# Patient Record
Sex: Female | Born: 1970 | Race: White | Hispanic: Yes | Marital: Single | Smoking: Current some day smoker
Health system: Southern US, Community
[De-identification: ages and names within clinical notes are randomized; demographics above are authoritative.]

## PROBLEM LIST (undated history)

## (undated) DIAGNOSIS — F32A Depression, unspecified: Secondary | ICD-10-CM

## (undated) DIAGNOSIS — C50919 Malignant neoplasm of unspecified site of unspecified female breast: Secondary | ICD-10-CM

## (undated) DIAGNOSIS — A920 Chikungunya virus disease: Secondary | ICD-10-CM

## (undated) DIAGNOSIS — Z923 Personal history of irradiation: Secondary | ICD-10-CM

## (undated) DIAGNOSIS — J302 Other seasonal allergic rhinitis: Secondary | ICD-10-CM

## (undated) DIAGNOSIS — C50212 Malignant neoplasm of upper-inner quadrant of left female breast: Principal | ICD-10-CM

## (undated) DIAGNOSIS — M791 Myalgia, unspecified site: Secondary | ICD-10-CM

## (undated) DIAGNOSIS — F419 Anxiety disorder, unspecified: Secondary | ICD-10-CM

## (undated) DIAGNOSIS — F329 Major depressive disorder, single episode, unspecified: Secondary | ICD-10-CM

## (undated) HISTORY — DX: Depression, unspecified: F32.A

## (undated) HISTORY — PX: TUBAL LIGATION: SHX77

## (undated) HISTORY — DX: Malignant neoplasm of unspecified site of unspecified female breast: C50.919

## (undated) HISTORY — DX: Major depressive disorder, single episode, unspecified: F32.9

## (undated) HISTORY — PX: ABDOMINAL HYSTERECTOMY: SHX81

## (undated) HISTORY — DX: Anxiety disorder, unspecified: F41.9

## (undated) HISTORY — DX: Malignant neoplasm of upper-inner quadrant of left female breast: C50.212

---

## 2004-12-24 ENCOUNTER — Emergency Department (HOSPITAL_COMMUNITY): Admission: EM | Admit: 2004-12-24 | Discharge: 2004-12-24 | Payer: Self-pay | Admitting: Emergency Medicine

## 2007-03-06 ENCOUNTER — Emergency Department (HOSPITAL_COMMUNITY): Admission: EM | Admit: 2007-03-06 | Discharge: 2007-03-06 | Payer: Self-pay | Admitting: Emergency Medicine

## 2009-05-30 ENCOUNTER — Emergency Department (HOSPITAL_COMMUNITY): Admission: EM | Admit: 2009-05-30 | Discharge: 2009-05-30 | Payer: Self-pay | Admitting: Emergency Medicine

## 2009-06-15 ENCOUNTER — Ambulatory Visit (HOSPITAL_COMMUNITY): Admission: RE | Admit: 2009-06-15 | Discharge: 2009-06-15 | Payer: Self-pay | Admitting: Obstetrics

## 2009-07-23 ENCOUNTER — Emergency Department (HOSPITAL_COMMUNITY): Admission: EM | Admit: 2009-07-23 | Discharge: 2009-07-23 | Payer: Self-pay | Admitting: Emergency Medicine

## 2009-08-04 ENCOUNTER — Inpatient Hospital Stay (HOSPITAL_COMMUNITY): Admission: RE | Admit: 2009-08-04 | Discharge: 2009-08-07 | Payer: Self-pay | Admitting: Obstetrics

## 2009-08-04 ENCOUNTER — Encounter: Payer: Self-pay | Admitting: Obstetrics

## 2009-08-29 ENCOUNTER — Emergency Department (HOSPITAL_COMMUNITY): Admission: EM | Admit: 2009-08-29 | Discharge: 2009-08-29 | Payer: Self-pay | Admitting: Emergency Medicine

## 2010-09-02 ENCOUNTER — Emergency Department (HOSPITAL_COMMUNITY)
Admission: EM | Admit: 2010-09-02 | Discharge: 2010-09-02 | Payer: Self-pay | Source: Home / Self Care | Admitting: Emergency Medicine

## 2010-10-05 ENCOUNTER — Emergency Department (HOSPITAL_COMMUNITY)
Admission: EM | Admit: 2010-10-05 | Discharge: 2010-10-05 | Payer: Self-pay | Source: Home / Self Care | Admitting: Emergency Medicine

## 2010-10-22 ENCOUNTER — Emergency Department (HOSPITAL_COMMUNITY)
Admission: EM | Admit: 2010-10-22 | Discharge: 2010-10-22 | Payer: Self-pay | Source: Home / Self Care | Admitting: Emergency Medicine

## 2010-12-23 ENCOUNTER — Emergency Department (HOSPITAL_COMMUNITY): Payer: Medicaid Other

## 2010-12-23 ENCOUNTER — Emergency Department (HOSPITAL_COMMUNITY)
Admission: EM | Admit: 2010-12-23 | Discharge: 2010-12-23 | Disposition: A | Discharge status: Home / Self Care | Payer: Medicaid Other | Attending: Emergency Medicine | Admitting: Emergency Medicine

## 2010-12-23 DIAGNOSIS — J45909 Unspecified asthma, uncomplicated: Secondary | ICD-10-CM | POA: Insufficient documentation

## 2010-12-23 DIAGNOSIS — R300 Dysuria: Secondary | ICD-10-CM | POA: Insufficient documentation

## 2010-12-23 DIAGNOSIS — R059 Cough, unspecified: Secondary | ICD-10-CM | POA: Insufficient documentation

## 2010-12-23 DIAGNOSIS — N390 Urinary tract infection, site not specified: Secondary | ICD-10-CM | POA: Insufficient documentation

## 2010-12-23 DIAGNOSIS — J029 Acute pharyngitis, unspecified: Secondary | ICD-10-CM | POA: Insufficient documentation

## 2010-12-23 DIAGNOSIS — H9209 Otalgia, unspecified ear: Secondary | ICD-10-CM | POA: Insufficient documentation

## 2010-12-23 DIAGNOSIS — J069 Acute upper respiratory infection, unspecified: Secondary | ICD-10-CM | POA: Insufficient documentation

## 2010-12-23 DIAGNOSIS — M545 Low back pain, unspecified: Secondary | ICD-10-CM | POA: Insufficient documentation

## 2010-12-23 DIAGNOSIS — J40 Bronchitis, not specified as acute or chronic: Secondary | ICD-10-CM | POA: Insufficient documentation

## 2010-12-23 DIAGNOSIS — R05 Cough: Secondary | ICD-10-CM | POA: Insufficient documentation

## 2010-12-23 LAB — URINALYSIS, ROUTINE W REFLEX MICROSCOPIC
Ketones, ur: NEGATIVE mg/dL
Nitrite: POSITIVE — AB
Specific Gravity, Urine: 1.017 (ref 1.005–1.030)
Urine Glucose, Fasting: NEGATIVE mg/dL
Urobilinogen, UA: 1 mg/dL (ref 0.0–1.0)

## 2010-12-23 LAB — URINE MICROSCOPIC-ADD ON

## 2011-01-07 LAB — URINALYSIS, ROUTINE W REFLEX MICROSCOPIC
Nitrite: NEGATIVE
Protein, ur: NEGATIVE mg/dL
pH: 5.5 (ref 5.0–8.0)

## 2011-01-07 LAB — URINE CULTURE: Culture  Setup Time: 201112261700

## 2011-01-07 LAB — URINE MICROSCOPIC-ADD ON

## 2011-01-08 LAB — URINALYSIS, ROUTINE W REFLEX MICROSCOPIC
Bilirubin Urine: NEGATIVE
Glucose, UA: NEGATIVE mg/dL
Ketones, ur: NEGATIVE mg/dL
Leukocytes, UA: NEGATIVE
Nitrite: NEGATIVE
Protein, ur: NEGATIVE mg/dL
Specific Gravity, Urine: 1.018 (ref 1.005–1.030)
Urobilinogen, UA: 1 mg/dL (ref 0.0–1.0)
pH: 6 (ref 5.0–8.0)

## 2011-01-08 LAB — URINE MICROSCOPIC-ADD ON

## 2011-01-31 LAB — CBC
HCT: 34.9 % — ABNORMAL LOW (ref 36.0–46.0)
HCT: 39.6 % (ref 36.0–46.0)
Hemoglobin: 11.9 g/dL — ABNORMAL LOW (ref 12.0–15.0)
MCHC: 33.7 g/dL (ref 30.0–36.0)
MCHC: 34.2 g/dL (ref 30.0–36.0)
MCV: 86.1 fL (ref 78.0–100.0)
MCV: 86.2 fL (ref 78.0–100.0)
Platelets: 251 10*3/uL (ref 150–400)
RBC: 4.05 MIL/uL (ref 3.87–5.11)
RBC: 4.6 MIL/uL (ref 3.87–5.11)
RDW: 13.3 % (ref 11.5–15.5)

## 2011-02-01 LAB — DIFFERENTIAL
Basophils Absolute: 0.2 10*3/uL — ABNORMAL HIGH (ref 0.0–0.1)
Eosinophils Absolute: 0.7 10*3/uL (ref 0.0–0.7)
Lymphocytes Relative: 22 % (ref 12–46)
Lymphs Abs: 1.8 10*3/uL (ref 0.7–4.0)
Monocytes Relative: 7 % (ref 3–12)
Neutro Abs: 4.8 10*3/uL (ref 1.7–7.7)
Neutrophils Relative %: 60 % (ref 43–77)

## 2011-02-01 LAB — URINE CULTURE
Colony Count: NO GROWTH
Culture: NO GROWTH

## 2011-02-01 LAB — CBC
HCT: 37.9 % (ref 36.0–46.0)
Hemoglobin: 12.9 g/dL (ref 12.0–15.0)
MCHC: 34 g/dL (ref 30.0–36.0)
MCV: 85 fL (ref 78.0–100.0)
Platelets: 246 K/uL (ref 150–400)
RBC: 4.46 MIL/uL (ref 3.87–5.11)
RDW: 13.3 % (ref 11.5–15.5)
WBC: 8 K/uL (ref 4.0–10.5)

## 2011-02-01 LAB — URINALYSIS, ROUTINE W REFLEX MICROSCOPIC
Glucose, UA: NEGATIVE mg/dL
Nitrite: NEGATIVE
Protein, ur: 100 mg/dL — AB
Specific Gravity, Urine: 1.021 (ref 1.005–1.030)
Urobilinogen, UA: 0.2 mg/dL (ref 0.0–1.0)
pH: 5.5 (ref 5.0–8.0)

## 2011-02-01 LAB — POCT I-STAT, CHEM 8
BUN: 6 mg/dL (ref 6–23)
Calcium, Ion: 1.09 mmol/L — ABNORMAL LOW (ref 1.12–1.32)
Chloride: 108 meq/L (ref 96–112)
Creatinine, Ser: 1 mg/dL (ref 0.4–1.2)
Glucose, Bld: 81 mg/dL (ref 70–99)
HCT: 38 % (ref 36.0–46.0)
Hemoglobin: 12.9 g/dL (ref 12.0–15.0)
Potassium: 4.1 meq/L (ref 3.5–5.1)
Sodium: 137 meq/L (ref 135–145)
TCO2: 21 mmol/L (ref 0–100)

## 2011-02-01 LAB — URINE MICROSCOPIC-ADD ON

## 2011-02-02 LAB — DIFFERENTIAL
Basophils Relative: 1 % (ref 0–1)
Eosinophils Absolute: 0.8 10*3/uL — ABNORMAL HIGH (ref 0.0–0.7)
Lymphs Abs: 1.5 10*3/uL (ref 0.7–4.0)
Neutrophils Relative %: 72 % (ref 43–77)

## 2011-02-02 LAB — URINALYSIS, ROUTINE W REFLEX MICROSCOPIC
Bilirubin Urine: NEGATIVE
Ketones, ur: NEGATIVE mg/dL
Protein, ur: 30 mg/dL — AB
Urobilinogen, UA: 1 mg/dL (ref 0.0–1.0)

## 2011-02-02 LAB — WET PREP, GENITAL: Yeast Wet Prep HPF POC: NONE SEEN

## 2011-02-02 LAB — CBC
MCV: 85.3 fL (ref 78.0–100.0)
Platelets: 256 10*3/uL (ref 150–400)
WBC: 10.4 10*3/uL (ref 4.0–10.5)

## 2011-02-02 LAB — PROTIME-INR
INR: 1 (ref 0.00–1.49)
Prothrombin Time: 13.1 seconds (ref 11.6–15.2)

## 2011-02-02 LAB — ABO/RH: ABO/RH(D): A POS

## 2011-02-02 LAB — URINE MICROSCOPIC-ADD ON

## 2011-03-20 ENCOUNTER — Emergency Department (HOSPITAL_COMMUNITY)
Admission: EM | Admit: 2011-03-20 | Discharge: 2011-03-21 | Disposition: A | Discharge status: Home / Self Care | Payer: Medicaid Other | Attending: Emergency Medicine | Admitting: Emergency Medicine

## 2011-03-20 ENCOUNTER — Emergency Department (HOSPITAL_COMMUNITY): Payer: Medicaid Other

## 2011-03-20 DIAGNOSIS — S4980XA Other specified injuries of shoulder and upper arm, unspecified arm, initial encounter: Secondary | ICD-10-CM | POA: Insufficient documentation

## 2011-03-20 DIAGNOSIS — IMO0002 Reserved for concepts with insufficient information to code with codable children: Secondary | ICD-10-CM | POA: Insufficient documentation

## 2011-03-20 DIAGNOSIS — S46909A Unspecified injury of unspecified muscle, fascia and tendon at shoulder and upper arm level, unspecified arm, initial encounter: Secondary | ICD-10-CM | POA: Insufficient documentation

## 2011-03-20 DIAGNOSIS — M25519 Pain in unspecified shoulder: Secondary | ICD-10-CM | POA: Insufficient documentation

## 2011-03-20 DIAGNOSIS — J45909 Unspecified asthma, uncomplicated: Secondary | ICD-10-CM | POA: Insufficient documentation

## 2011-03-20 DIAGNOSIS — M546 Pain in thoracic spine: Secondary | ICD-10-CM | POA: Insufficient documentation

## 2011-03-20 DIAGNOSIS — M62838 Other muscle spasm: Secondary | ICD-10-CM | POA: Insufficient documentation

## 2011-03-20 DIAGNOSIS — E669 Obesity, unspecified: Secondary | ICD-10-CM | POA: Insufficient documentation

## 2011-03-20 DIAGNOSIS — M542 Cervicalgia: Secondary | ICD-10-CM | POA: Insufficient documentation

## 2011-04-13 ENCOUNTER — Emergency Department (HOSPITAL_COMMUNITY)
Admission: EM | Admit: 2011-04-13 | Discharge: 2011-04-13 | Disposition: A | Discharge status: Home / Self Care | Payer: Medicaid Other | Attending: Emergency Medicine | Admitting: Emergency Medicine

## 2011-04-13 DIAGNOSIS — M25539 Pain in unspecified wrist: Secondary | ICD-10-CM | POA: Insufficient documentation

## 2011-04-13 DIAGNOSIS — J45909 Unspecified asthma, uncomplicated: Secondary | ICD-10-CM | POA: Insufficient documentation

## 2011-04-13 DIAGNOSIS — G5600 Carpal tunnel syndrome, unspecified upper limb: Secondary | ICD-10-CM | POA: Insufficient documentation

## 2011-04-13 DIAGNOSIS — R209 Unspecified disturbances of skin sensation: Secondary | ICD-10-CM | POA: Insufficient documentation

## 2011-05-09 ENCOUNTER — Emergency Department (HOSPITAL_COMMUNITY): Payer: Medicaid Other

## 2011-05-09 ENCOUNTER — Emergency Department (HOSPITAL_COMMUNITY)
Admission: EM | Admit: 2011-05-09 | Discharge: 2011-05-09 | Disposition: A | Discharge status: Home / Self Care | Payer: Medicaid Other | Attending: Emergency Medicine | Admitting: Emergency Medicine

## 2011-05-09 DIAGNOSIS — E669 Obesity, unspecified: Secondary | ICD-10-CM | POA: Insufficient documentation

## 2011-05-09 DIAGNOSIS — J45909 Unspecified asthma, uncomplicated: Secondary | ICD-10-CM | POA: Insufficient documentation

## 2011-05-09 DIAGNOSIS — M25539 Pain in unspecified wrist: Secondary | ICD-10-CM | POA: Insufficient documentation

## 2011-05-09 DIAGNOSIS — G5600 Carpal tunnel syndrome, unspecified upper limb: Secondary | ICD-10-CM | POA: Insufficient documentation

## 2011-05-25 ENCOUNTER — Emergency Department (HOSPITAL_COMMUNITY)
Admission: EM | Admit: 2011-05-25 | Discharge: 2011-05-25 | Disposition: A | Discharge status: Home / Self Care | Payer: Medicaid Other | Attending: Emergency Medicine | Admitting: Emergency Medicine

## 2011-05-25 DIAGNOSIS — R21 Rash and other nonspecific skin eruption: Secondary | ICD-10-CM | POA: Insufficient documentation

## 2011-05-25 DIAGNOSIS — J45909 Unspecified asthma, uncomplicated: Secondary | ICD-10-CM | POA: Insufficient documentation

## 2011-05-25 DIAGNOSIS — L299 Pruritus, unspecified: Secondary | ICD-10-CM | POA: Insufficient documentation

## 2011-05-25 DIAGNOSIS — R220 Localized swelling, mass and lump, head: Secondary | ICD-10-CM | POA: Insufficient documentation

## 2011-05-25 DIAGNOSIS — R52 Pain, unspecified: Secondary | ICD-10-CM | POA: Insufficient documentation

## 2011-05-25 DIAGNOSIS — T781XXA Other adverse food reactions, not elsewhere classified, initial encounter: Secondary | ICD-10-CM | POA: Insufficient documentation

## 2011-05-25 DIAGNOSIS — G8929 Other chronic pain: Secondary | ICD-10-CM | POA: Insufficient documentation

## 2011-06-25 ENCOUNTER — Emergency Department (HOSPITAL_COMMUNITY)
Admission: EM | Admit: 2011-06-25 | Discharge: 2011-06-25 | Discharge status: Against Medical Advice | Payer: Medicaid Other | Attending: Emergency Medicine | Admitting: Emergency Medicine

## 2011-06-25 DIAGNOSIS — Z0389 Encounter for observation for other suspected diseases and conditions ruled out: Secondary | ICD-10-CM | POA: Insufficient documentation

## 2011-07-16 ENCOUNTER — Emergency Department (HOSPITAL_COMMUNITY)
Admission: EM | Admit: 2011-07-16 | Discharge: 2011-07-16 | Disposition: A | Discharge status: Home / Self Care | Payer: Medicaid Other | Attending: Emergency Medicine | Admitting: Emergency Medicine

## 2011-07-16 DIAGNOSIS — G5600 Carpal tunnel syndrome, unspecified upper limb: Secondary | ICD-10-CM | POA: Insufficient documentation

## 2011-07-16 DIAGNOSIS — M25539 Pain in unspecified wrist: Secondary | ICD-10-CM | POA: Insufficient documentation

## 2011-08-06 ENCOUNTER — Emergency Department (HOSPITAL_COMMUNITY)
Admission: EM | Admit: 2011-08-06 | Discharge: 2011-08-06 | Disposition: A | Discharge status: Home / Self Care | Payer: Medicaid Other | Attending: Emergency Medicine | Admitting: Emergency Medicine

## 2011-08-06 DIAGNOSIS — M65839 Other synovitis and tenosynovitis, unspecified forearm: Secondary | ICD-10-CM | POA: Insufficient documentation

## 2011-08-06 DIAGNOSIS — R209 Unspecified disturbances of skin sensation: Secondary | ICD-10-CM | POA: Insufficient documentation

## 2011-08-06 DIAGNOSIS — M25539 Pain in unspecified wrist: Secondary | ICD-10-CM | POA: Insufficient documentation

## 2011-08-06 DIAGNOSIS — J45909 Unspecified asthma, uncomplicated: Secondary | ICD-10-CM | POA: Insufficient documentation

## 2011-08-06 DIAGNOSIS — G5600 Carpal tunnel syndrome, unspecified upper limb: Secondary | ICD-10-CM | POA: Insufficient documentation

## 2011-10-10 ENCOUNTER — Encounter: Payer: Self-pay | Admitting: Nurse Practitioner

## 2011-10-10 ENCOUNTER — Emergency Department (HOSPITAL_COMMUNITY)
Admission: EM | Admit: 2011-10-10 | Discharge: 2011-10-10 | Disposition: A | Discharge status: Home / Self Care | Payer: Medicaid Other | Attending: Emergency Medicine | Admitting: Emergency Medicine

## 2011-10-10 DIAGNOSIS — K089 Disorder of teeth and supporting structures, unspecified: Secondary | ICD-10-CM | POA: Insufficient documentation

## 2011-10-10 DIAGNOSIS — J45909 Unspecified asthma, uncomplicated: Secondary | ICD-10-CM | POA: Insufficient documentation

## 2011-10-10 DIAGNOSIS — K029 Dental caries, unspecified: Secondary | ICD-10-CM | POA: Insufficient documentation

## 2011-10-10 DIAGNOSIS — K0889 Other specified disorders of teeth and supporting structures: Secondary | ICD-10-CM

## 2011-10-10 MED ORDER — ACETAMINOPHEN-CODEINE 120-12 MG/5ML PO SUSP: 5.0000 mL | Freq: Four times a day (QID) | ORAL | Status: AC | PRN

## 2011-10-10 MED ORDER — OXYCODONE-ACETAMINOPHEN 5-325 MG PO TABS
1.0000 | ORAL_TABLET | Freq: Once | ORAL | Status: AC
  Administered 2011-10-10: 1 via ORAL
  Filled 2011-10-10: qty 1

## 2011-10-10 MED ORDER — PENICILLIN V POTASSIUM 500 MG PO TABS: 500.0000 mg | ORAL_TABLET | Freq: Four times a day (QID) | ORAL | Status: AC

## 2011-10-10 NOTE — ED Notes (Signed)
States she was eating 2 days ago and part of upper back L tooth chipped off, increasing pain since

## 2011-10-10 NOTE — ED Provider Notes (Signed)
History    this is a 40 year old female presenting to the ED with chief complaints of dental pain. Patient states while eating yesterday she experiencing a sharp pain to the right upper canine tooth. Patient felt she may have broken a piece of the tooth off. Has noticed increasing pain with hot and cold water. She is taking over-the-counter ibuprofen and Tylenol without relief. She denies hearing changes, fever, sore throat, chest pain, shortness of breath, rash.  CSN: [id number] Arrival date & time: 10/10/2011  1:41 PM   None     Chief Complaint  Patient presents with  . Dental Pain    (Consider location/radiation/quality/duration/timing/severity/associated sxs/prior treatment) HPI  Past Medical History  Diagnosis Date  . Asthma     Past Surgical History  Procedure Date  . Abdominal hysterectomy     History reviewed. No pertinent family history.  History  Substance Use Topics  . Smoking status: Current Everyday Smoker -- 1.5 packs/day  . Smokeless tobacco: Not on file  . Alcohol Use: Yes     rare    OB History    Grav Para Term Preterm Abortions TAB SAB Ect Mult Living                  Review of Systems  All other systems reviewed and are negative.    Allergies  Review of patient's allergies indicates no known allergies.  Home Medications   Current Outpatient Rx  Name Route Sig Dispense Refill  . ACETAMINOPHEN 500 MG PO TABS Oral Take 500 mg by mouth every 6 (six) hours as needed. For pain     . ALBUTEROL SULFATE HFA 108 (90 BASE) MCG/ACT IN AERS Inhalation Inhale 2 puffs into the lungs every 6 (six) hours as needed. For wheezing     . IBUPROFEN 600 MG PO TABS Oral Take 1,200 mg by mouth every 6 (six) hours as needed. For pain     . RANITIDINE HCL 75 MG PO TABS Oral Take 75 mg by mouth 2 (two) times daily as needed. For acid reflux       BP 112/77  Pulse 86  Temp(Src) 98 F (36.7 C) (Oral)  Resp 19  Ht 5\' 2"  (1.575 m)  Wt 210 lb (95.255 kg)  BMI  38.41 kg/m2  SpO2 98%  Physical Exam  Constitutional: She appears well-developed and well-nourished.  HENT:  Right Ear: Hearing normal. No middle ear effusion.  Left Ear: Hearing normal.  No middle ear effusion.  Mouth/Throat:    Eyes: Conjunctivae are normal.  Neck: Normal range of motion. Neck supple.  Cardiovascular: Normal rate and regular rhythm.   Pulmonary/Chest: Effort normal and breath sounds normal.  Lymphadenopathy:    She has no cervical adenopathy.  Neurological: She is alert.  Skin: Skin is warm.  Psychiatric: She has a normal mood and affect.    ED Course  Procedures (including critical care time)  Labs Reviewed - No data to display No results found.   No diagnosis found.    MDM  Dental pain secondary to dental decay. No obvious dental fracture notes. No evidence of abscess noted. Patient will be receiving antibiotic and pain medication and a referral to dentist.        Fayrene Helper, PA 10/10/11 1537

## 2011-10-10 NOTE — ED Provider Notes (Signed)
Medical screening examination/treatment/procedure(s) were performed by non-physician practitioner and as supervising physician I was immediately available for consultation/collaboration.  Flint Melter, MD 10/10/11 [phone]

## 2011-10-10 NOTE — ED Notes (Signed)
Pt requests pain med at time of discharge. Pa made aware

## 2011-10-10 NOTE — Discharge Instructions (Signed)

## 2011-11-11 ENCOUNTER — Encounter (HOSPITAL_COMMUNITY): Payer: Self-pay | Admitting: *Deleted

## 2011-11-11 ENCOUNTER — Emergency Department (HOSPITAL_COMMUNITY): Payer: Medicaid Other

## 2011-11-11 ENCOUNTER — Other Ambulatory Visit: Payer: Self-pay

## 2011-11-11 ENCOUNTER — Emergency Department (HOSPITAL_COMMUNITY)
Admission: EM | Admit: 2011-11-11 | Discharge: 2011-11-11 | Disposition: A | Discharge status: Home / Self Care | Payer: Medicaid Other | Attending: Emergency Medicine | Admitting: Emergency Medicine

## 2011-11-11 DIAGNOSIS — R5381 Other malaise: Secondary | ICD-10-CM

## 2011-11-11 DIAGNOSIS — R5383 Other fatigue: Secondary | ICD-10-CM

## 2011-11-11 DIAGNOSIS — R079 Chest pain, unspecified: Secondary | ICD-10-CM | POA: Insufficient documentation

## 2011-11-11 DIAGNOSIS — R0602 Shortness of breath: Secondary | ICD-10-CM | POA: Insufficient documentation

## 2011-11-11 DIAGNOSIS — R112 Nausea with vomiting, unspecified: Secondary | ICD-10-CM | POA: Insufficient documentation

## 2011-11-11 DIAGNOSIS — N390 Urinary tract infection, site not specified: Secondary | ICD-10-CM

## 2011-11-11 DIAGNOSIS — F17200 Nicotine dependence, unspecified, uncomplicated: Secondary | ICD-10-CM | POA: Insufficient documentation

## 2011-11-11 LAB — URINALYSIS, ROUTINE W REFLEX MICROSCOPIC
Bilirubin Urine: NEGATIVE
Glucose, UA: NEGATIVE mg/dL
Hgb urine dipstick: NEGATIVE
Ketones, ur: NEGATIVE mg/dL
Nitrite: POSITIVE — AB
Protein, ur: NEGATIVE mg/dL
Specific Gravity, Urine: 1.014 (ref 1.005–1.030)
Urobilinogen, UA: 1 mg/dL (ref 0.0–1.0)
pH: 7 (ref 5.0–8.0)

## 2011-11-11 LAB — CBC
HCT: 36.8 % (ref 36.0–46.0)
Hemoglobin: 12.7 g/dL (ref 12.0–15.0)
MCH: 27.9 pg (ref 26.0–34.0)
MCHC: 34.5 g/dL (ref 30.0–36.0)
MCV: 80.7 fL (ref 78.0–100.0)
Platelets: 256 10*3/uL (ref 150–400)
RBC: 4.56 MIL/uL (ref 3.87–5.11)
RDW: 12.3 % (ref 11.5–15.5)
WBC: 7.4 10*3/uL (ref 4.0–10.5)

## 2011-11-11 LAB — TROPONIN I: Troponin I: <0.3 ng/mL (ref <0.30)

## 2011-11-11 LAB — URINE MICROSCOPIC-ADD ON

## 2011-11-11 MED ORDER — MORPHINE SULFATE 4 MG/ML IJ SOLN
4.0000 mg | Freq: Once | INTRAMUSCULAR | Status: AC
  Administered 2011-11-11: 4 mg via INTRAVENOUS
  Filled 2011-11-11: qty 1

## 2011-11-11 MED ORDER — ONDANSETRON HCL 4 MG/2ML IJ SOLN
4.0000 mg | Freq: Once | INTRAMUSCULAR | Status: AC
  Administered 2011-11-11: 4 mg via INTRAVENOUS
  Filled 2011-11-11: qty 2

## 2011-11-11 MED ORDER — SULFAMETHOXAZOLE-TRIMETHOPRIM 800-160 MG PO TABS: 1.0000 | ORAL_TABLET | Freq: Two times a day (BID) | ORAL | Status: AC

## 2011-11-11 MED ORDER — DEXTROSE 5 % IV SOLN
1.0000 g | Freq: Once | INTRAVENOUS | Status: AC
  Administered 2011-11-11: 1 g via INTRAVENOUS
  Filled 2011-11-11: qty 10

## 2011-11-11 MED ORDER — SODIUM CHLORIDE 0.9 % IV BOLUS (SEPSIS)
1000.0000 mL | Freq: Once | INTRAVENOUS | Status: AC
  Administered 2011-11-11: 1000 mL via INTRAVENOUS

## 2011-11-11 MED ORDER — IBUPROFEN 200 MG PO TABS
400.0000 mg | ORAL_TABLET | Freq: Once | ORAL | Status: AC
  Administered 2011-11-11: 400 mg via ORAL
  Filled 2011-11-11: qty 2

## 2011-11-11 NOTE — ED Provider Notes (Signed)
History    40yF with multiple complaints. General malaise. Gradual onset 2d ago. Nausea and vomiting. 2x yesterday and 2x today. NBNB. SOB. Brief intermittent CP. Substernal and lasts seconds. No radiation. Seems to be brought on when laying supine. No Hx of asthma but says feels different. Coughing. No sick contacts. Dysuria for past couple days as well. No other urinary complaints. Denies abdominal pain.  CSN: [id number]  Arrival date & time 11/11/11  1448   First MD Initiated Contact with Patient 11/11/11 1503      Chief Complaint  Patient presents with  . Chest Pain  . Nausea  . Emesis    (Consider location/radiation/quality/duration/timing/severity/associated sxs/prior treatment) HPI  Past Medical History  Diagnosis Date  . Asthma     Past Surgical History  Procedure Date  . Abdominal hysterectomy     History reviewed. No pertinent family history.  History  Substance Use Topics  . Smoking status: Current Everyday Smoker -- 1.5 packs/day  . Smokeless tobacco: Not on file  . Alcohol Use: Yes     rare    OB History    Grav Para Term Preterm Abortions TAB SAB Ect Mult Living                  Review of Systems   Review of symptoms negative unless otherwise noted in HPI.   Allergies  Review of patient's allergies indicates no known allergies.  Home Medications   Current Outpatient Rx  Name Route Sig Dispense Refill  . ACETAMINOPHEN 500 MG PO TABS Oral Take 500 mg by mouth every 6 (six) hours as needed. For pain     . ALBUTEROL SULFATE HFA 108 (90 BASE) MCG/ACT IN AERS Inhalation Inhale 2 puffs into the lungs every 6 (six) hours as needed. For wheezing     . IBUPROFEN 600 MG PO TABS Oral Take 1,200 mg by mouth every 6 (six) hours as needed. For pain     . RANITIDINE HCL 75 MG PO TABS Oral Take 75 mg by mouth 2 (two) times daily as needed. For acid reflux       BP 93/73  Pulse 74  Temp(Src) 98.1 F (36.7 C) (Oral)  Resp 24  SpO2 100%  Physical Exam    Nursing note and vitals reviewed. Constitutional: She appears well-developed and well-nourished. No distress.  HENT:  Head: Normocephalic and atraumatic.  Eyes: Conjunctivae are normal. Right eye exhibits no discharge. Left eye exhibits no discharge.  Neck: Neck supple.  Cardiovascular: Normal rate, regular rhythm and normal heart sounds.  Exam reveals no gallop and no friction rub.   No murmur heard. Pulmonary/Chest: Effort normal and breath sounds normal. No respiratory distress.  Abdominal: Soft. She exhibits no distension. There is no tenderness.  Musculoskeletal: She exhibits no edema and no tenderness.  Neurological: She is alert.  Skin: Skin is warm and dry.  Psychiatric: She has a normal mood and affect. Her behavior is normal. Thought content normal.    ED Course  Procedures (including critical care time)  Labs Reviewed  URINALYSIS, ROUTINE W REFLEX MICROSCOPIC - Abnormal; Notable for the following:    APPearance CLOUDY (*)    Nitrite POSITIVE (*)    Leukocytes, UA TRACE (*)    All other components within normal limits  URINE MICROSCOPIC-ADD ON - Abnormal; Notable for the following:    Squamous Epithelial / LPF FEW (*)    Bacteria, UA MANY (*)    All other components within normal limits  TROPONIN I  CBC   Dg Chest 2 View  11/11/2011  *RADIOLOGY REPORT*  Clinical Data: Cough, chest pain, shortness of breath, history smoking, asthma  CHEST - 2 VIEW  Comparison: 12/23/2010  Findings: Normal heart size, mediastinal contours, and pulmonary vascularity. Decreased peribronchial thickening versus previous study. Lungs clear. No pleural effusion or pneumothorax. No acute osseous abnormalities.  IMPRESSION: No acute abnormalities.  Original Report Authenticated By: Lollie Marrow, M.D.   EKG:  Rhythm: normal sinus Rate: 68 Axis: normal Intervals/Conduction: low voltage, suspect likely 2/2 body habitus ST segments: normal   1. UTI (urinary tract infection)   2. Other  malaise and fatigue       MDM  40yF with multiple complaints. General malaise, CP, dysuria. Dysuria most likely from UTI. Dose IV abx in ED and Dc with continued. Clinically doubt pylonephritis. No abdominal/flank/back pain. Afebrile. Some vomiting, but none during ED stay. Non toxic. Possible also viral illness as etiology of additional symptoms. Also consider ACS, PE, pneumonia, asthma exacerbation. Atypical pain for ACS. EKG with no concerning changes and trop wnl. Feel this is very low risk CP. Clinically doubt PE and low probability based on Well's criteria. CXR with no focal infiltrate. Plan symptomatic care. Return precautions discussed. outpt fu.        Raeford Razor, MD 11/19/11 1249

## 2011-11-11 NOTE — ED Notes (Signed)
Pt reports sick with N/V for last two days and shortness of breath. Today shortness of breath and chest tightness. Lungs CTAB.  Pt reports productive cough with yellow sputum. Pt has history of asthma.

## 2011-11-11 NOTE — ED Notes (Signed)
[id number]<AV> Expected date:<BR> Expected time:<BR> Means of arrival:Ambulance<BR> Comments:<BR> 41 yo F Nausea, chest tightness, sob

## 2011-11-11 NOTE — ED Notes (Signed)
Patient aware of need for urine testing. Patient unable to void at this time. Patient encouraged to call for toileting assistance. Patient is drinking water given from RN and is getting fluids.

## 2011-12-04 ENCOUNTER — Emergency Department (HOSPITAL_COMMUNITY)
Admission: EM | Admit: 2011-12-04 | Discharge: 2011-12-04 | Disposition: A | Discharge status: Home / Self Care | Payer: Medicaid Other | Attending: Emergency Medicine | Admitting: Emergency Medicine

## 2011-12-04 ENCOUNTER — Emergency Department (HOSPITAL_COMMUNITY): Payer: Medicaid Other

## 2011-12-04 ENCOUNTER — Other Ambulatory Visit: Payer: Self-pay

## 2011-12-04 ENCOUNTER — Encounter (HOSPITAL_COMMUNITY): Payer: Self-pay | Admitting: *Deleted

## 2011-12-04 DIAGNOSIS — R0602 Shortness of breath: Secondary | ICD-10-CM | POA: Insufficient documentation

## 2011-12-04 DIAGNOSIS — J45901 Unspecified asthma with (acute) exacerbation: Secondary | ICD-10-CM | POA: Insufficient documentation

## 2011-12-04 MED ORDER — ALBUTEROL SULFATE (5 MG/ML) 0.5% IN NEBU
10.0000 mg | INHALATION_SOLUTION | Freq: Once | RESPIRATORY_TRACT | Status: AC
  Administered 2011-12-04: 10 mg via RESPIRATORY_TRACT
  Filled 2011-12-04: qty 0.5

## 2011-12-04 MED ORDER — METHYLPREDNISOLONE SODIUM SUCC 125 MG IJ SOLR
125.0000 mg | Freq: Once | INTRAMUSCULAR | Status: AC
  Administered 2011-12-04: 125 mg via INTRAVENOUS
  Filled 2011-12-04: qty 2

## 2011-12-04 MED ORDER — PREDNISONE 20 MG PO TABS: 40.0000 mg | ORAL_TABLET | Freq: Every day | ORAL | Status: AC

## 2011-12-04 MED ORDER — GUAIFENESIN-CODEINE 100-10 MG/5ML PO SYRP: 5.0000 mL | ORAL_SOLUTION | Freq: Three times a day (TID) | ORAL | Status: AC | PRN

## 2011-12-04 NOTE — ED Notes (Signed)
Lung sounds with exp wheezes.  Pt states sternal "burning" chest pain that started after she began coughing.

## 2011-12-04 NOTE — ED Notes (Signed)
Pt with improved lung sounds bil - decreased wheezes and decreased cough noted.  Pt states she feels breathing is easier.

## 2011-12-04 NOTE — ED Notes (Signed)
Patient transported to X-ray 

## 2011-12-04 NOTE — ED Notes (Signed)
RT notified of pt.

## 2011-12-04 NOTE — ED Notes (Signed)
Pt with hx of asthma (and is a smoker) to ED via EMS c/o cough since Friday.  Cough worse today to where she can't eat w/out throwing up b/c she is coughing so much.  Denies fevers.  She states "I feel like I have concrete in my lungs".  VS stable.  Given 5 albuterol enroute.  20 G SL to wrist.

## 2011-12-04 NOTE — ED Notes (Signed)
RT at bedside.

## 2011-12-11 NOTE — ED Provider Notes (Signed)
History    41 year old female with dyspnea. Gradual onset about 2 days ago. Persistent since. Denies chest pain. No fevers or chills. Nonproductive cough. Patient states she feels like she has "concrete in my lungs." Denies history of blood clot. No unusual leg pain or swelling. Patient does have history of asthma and is also smoker. Has had a couple episodes of posttussive emesis.  CSN: [id number]  Arrival date & time 12/04/11  0272   First MD Initiated Contact with Patient 12/04/11 [phone]      Chief Complaint  Patient presents with  . Shortness of Breath    (Consider location/radiation/quality/duration/timing/severity/associated sxs/prior treatment) HPI  Past Medical History  Diagnosis Date  . Asthma     Past Surgical History  Procedure Date  . Abdominal hysterectomy     No family history on file.  History  Substance Use Topics  . Smoking status: Current Everyday Smoker -- 1.5 packs/day  . Smokeless tobacco: Not on file  . Alcohol Use: Not on file     rare    OB History    Grav Para Term Preterm Abortions TAB SAB Ect Mult Living                  Review of Systems   Review of symptoms negative unless otherwise noted in HPI.   Allergies  Review of patient's allergies indicates no known allergies.  Home Medications   Current Outpatient Rx  Name Route Sig Dispense Refill  . ACETAMINOPHEN 500 MG PO TABS Oral Take 500 mg by mouth every 6 (six) hours as needed. For pain     . ALBUTEROL SULFATE HFA 108 (90 BASE) MCG/ACT IN AERS Inhalation Inhale 2 puffs into the lungs every 6 (six) hours as needed. For wheezing     . IBUPROFEN 600 MG PO TABS Oral Take 1,200 mg by mouth every 6 (six) hours as needed. For pain     . RANITIDINE HCL 75 MG PO TABS Oral Take 75 mg by mouth 2 (two) times daily as needed. For acid reflux     . GUAIFENESIN-CODEINE 100-10 MG/5ML PO SYRP Oral Take 5 mLs by mouth 3 (three) times daily as needed for cough. 120 mL 0  . PREDNISONE 20 MG PO TABS  Oral Take 2 tablets (40 mg total) by mouth daily. 10 tablet 0    BP 121/63  Pulse 102  Temp(Src) 98.5 F (36.9 C) (Oral)  Resp 13  SpO2 100%  Physical Exam  Nursing note and vitals reviewed. Constitutional: She appears well-developed and well-nourished. No distress.  HENT:  Head: Normocephalic and atraumatic.  Eyes: Conjunctivae are normal. Right eye exhibits no discharge. Left eye exhibits no discharge.  Neck: Normal range of motion. Neck supple.  Cardiovascular: Normal rate, regular rhythm and normal heart sounds.  Exam reveals no gallop and no friction rub.   No murmur heard. Pulmonary/Chest: Effort normal. No respiratory distress.       Mild end expiratory wheezing bilaterally.  Abdominal: Soft. She exhibits no distension. There is no tenderness.  Musculoskeletal: She exhibits no edema and no tenderness.  Neurological: She is alert.  Skin: Skin is warm and dry.  Psychiatric: She has a normal mood and affect. Her behavior is normal. Thought content normal.    ED Course  Procedures (including critical care time)  Labs Reviewed - No data to display No results found.  Dg Chest 2 View  12/04/2011  *RADIOLOGY REPORT*  Clinical Data: Cough, shortness of breath, chest pain.  CHEST - 2 VIEW  Comparison: 11/11/2011  Findings: 6 mm nodular opacity projecting over the left lower lobe is only seen on the PA view and not appreciated on the recent comparison.  Otherwise, the lungs are clear. No pleural effusion or pneumothorax. The cardiomediastinal contours are within normal limits. The visualized bones and soft tissues are without significant appreciable abnormality.  IMPRESSION: No acute cardiopulmonary process.  6 mm nodular opacity projecting over the left lower lobe is only seen on the PA view and not appreciated on the recent comparison. Therefore, may be secondary to superimposed shadows. Attention on 6 month follow-up.  Original Report Authenticated By: Waneta Martins, M.D.   1.  Asthma exacerbation       MDM  41 year old female with dyspnea. Clinically mild asthma exacerbation. Patient with no significant respiratory distress on exam. Patient with no hypoxia on room air. Patient reports subjective improvement of symptoms objectively sounds better after neb treatment. Chest x-ray does not show any focal consolidation. outpt fu as needed.       Raeford Razor, MD 12/11/11 [phone]

## 2011-12-21 ENCOUNTER — Emergency Department (HOSPITAL_COMMUNITY)
Admission: EM | Admit: 2011-12-21 | Discharge: 2011-12-21 | Disposition: A | Discharge status: Home / Self Care | Payer: Medicaid Other | Attending: Emergency Medicine | Admitting: Emergency Medicine

## 2011-12-21 ENCOUNTER — Emergency Department (HOSPITAL_COMMUNITY): Payer: Medicaid Other

## 2011-12-21 ENCOUNTER — Encounter (HOSPITAL_COMMUNITY): Payer: Self-pay

## 2011-12-21 DIAGNOSIS — R059 Cough, unspecified: Secondary | ICD-10-CM | POA: Insufficient documentation

## 2011-12-21 DIAGNOSIS — J45909 Unspecified asthma, uncomplicated: Secondary | ICD-10-CM | POA: Insufficient documentation

## 2011-12-21 DIAGNOSIS — R079 Chest pain, unspecified: Secondary | ICD-10-CM | POA: Insufficient documentation

## 2011-12-21 DIAGNOSIS — R0602 Shortness of breath: Secondary | ICD-10-CM | POA: Insufficient documentation

## 2011-12-21 DIAGNOSIS — R05 Cough: Secondary | ICD-10-CM | POA: Insufficient documentation

## 2011-12-21 MED ORDER — GUAIFENESIN-CODEINE 100-10 MG/5ML PO SYRP: 5.0000 mL | ORAL_SOLUTION | Freq: Three times a day (TID) | ORAL | Status: AC | PRN

## 2011-12-21 MED ORDER — PREDNISONE 20 MG PO TABS
60.0000 mg | ORAL_TABLET | Freq: Once | ORAL | Status: AC
  Administered 2011-12-21: 60 mg via ORAL
  Filled 2011-12-21: qty 3

## 2011-12-21 MED ORDER — ALBUTEROL SULFATE HFA 108 (90 BASE) MCG/ACT IN AERS
1.0000 | INHALATION_SPRAY | Freq: Once | RESPIRATORY_TRACT | Status: AC
  Administered 2011-12-21: 1 via RESPIRATORY_TRACT
  Filled 2011-12-21: qty 6.7

## 2011-12-21 MED ORDER — PREDNISONE 10 MG PO TABS: 20.0000 mg | ORAL_TABLET | Freq: Every day | ORAL | Status: DC

## 2011-12-21 MED ORDER — FEXOFENADINE-PSEUDOEPHED ER 60-120 MG PO TB12: 1.0000 | ORAL_TABLET | Freq: Two times a day (BID) | ORAL | Status: DC

## 2011-12-21 MED ORDER — IPRATROPIUM BROMIDE 0.02 % IN SOLN
0.5000 mg | Freq: Once | RESPIRATORY_TRACT | Status: AC
Start: 2011-12-21 — End: 2011-12-21
  Administered 2011-12-21: 0.5 mg via RESPIRATORY_TRACT
  Filled 2011-12-21: qty 2.5

## 2011-12-21 MED ORDER — ALBUTEROL SULFATE (5 MG/ML) 0.5% IN NEBU
5.0000 mg | INHALATION_SOLUTION | Freq: Once | RESPIRATORY_TRACT | Status: AC
  Administered 2011-12-21: 5 mg via RESPIRATORY_TRACT
  Filled 2011-12-21: qty 1

## 2011-12-21 NOTE — Discharge Instructions (Signed)
Use albuterol inhaler, 2 puffs every 4 hours, as needed for cough and shortness of breath.  Take prednisone and allegra-D as prescribed.  The allegra has sudafed in it to help with your nasal congestion.  Follow up with your new family doctor as soon as possible.  You will likely need to be started on an inhaled steroid to better control your asthma symptoms.  Cut back on smoking as much as possible.   You may return to the ER if symptoms worsen or you have any other concerns.   Asthma Attack Prevention HOW CAN ASTHMA BE PREVENTED? Currently, there is no way to prevent asthma from starting. However, you can take steps to control the disease and prevent its symptoms after you have been diagnosed. Learn about your asthma and how to control it. Take an active role to control your asthma by working with your caregiver to create and follow an asthma action plan. An asthma action plan guides you in taking your medicines properly, avoiding factors that make your asthma worse, tracking your level of asthma control, responding to worsening asthma, and seeking emergency care when needed. To track your asthma, keep records of your symptoms, check your peak flow number using a peak flow meter (handheld device that shows how well air moves out of your lungs), and get regular asthma checkups.  Other ways to prevent asthma attacks include:  Use medicines as your caregiver directs.   Identify and avoid things that make your asthma worse (as much as you can).   Keep track of your asthma symptoms and level of control.   Get regular checkups for your asthma.   With your caregiver, write a detailed plan for taking medicines and managing an asthma attack. Then be sure to follow your action plan. Asthma is an ongoing condition that needs regular monitoring and treatment.   Identify and avoid asthma triggers. A number of outdoor allergens and irritants (pollen, mold, cold air, air pollution) can trigger asthma attacks.  Find out what causes or makes your asthma worse, and take steps to avoid those triggers (see below).   Monitor your breathing. Learn to recognize warning signs of an attack, such as slight coughing, wheezing or shortness of breath. However, your lung function may already decrease before you notice any signs or symptoms, so regularly measure and record your peak airflow with a home peak flow meter.   Identify and treat attacks early. If you act quickly, you're less likely to have a severe attack. You will also need less medicine to control your symptoms. When your peak flow measurements decrease and alert you to an upcoming attack, take your medicine as instructed, and immediately stop any activity that may have triggered the attack. If your symptoms do not improve, get medical help.   Pay attention to increasing quick-relief inhaler use. If you find yourself relying on your quick-relief inhaler (such as albuterol), your asthma is not under control. See your caregiver about adjusting your treatment.  IDENTIFY AND CONTROL FACTORS THAT MAKE YOUR ASTHMA WORSE A number of common things can set off or make your asthma symptoms worse (asthma triggers). Keep track of your asthma symptoms for several weeks, detailing all the environmental and emotional factors that are linked with your asthma. When you have an asthma attack, go back to your asthma diary to see which factor, or combination of factors, might have contributed to it. Once you know what these factors are, you can take steps to control many of them.  Allergies: If you have allergies and asthma, it is important to take asthma prevention steps at home. Asthma attacks (worsening of asthma symptoms) can be triggered by allergies, which can cause temporary increased inflammation of your airways. Minimizing contact with the substance to which you are allergic will help prevent an asthma attack. Animal Dander:   Some people are allergic to the flakes of skin  or dried saliva from animals with fur or feathers. Keep these pets out of your home.   If you can't keep a pet outdoors, keep the pet out of your bedroom and other sleeping areas at all times, and keep the door closed.   Remove carpets and furniture covered with cloth from your home. If that is not possible, keep the pet away from fabric-covered furniture and carpets.  Dust Mites:  Many people with asthma are allergic to dust mites. Dust mites are tiny bugs that are found in every home, in mattresses, pillows, carpets, fabric-covered furniture, bedcovers, clothes, stuffed toys, fabric, and other fabric-covered items.   Cover your mattress in a special dust-proof cover.   Cover your pillow in a special dust-proof cover, or wash the pillow each week in hot water. Water must be hotter than 130 F to kill dust mites. Cold or warm water used with detergent and bleach can also be effective.   Wash the sheets and blankets on your bed each week in hot water.   Try not to sleep or lie on cloth-covered cushions.   Call ahead when traveling and ask for a smoke-free hotel room. Bring your own bedding and pillows, in case the hotel only supplies feather pillows and down comforters, which may contain dust mites and cause asthma symptoms.   Remove carpets from your bedroom and those laid on concrete, if you can.   Keep stuffed toys out of the bed, or wash the toys weekly in hot water or cooler water with detergent and bleach.  Cockroaches:  Many people with asthma are allergic to the droppings and remains of cockroaches.   Keep food and garbage in closed containers. Never leave food out.   Use poison baits, traps, powders, gels, or paste (for example, boric acid).   If a spray is used to kill cockroaches, stay out of the room until the odor goes away.  Indoor Mold:  Fix leaky faucets, pipes, or other sources of water that have mold around them.   Clean moldy surfaces with a cleaner that has  bleach in it.  Pollen and Outdoor Mold:  When pollen or mold spore counts are high, try to keep your windows closed.   Stay indoors with windows closed from late morning to afternoon, if you can. Pollen and some mold spore counts are highest at that time.   Ask your caregiver whether you need to take or increase anti-inflammatory medicine before your allergy season starts.  Irritants:   Tobacco smoke is an irritant. If you smoke, ask your caregiver how you can quit. Ask family members to quit smoking, too. Do not allow smoking in your home or car.   If possible, do not use a wood-burning stove, kerosene heater, or fireplace. Minimize exposure to all sources of smoke, including incense, candles, fires, and fireworks.   Try to stay away from strong odors and sprays, such as perfume, talcum powder, hair spray, and paints.   Decrease humidity in your home and use an indoor air cleaning device. Reduce indoor humidity to below 60 percent. Dehumidifiers or central air  conditioners can do this.   Try to have someone else vacuum for you once or twice a week, if you can. Stay out of rooms while they are being vacuumed and for a short while afterward.   If you vacuum, use a dust mask from a hardware store, a double-layered or microfilter vacuum cleaner bag, or a vacuum cleaner with a HEPA filter.   Sulfites in foods and beverages can be irritants. Do not drink beer or wine, or eat dried fruit, processed potatoes, or shrimp if they cause asthma symptoms.   Cold air can trigger an asthma attack. Cover your nose and mouth with a scarf on cold or windy days.   Several health conditions can make asthma more difficult to manage, including runny nose, sinus infections, reflux disease, psychological stress, and sleep apnea. Your caregiver will treat these conditions, as well.   Avoid close contact with people who have a cold or the flu, since your asthma symptoms may get worse if you catch the infection  from them. Wash your hands thoroughly after touching items that may have been handled by people with a respiratory infection.   Get a flu shot every year to protect against the flu virus, which often makes asthma worse for days or weeks. Also get a pneumonia shot once every five to 10 years.  Drugs:  Aspirin and other painkillers can cause asthma attacks. 10% to 20% of people with asthma have sensitivity to aspirin or a group of painkillers called non-steroidal anti-inflammatory drugs (NSAIDS), such as ibuprofen and naproxen. These drugs are used to treat pain and reduce fevers. Asthma attacks caused by any of these medicines can be severe and even fatal. These drugs must be avoided in people who have known aspirin sensitive asthma. Products with acetaminophen are considered safe for people who have asthma. It is important that people with aspirin sensitivity read labels of all over-the-counter drugs used to treat pain, colds, coughs, and fever.   Beta blockers and ACE inhibitors are other drugs which you should discuss with your caregiver, in relation to your asthma.  ALLERGY SKIN TESTING  Ask your asthma caregiver about allergy skin testing or blood testing (RAST test) to identify the allergens to which you are sensitive. If you are found to have allergies, allergy shots (immunotherapy) for asthma may help prevent future allergies and asthma. With allergy shots, small doses of allergens (substances to which you are allergic) are injected under your skin on a regular schedule. Over a period of time, your body may become used to the allergen and less responsive with asthma symptoms. You can also take measures to minimize your exposure to those allergens. EXERCISE  If you have exercise-induced asthma, or are planning vigorous exercise, or exercise in cold, humid, or dry environments, prevent exercise-induced asthma by following your caregiver's advice regarding asthma treatment before exercising. Document  Released: 10/02/2009 Document Revised: 06/26/2011 Document Reviewed: 10/02/2009 [facility] Patient Information 2012 [place], [place].

## 2011-12-21 NOTE — ED Provider Notes (Signed)
History     CSN: [id number]  Arrival date & time 12/21/11  1028   First MD Initiated Contact with Patient 12/21/11 1041      No chief complaint on file.   (Consider location/radiation/quality/duration/timing/severity/associated sxs/prior treatment) HPI History provided by pt.   Pt has had a gradually worsening cough x 1 month.  Cough has recently become productive of yellow sputum.  Aggravated by minimal exertion and associated w/ wheezing, dyspnea and pain across anterior and bilateral, lateral rib cage.  Dyspnea occurs only when she is coughing.  Has also had nasal congestion, rhinorrhea and sinus pressure.  Denies fever, abd pain, vomiting, diarrhea and LE pain/edema.  Minimal relief w/ her albuterol inhaler but felt better after receiving a nebulizer treatment this morning.  Per prior chart, pt seen for same sx on 12/04/11, had a neg CXR and was discharged home w/ prednisone and Robitussin AC which improved sx for a brief time.  Pt has h/o asthma as well as seasonal allergies.  No RF for PE.  Pt quit smoking 2 days ago.     Past Medical History  Diagnosis Date  . Asthma     Past Surgical History  Procedure Date  . Abdominal hysterectomy     No family history on file.  History  Substance Use Topics  . Smoking status: Former Smoker -- 1.5 packs/day  . Smokeless tobacco: Not on file  . Alcohol Use: Not on file     rare    OB History    Grav Para Term Preterm Abortions TAB SAB Ect Mult Living                  Review of Systems  All other systems reviewed and are negative.    Allergies  Review of patient's allergies indicates no known allergies.  Home Medications   Current Outpatient Rx  Name Route Sig Dispense Refill  . ACETAMINOPHEN 500 MG PO TABS Oral Take 500 mg by mouth every 6 (six) hours as needed. For pain     . ALBUTEROL SULFATE HFA 108 (90 BASE) MCG/ACT IN AERS Inhalation Inhale 2 puffs into the lungs every 6 (six) hours as needed. For wheezing     .  IBUPROFEN 600 MG PO TABS Oral Take 1,200 mg by mouth every 6 (six) hours as needed. For pain     . RANITIDINE HCL 75 MG PO TABS Oral Take 75 mg by mouth 2 (two) times daily as needed. For acid reflux       BP 117/74  Pulse 77  Temp 98.2 F (36.8 C)  SpO2 97%  Physical Exam  Nursing note and vitals reviewed. Constitutional: She is oriented to person, place, and time. She appears well-developed and well-nourished. No distress.  HENT:  Head: Normocephalic and atraumatic.  Eyes:       Normal appearance  Neck: Normal range of motion.  Cardiovascular: Normal rate, regular rhythm and intact distal pulses.   Pulmonary/Chest: Effort normal. No respiratory distress.       O2 sat 96%.  Coughing.  Expiratory wheezing; more pronounced on right.  Diffuse chest tenderness.   Abdominal: Soft. Bowel sounds are normal. She exhibits no distension. There is no tenderness. There is no guarding.  Musculoskeletal: Normal range of motion.       No peripheral edema or calf tenderness  Neurological: She is alert and oriented to person, place, and time.  Skin: Skin is warm and dry. No rash noted.  Psychiatric: She has  a normal mood and affect. Her behavior is normal.    ED Course  Procedures (including critical care time)  Labs Reviewed - No data to display Dg Chest 2 View  12/21/2011  *RADIOLOGY REPORT*  Clinical Data: Chest pain.  Shortness of breath.  Long-time smoker.  CHEST - 2 VIEW 12/21/2011:  Comparison: Two-view chest x-ray 12/04/2011, 12/23/2010 [facility] and 11/10/2028 [facility]  Findings: Cardiomediastinal silhouette unremarkable and unchanged. Mildly prominent bronchovascular markings diffusely and mild central peribronchial thickening, unchanged.  Lungs otherwise clear.  No pleural effusions.  Visualized bony thorax intact.  No significant interval change.  IMPRESSION: Stable mild changes of chronic bronchitis and/or asthma.  No acute cardiopulmonary disease.  Original  Report Authenticated By: Arnell Sieving, M.D.     1. Asthma       MDM  Current smoker w/ h/o asthma presents for second time w/ exertional, productive cough and dyspnea x 1 month.  Neg CXR on 12/04/11.  On exam, afebrile, VS w/in nml range, no respiratory distress, coughing, exp wheezing, diffuse chest tenderness, no LE tenderness/edema.  No RF for exam findings consistent w/ PE.   Pt receiving a breathing treatment + prednisone.  Repeat CXR pending.    CXR neg for pneumonia; shows stable asthma vs chronic bronchitis.  Results discussed w/ pt.  She reports feeling better after nebulizer treatment.  On re-examination, O2 sat stable, no coughing, no wheezing, pt is able to take deep inspirations.  Provided her with a replacement albuterol inhaler and d/c'd home w/ prednisone as well as allegra-D (she used to take this medication with relief of allergy sx).  Advised her to f/u with her new PCP at [facility] asap because she will likely need to be started on an inhaled steroid.  Return precautions discussed.     Medical screening examination/treatment/procedure(s) were performed by non-physician practitioner and as supervising physician I was immediately available for consultation/collaboration. Osvaldo Human, M.D.     Arie Sabina [place], PA 12/21/11 1214  Carleene Cooper III, MD 12/21/11 [phone]

## 2011-12-21 NOTE — ED Notes (Signed)
PA in room

## 2011-12-21 NOTE — ED Notes (Signed)
Resp. Therapist called for albuterol HFA

## 2011-12-21 NOTE — ED Notes (Signed)
RT called for Resp. Treatment

## 2011-12-21 NOTE — ED Notes (Signed)
Per ems, pt c/o coughing, sob x 1 month, used an albuterol treatment prior to arrival. Denied any fever, sat 96%

## 2012-04-02 ENCOUNTER — Encounter (HOSPITAL_COMMUNITY): Payer: Self-pay | Admitting: Emergency Medicine

## 2012-04-02 ENCOUNTER — Emergency Department (HOSPITAL_COMMUNITY)
Admission: EM | Admit: 2012-04-02 | Discharge: 2012-04-02 | Disposition: A | Discharge status: Home / Self Care | Payer: Medicaid Other | Attending: Emergency Medicine | Admitting: Emergency Medicine

## 2012-04-02 DIAGNOSIS — M546 Pain in thoracic spine: Secondary | ICD-10-CM | POA: Insufficient documentation

## 2012-04-02 DIAGNOSIS — T148XXA Other injury of unspecified body region, initial encounter: Secondary | ICD-10-CM

## 2012-04-02 DIAGNOSIS — J45909 Unspecified asthma, uncomplicated: Secondary | ICD-10-CM | POA: Insufficient documentation

## 2012-04-02 DIAGNOSIS — F17200 Nicotine dependence, unspecified, uncomplicated: Secondary | ICD-10-CM | POA: Insufficient documentation

## 2012-04-02 MED ORDER — CYCLOBENZAPRINE HCL 10 MG PO TABS
10.0000 mg | ORAL_TABLET | Freq: Once | ORAL | Status: AC
  Administered 2012-04-02: 10 mg via ORAL
  Filled 2012-04-02: qty 1

## 2012-04-02 MED ORDER — CYCLOBENZAPRINE HCL 10 MG PO TABS: 10.0000 mg | ORAL_TABLET | Freq: Two times a day (BID) | ORAL | Status: AC | PRN

## 2012-04-02 NOTE — ED Notes (Signed)
Pt c/o lower back pain that radiates to left shoulder. States that she thinks she may have pulled something while at work. Pain is 10 and it a "pulling sensation"

## 2012-04-02 NOTE — ED Provider Notes (Signed)
History     CSN: [id number]  Arrival date & time 04/02/12  1533   First MD Initiated Contact with Patient 04/02/12 1545      Chief Complaint  Patient presents with  . Back Pain  . Shoulder Pain    (Consider location/radiation/quality/duration/timing/severity/associated sxs/prior treatment) HPI  41 year old female presents complaining of back pain.  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 Yesterday while she was cleaning a bed she felt a pulled sensation to her L upper back.  Pain is acute on onset, throbbing sensation, and worsening with change in position.  Also expressed a "pulling sensation" with pain radiates to L upper shoulder.  Denies fever, rash, cp, sob or recent trauma.  Has tried tylenol/ibuprofen and soak in epson salt with minimal relief. Has tried working today but pain increases with movement.    Past Medical History  Diagnosis Date  . Asthma     Past Surgical History  Procedure Date  . Abdominal hysterectomy     No family history on file.  History  Substance Use Topics  . Smoking status: Current Everyday Smoker -- 1.5 packs/day  . Smokeless tobacco: Not on file  . Alcohol Use: Yes     rare    OB History    Grav Para Term Preterm Abortions TAB SAB Ect Mult Living                  Review of Systems  Constitutional: Negative for fever.  Cardiovascular: Negative for chest pain.  Musculoskeletal: Positive for back pain. Negative for arthralgias.  Skin: Negative for rash.  Neurological: Negative for headaches.    Allergies  Review of patient's allergies indicates no known allergies.  Home Medications   Current Outpatient Rx  Name Route Sig Dispense Refill  . ALBUTEROL SULFATE HFA 108 (90 BASE) MCG/ACT IN AERS Inhalation Inhale 2 puffs into the lungs every 6 (six) hours as needed. For wheezing     . NYQUIL COLD & FLU PO Oral Take 2 capsules by mouth every 6 (six) hours as needed. For cough.    Marland Kitchen FEXOFENADINE-PSEUDOEPHED ER 60-120 MG PO TB12  Oral Take 1 tablet by mouth every 12 (twelve) hours. 30 tablet 0  . IBUPROFEN 600 MG PO TABS Oral Take 1,800 mg by mouth every 6 (six) hours as needed. For pain    . PREDNISONE 10 MG PO TABS Oral Take 2 tablets (20 mg total) by mouth daily. 10 tablet 0  . RANITIDINE HCL 75 MG PO TABS Oral Take 75 mg by mouth 2 (two) times daily as needed. For acid reflux       BP 126/79  Pulse 80  Temp(Src) 98.2 F (36.8 C) (Oral)  Resp 15  SpO2 98%  Physical Exam  Nursing note and vitals reviewed. Constitutional: She appears well-developed and well-nourished.       Morbidly obese  HENT:  Head: Normocephalic and atraumatic.  Eyes: Conjunctivae are normal.  Neck: Normal range of motion. Neck supple.  Cardiovascular: Normal rate and regular rhythm.   Pulmonary/Chest: Effort normal. No respiratory distress. She has no wheezes. She exhibits no tenderness.  Abdominal: There is no CVA tenderness.  Musculoskeletal: She exhibits no edema.  Neurological: She is alert.  Skin: Skin is warm. No rash noted.       ED Course  Procedures (including critical care time)  Labs Reviewed - No data to display No results found.   No diagnosis found.    MDM  Pain to L upper  back.  TTP without overlying skin changes.  INcreasing pain with back flexion or extension.  No midline spine tenderness.    Will treat for muscle strain.  Doubt cardio/pulmonary etiology.  PERC negative.          Fayrene Helper, PA-C 04/02/12 1555

## 2012-04-02 NOTE — Discharge Instructions (Signed)
Muscle Strain A muscle strain (pulled muscle) happens when a muscle is over-stretched. Recovery usually takes 5 to 6 weeks.  HOME CARE   Put ice on the injured area.   Put ice in a plastic bag.   Place a towel between your skin and the bag.   Leave the ice on for 15 to 20 minutes at a time, every hour for the first 2 days.   Do not use the muscle for several days or until your doctor says you can. Do not use the muscle if you have pain.   Wrap the injured area with an elastic bandage for comfort. Do not put it on too tightly.   Only take medicine as told by your doctor.   Warm up before exercise. This helps prevent muscle strains.  GET HELP RIGHT AWAY IF:  There is increased pain or puffiness (swelling) in the affected area. MAKE SURE YOU:   Understand these instructions.   Will watch your condition.   Will get help right away if you are not doing well or get worse.  Document Released: 07/23/2008 Document Revised: 10/03/2011 Document Reviewed: 07/23/2008 Exit[employer] Patient Information 2012 [employer]. 

## 2012-04-03 NOTE — ED Provider Notes (Signed)
Medical screening examination/treatment/procedure(s) were performed by non-physician practitioner and as supervising physician I was immediately available for consultation/collaboration.  Guerry Covington, MD 04/03/12 1519 

## 2012-09-17 ENCOUNTER — Emergency Department (HOSPITAL_COMMUNITY)
Admission: EM | Admit: 2012-09-17 | Discharge: 2012-09-17 | Disposition: A | Discharge status: Home / Self Care | Payer: Self-pay | Attending: Emergency Medicine | Admitting: Emergency Medicine

## 2012-09-17 ENCOUNTER — Encounter (HOSPITAL_COMMUNITY): Payer: Self-pay | Admitting: Emergency Medicine

## 2012-09-17 DIAGNOSIS — F17200 Nicotine dependence, unspecified, uncomplicated: Secondary | ICD-10-CM | POA: Insufficient documentation

## 2012-09-17 DIAGNOSIS — L03319 Cellulitis of trunk, unspecified: Secondary | ICD-10-CM | POA: Insufficient documentation

## 2012-09-17 DIAGNOSIS — L0390 Cellulitis, unspecified: Secondary | ICD-10-CM

## 2012-09-17 DIAGNOSIS — J45909 Unspecified asthma, uncomplicated: Secondary | ICD-10-CM | POA: Insufficient documentation

## 2012-09-17 DIAGNOSIS — L02219 Cutaneous abscess of trunk, unspecified: Secondary | ICD-10-CM | POA: Insufficient documentation

## 2012-09-17 DIAGNOSIS — Z79899 Other long term (current) drug therapy: Secondary | ICD-10-CM | POA: Insufficient documentation

## 2012-09-17 HISTORY — DX: Other seasonal allergic rhinitis: J30.2

## 2012-09-17 MED ORDER — HYDROCODONE-ACETAMINOPHEN 5-325 MG PO TABS: 1.0000 | ORAL_TABLET | ORAL | Status: DC | PRN

## 2012-09-17 MED ORDER — CEPHALEXIN 250 MG PO CAPS
500.0000 mg | ORAL_CAPSULE | Freq: Once | ORAL | Status: AC
  Administered 2012-09-17: 500 mg via ORAL
  Filled 2012-09-17: qty 2

## 2012-09-17 MED ORDER — CEPHALEXIN 500 MG PO CAPS: 500.0000 mg | ORAL_CAPSULE | Freq: Four times a day (QID) | ORAL | Status: DC

## 2012-09-17 NOTE — ED Provider Notes (Signed)
History     CSN: [id number]  Arrival date & time 09/17/12  2051   First MD Initiated Contact with Patient 09/17/12 2133      Chief Complaint  Patient presents with  . Abscess    (Consider location/radiation/quality/duration/timing/severity/associated sxs/prior treatment) HPI History provided by pt.   Pt presents w/ severely painful abscess to left lower abdominal wall x 7 days.  No drainage.  No associated fever.   Past Medical History  Diagnosis Date  . Asthma   . Seasonal allergies     Past Surgical History  Procedure Date  . Abdominal hysterectomy     History reviewed. No pertinent family history.  History  Substance Use Topics  . Smoking status: Current Every Day Smoker -- 0.3 packs/day    Types: Cigarettes  . Smokeless tobacco: Not on file  . Alcohol Use: Yes     Comment: occasion    OB History    Grav Para Term Preterm Abortions TAB SAB Ect Mult Living                  Review of Systems  All other systems reviewed and are negative.    Allergies  Review of patient's allergies indicates no known allergies.  Home Medications   Current Outpatient Rx  Name  Route  Sig  Dispense  Refill  . ALBUTEROL SULFATE HFA 108 (90 BASE) MCG/ACT IN AERS   Inhalation   Inhale 2 puffs into the lungs every 6 (six) hours as needed. For wheezing          . BUDESONIDE-FORMOTEROL FUMARATE 160-4.5 MCG/ACT IN AERO   Inhalation   Inhale 2 puffs into the lungs 2 (two) times daily.           BP 101/76  Pulse 96  Temp 98.3 F (36.8 C) (Oral)  Resp 18  SpO2 98%  Physical Exam  Nursing note and vitals reviewed. Constitutional: She is oriented to person, place, and time. She appears well-developed and well-nourished. No distress.  HENT:  Head: Normocephalic and atraumatic.  Eyes:       Normal appearance  Neck: Normal range of motion.  Pulmonary/Chest: Effort normal.  Abdominal: Soft.       Obese.  2.5cm fluctuant abscess w/ ~10cm surrounding erythema at  LLQ.  No induration.  No drainage.  Localized ttp.    Musculoskeletal: Normal range of motion.  Neurological: She is alert and oriented to person, place, and time.  Psychiatric: She has a normal mood and affect. Her behavior is normal.    ED Course  Procedures (including critical care time)  INCISION AND DRAINAGE Performed by: Otilio Miu Consent: Verbal consent obtained. Risks and benefits: risks, benefits and alternatives were discussed Type: abscess  Body area: LLQ  Anesthesia: local infiltration  Incision was made with a scalpel.  Local anesthetic: lidocaine 2% w/ epinephrine  Anesthetic total: 5 ml  Complexity: complex Blunt dissection to break up loculations  Drainage: blood   Packing material:  none  Patient tolerance: Patient tolerated the procedure well with no immediate complications.    Labs Reviewed - No data to display No results found.   1. Cellulitis       MDM  41yo F presents w/ what appeared to be an abscess of abdominal wall w/ surrounding cellulitis.  I&D'd w/out drainage of pus.  Margins of cellulitis drawn.  Pt d/c'd home w/ vicodin and keflex.  Return precautions discussed.         Arie Sabina  [place], [place] 09/18/12 [phone]

## 2012-09-17 NOTE — ED Notes (Signed)
Pt reports having sharp pain on abd for a week, reports noticed lump to abd where pain is; pt has red swollen abscess-tender to touch

## 2012-09-18 NOTE — ED Provider Notes (Signed)
Medical screening examination/treatment/procedure(s) were performed by non-physician practitioner and as supervising physician I was immediately available for consultation/collaboration.   Lyanne Co, MD 09/18/12 [phone]

## 2012-09-22 ENCOUNTER — Emergency Department (HOSPITAL_COMMUNITY)
Admission: EM | Admit: 2012-09-22 | Discharge: 2012-09-22 | Disposition: A | Discharge status: Home / Self Care | Payer: Self-pay | Attending: Emergency Medicine | Admitting: Emergency Medicine

## 2012-09-22 ENCOUNTER — Encounter (HOSPITAL_COMMUNITY): Payer: Self-pay | Admitting: Emergency Medicine

## 2012-09-22 DIAGNOSIS — J45909 Unspecified asthma, uncomplicated: Secondary | ICD-10-CM | POA: Insufficient documentation

## 2012-09-22 DIAGNOSIS — F17200 Nicotine dependence, unspecified, uncomplicated: Secondary | ICD-10-CM | POA: Insufficient documentation

## 2012-09-22 DIAGNOSIS — Z79899 Other long term (current) drug therapy: Secondary | ICD-10-CM | POA: Insufficient documentation

## 2012-09-22 DIAGNOSIS — L089 Local infection of the skin and subcutaneous tissue, unspecified: Secondary | ICD-10-CM | POA: Insufficient documentation

## 2012-09-22 NOTE — ED Notes (Signed)
Pt here for increased pain and redness from cellulitis on left lower abd that is not improving; pt returned for recheck

## 2012-09-22 NOTE — ED Provider Notes (Signed)
Medical screening examination/treatment/procedure(s) were performed by non-physician practitioner and as supervising physician I was immediately available for consultation/collaboration.   Richardean Canal, MD 09/22/12 [phone]

## 2012-09-22 NOTE — ED Provider Notes (Signed)
History     CSN: [id number]  Arrival date & time 09/22/12  1514   First MD Initiated Contact with Patient 09/22/12 1619      Chief Complaint  Patient presents with  . Wound Check    (Consider location/radiation/quality/duration/timing/severity/associated sxs/prior treatment) Patient is a 41 y.o. female presenting with wound check. The history is provided by the patient. No language interpreter was used.  Wound Check  Treated in ED: 4 days ago. Previous treatment in the ED includes I&D of abscess. Treatments since wound repair include oral antibiotics. Her temperature was unmeasured prior to arrival. There has been bloody discharge from the wound. The redness has improved. The swelling has improved. The pain has not changed.    Past Medical History  Diagnosis Date  . Asthma   . Seasonal allergies     Past Surgical History  Procedure Date  . Abdominal hysterectomy     History reviewed. No pertinent family history.  History  Substance Use Topics  . Smoking status: Current Every Day Smoker -- 0.3 packs/day    Types: Cigarettes  . Smokeless tobacco: Not on file  . Alcohol Use: Yes     Comment: occasion    OB History    Grav Para Term Preterm Abortions TAB SAB Ect Mult Living                  Review of Systems  Skin: Positive for wound.  All other systems reviewed and are negative.    Allergies  Review of patient's allergies indicates no known allergies.  Home Medications   Current Outpatient Rx  Name  Route  Sig  Dispense  Refill  . ALBUTEROL SULFATE HFA 108 (90 BASE) MCG/ACT IN AERS   Inhalation   Inhale 2 puffs into the lungs every 6 (six) hours as needed. For wheezing          . BUDESONIDE-FORMOTEROL FUMARATE 160-4.5 MCG/ACT IN AERO   Inhalation   Inhale 2 puffs into the lungs 2 (two) times daily.         . CEPHALEXIN 500 MG PO CAPS   Oral   Take 1 capsule (500 mg total) by mouth 4 (four) times daily.   20 capsule   0   .  HYDROCODONE-ACETAMINOPHEN 5-325 MG PO TABS   Oral   Take 1 tablet by mouth every 4 (four) hours as needed for pain.   20 tablet   0     BP 125/68  Pulse 79  Temp 97.9 F (36.6 C) (Oral)  Resp 18  SpO2 100%  Physical Exam  Nursing note and vitals reviewed. Constitutional: She is oriented to person, place, and time. She appears well-developed and well-nourished.  HENT:  Head: Normocephalic and atraumatic.  Musculoskeletal: Normal range of motion.  Neurological: She is alert and oriented to person, place, and time. She has normal reflexes.  Skin: There is erythema.       Incision lower abdomen. No fluctuance, no drainage,  Wound looks good,  No cellulitis  Psychiatric: She has a normal mood and affect.    ED Course  Procedures (including critical care time)  Labs Reviewed - No data to display No results found.   No diagnosis found.    MDM  Continue antibiotics,   Continue wound care       Lonia Skinner [place], [place] 09/22/12 1643

## 2012-10-31 ENCOUNTER — Emergency Department (HOSPITAL_COMMUNITY)
Admission: EM | Admit: 2012-10-31 | Discharge: 2012-10-31 | Disposition: A | Discharge status: Home / Self Care | Payer: Self-pay | Attending: Emergency Medicine | Admitting: Emergency Medicine

## 2012-10-31 ENCOUNTER — Emergency Department (HOSPITAL_COMMUNITY): Payer: Self-pay

## 2012-10-31 ENCOUNTER — Encounter (HOSPITAL_COMMUNITY): Payer: Self-pay | Admitting: *Deleted

## 2012-10-31 DIAGNOSIS — Z79899 Other long term (current) drug therapy: Secondary | ICD-10-CM | POA: Insufficient documentation

## 2012-10-31 DIAGNOSIS — IMO0002 Reserved for concepts with insufficient information to code with codable children: Secondary | ICD-10-CM | POA: Insufficient documentation

## 2012-10-31 DIAGNOSIS — J45909 Unspecified asthma, uncomplicated: Secondary | ICD-10-CM | POA: Insufficient documentation

## 2012-10-31 DIAGNOSIS — Y939 Activity, unspecified: Secondary | ICD-10-CM | POA: Insufficient documentation

## 2012-10-31 DIAGNOSIS — Y929 Unspecified place or not applicable: Secondary | ICD-10-CM | POA: Insufficient documentation

## 2012-10-31 DIAGNOSIS — S63509A Unspecified sprain of unspecified wrist, initial encounter: Secondary | ICD-10-CM | POA: Insufficient documentation

## 2012-10-31 DIAGNOSIS — F17200 Nicotine dependence, unspecified, uncomplicated: Secondary | ICD-10-CM | POA: Insufficient documentation

## 2012-10-31 MED ORDER — OXYCODONE-ACETAMINOPHEN 5-325 MG PO TABS: 1.0000 | ORAL_TABLET | ORAL | Status: DC | PRN

## 2012-10-31 MED ORDER — NAPROXEN 500 MG PO TABS: 500.0000 mg | ORAL_TABLET | Freq: Two times a day (BID) | ORAL | Status: DC | PRN

## 2012-10-31 MED ORDER — HYDROMORPHONE HCL PF 1 MG/ML IJ SOLN
1.0000 mg | Freq: Once | INTRAMUSCULAR | Status: AC
  Administered 2012-10-31: 1 mg via INTRAMUSCULAR
  Filled 2012-10-31: qty 1

## 2012-10-31 MED ORDER — IBUPROFEN 400 MG PO TABS
600.0000 mg | ORAL_TABLET | Freq: Once | ORAL | Status: AC
  Administered 2012-10-31: 600 mg via ORAL
  Filled 2012-10-31: qty 1
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
## 2012-10-31 NOTE — ED Notes (Signed)
Patient transported to X-ray 

## 2012-10-31 NOTE — Progress Notes (Signed)
Orthopedic Tech Progress Note Patient Details:  Yolanda White February 22, 1971 [id number] Velcro wrist splint applied to Right wrist with care instructions. Patient tolerated application well.  Ortho Devices Type of Ortho Device: Velcro wrist splint Ortho Device/Splint Location: Right wrist Ortho Device/Splint Interventions: Application   Asia R Thompson 10/31/2012, 3:36 PM

## 2012-10-31 NOTE — ED Notes (Signed)
Ortho at bed side to place RT wrist splint
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
## 2013-01-05 ENCOUNTER — Encounter (HOSPITAL_COMMUNITY): Payer: Self-pay | Admitting: *Deleted

## 2013-01-05 ENCOUNTER — Emergency Department (HOSPITAL_COMMUNITY): Payer: Self-pay

## 2013-01-05 ENCOUNTER — Emergency Department (HOSPITAL_COMMUNITY)
Admission: EM | Admit: 2013-01-05 | Discharge: 2013-01-06 | Disposition: A | Discharge status: Home / Self Care | Payer: Self-pay | Attending: Emergency Medicine | Admitting: Emergency Medicine

## 2013-01-05 DIAGNOSIS — F17200 Nicotine dependence, unspecified, uncomplicated: Secondary | ICD-10-CM | POA: Insufficient documentation

## 2013-01-05 DIAGNOSIS — Z79899 Other long term (current) drug therapy: Secondary | ICD-10-CM | POA: Insufficient documentation

## 2013-01-05 DIAGNOSIS — M25539 Pain in unspecified wrist: Secondary | ICD-10-CM | POA: Insufficient documentation

## 2013-01-05 DIAGNOSIS — J45909 Unspecified asthma, uncomplicated: Secondary | ICD-10-CM | POA: Insufficient documentation
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
## 2013-01-06 MED ORDER — HYDROCODONE-ACETAMINOPHEN 5-325 MG PO TABS: 1.0000 | ORAL_TABLET | ORAL | Status: DC | PRN

## 2013-01-06 MED ORDER — OXYCODONE-ACETAMINOPHEN 5-325 MG PO TABS
2.0000 | ORAL_TABLET | Freq: Once | ORAL | Status: AC
  Administered 2013-01-06: 2 via ORAL
  Filled 2013-01-06: qty 2
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
## 2013-01-13 NOTE — ED Provider Notes (Signed)
Medical screening examination/treatment/procedure(s) were performed by non-physician practitioner and as supervising physician I was immediately available for consultation/collaboration.  Sunnie Nielsen, MD 01/13/13 [phone]

## 2014-09-05 ENCOUNTER — Encounter (HOSPITAL_COMMUNITY): Payer: Self-pay | Admitting: *Deleted

## 2014-09-05 ENCOUNTER — Emergency Department (HOSPITAL_COMMUNITY)
Admission: EM | Admit: 2014-09-05 | Discharge: 2014-09-05 | Disposition: A | Discharge status: Home / Self Care | Payer: Medicaid Other | Attending: Emergency Medicine | Admitting: Emergency Medicine

## 2014-09-05 DIAGNOSIS — J45909 Unspecified asthma, uncomplicated: Secondary | ICD-10-CM | POA: Insufficient documentation

## 2014-09-05 DIAGNOSIS — Z90710 Acquired absence of both cervix and uterus: Secondary | ICD-10-CM | POA: Insufficient documentation

## 2014-09-05 DIAGNOSIS — R109 Unspecified abdominal pain: Secondary | ICD-10-CM | POA: Insufficient documentation

## 2014-09-05 DIAGNOSIS — M545 Low back pain, unspecified: Secondary | ICD-10-CM

## 2014-09-05 DIAGNOSIS — Z720 Tobacco use: Secondary | ICD-10-CM | POA: Insufficient documentation

## 2014-09-05 DIAGNOSIS — M62830 Muscle spasm of back: Secondary | ICD-10-CM | POA: Insufficient documentation

## 2014-09-05 DIAGNOSIS — Z79899 Other long term (current) drug therapy: Secondary | ICD-10-CM | POA: Insufficient documentation

## 2014-09-05 DIAGNOSIS — R112 Nausea with vomiting, unspecified: Secondary | ICD-10-CM | POA: Insufficient documentation

## 2014-09-05 LAB — CBC
HCT: 37.5 % (ref 36.0–46.0)
HEMOGLOBIN: 12.8 g/dL (ref 12.0–15.0)
MCH: 28.3 pg (ref 26.0–34.0)
MCHC: 34.1 g/dL (ref 30.0–36.0)
MCV: 82.8 fL (ref 78.0–100.0)
Platelets: 211 10*3/uL (ref 150–400)
RBC: 4.53 MIL/uL (ref 3.87–5.11)
RDW: 12.7 % (ref 11.5–15.5)
WBC: 7.2 10*3/uL (ref 4.0–10.5)

## 2014-09-05 LAB — COMPREHENSIVE METABOLIC PANEL
ALT: 11 U/L (ref 0–35)
AST: 13 U/L (ref 0–37)
Albumin: 3.4 g/dL — ABNORMAL LOW (ref 3.5–5.2)
Alkaline Phosphatase: 88 U/L (ref 39–117)
Anion gap: 13 (ref 5–15)
BILIRUBIN TOTAL: 0.5 mg/dL (ref 0.3–1.2)
BUN: 12 mg/dL (ref 6–23)
CALCIUM: 8.7 mg/dL (ref 8.4–10.5)
CHLORIDE: 105 meq/L (ref 96–112)
CO2: 21 mEq/L (ref 19–32)
Creatinine, Ser: 0.99 mg/dL (ref 0.50–1.10)
GFR calc non Af Amer: 69 mL/min — ABNORMAL LOW (ref >90)
GFR, EST AFRICAN AMERICAN: 80 mL/min — AB (ref >90)
GLUCOSE: 105 mg/dL — AB (ref 70–99)
Potassium: 4.2 mEq/L (ref 3.7–5.3)
Sodium: 139 mEq/L (ref 137–147)
Total Protein: 6.9 g/dL (ref 6.0–8.3)

## 2014-09-05 LAB — URINALYSIS, ROUTINE W REFLEX MICROSCOPIC
BILIRUBIN URINE: NEGATIVE
Glucose, UA: NEGATIVE mg/dL
Ketones, ur: NEGATIVE mg/dL
Leukocytes, UA: NEGATIVE
Nitrite: NEGATIVE
PH: 6 (ref 5.0–8.0)
Protein, ur: NEGATIVE mg/dL
SPECIFIC GRAVITY, URINE: 1.012 (ref 1.005–1.030)
Urobilinogen, UA: 1 mg/dL (ref 0.0–1.0)

## 2014-09-05 LAB — URINE MICROSCOPIC-ADD ON

## 2014-09-05 LAB — I-STAT TROPONIN, ED: Troponin i, poc: 0.01 ng/mL (ref 0.00–0.08)

## 2014-09-05 MED ORDER — CYCLOBENZAPRINE HCL 10 MG PO TABS: 10.0000 mg | ORAL_TABLET | Freq: Two times a day (BID) | ORAL | Status: DC | PRN

## 2014-09-05 MED ORDER — MORPHINE SULFATE 4 MG/ML IJ SOLN
4.0000 mg | Freq: Once | INTRAMUSCULAR | Status: AC
  Administered 2014-09-05: 4 mg via INTRAVENOUS
  Filled 2014-09-05: qty 1

## 2014-09-05 MED ORDER — IBUPROFEN 800 MG PO TABS: 800.0000 mg | ORAL_TABLET | Freq: Three times a day (TID) | ORAL | Status: DC

## 2014-09-05 MED ORDER — ONDANSETRON HCL 4 MG/2ML IJ SOLN
4.0000 mg | Freq: Once | INTRAMUSCULAR | Status: AC
  Administered 2014-09-05: 4 mg via INTRAVENOUS
  Filled 2014-09-05: qty 2

## 2014-09-05 MED ORDER — HYDROCODONE-ACETAMINOPHEN 5-325 MG PO TABS: 1.0000 | ORAL_TABLET | ORAL | Status: DC | PRN

## 2014-09-05 NOTE — Discharge Instructions (Signed)
Take Vicodin for severe pain only. No driving or operating heavy machinery while taking vicodin. This medication may cause drowsiness. No driving or operating heavy machinery while taking flexeril. This medication may make you drowsy. Take ibuprofen as directed. Apply heat to your back.  Lumbosacral Strain Lumbosacral strain is a strain of any of the parts that make up your lumbosacral vertebrae. Your lumbosacral vertebrae are the bones that make up the lower third of your backbone. Your lumbosacral vertebrae are held together by muscles and tough, fibrous tissue (ligaments).  CAUSES  A sudden blow to your back can cause lumbosacral strain. Also, anything that causes an excessive stretch of the muscles in the low back can cause this strain. This is typically seen when people exert themselves strenuously, fall, lift heavy objects, bend, or crouch repeatedly. RISK FACTORS  Physically demanding work.  Participation in pushing or pulling sports or sports that require a sudden twist of the back (tennis, golf, baseball).  Weight lifting.  Excessive lower back curvature.  Forward-tilted pelvis.  Weak back or abdominal muscles or both.  Tight hamstrings. SIGNS AND SYMPTOMS  Lumbosacral strain may cause pain in the area of your injury or pain that moves (radiates) down your leg.  DIAGNOSIS Your health care provider can often diagnose lumbosacral strain through a physical exam. In some cases, you may need tests such as X-ray exams.  TREATMENT  Treatment for your lower back injury depends on many factors that your clinician will have to evaluate. However, most treatment will include the use of anti-inflammatory medicines. HOME CARE INSTRUCTIONS   Avoid hard physical activities (tennis, racquetball, waterskiing) if you are not in proper physical condition for it. This may aggravate or create problems.  If you have a back problem, avoid sports requiring sudden body movements. Swimming and walking  are generally safer activities.  Maintain good posture.  Maintain a healthy weight.  For acute conditions, you may put ice on the injured area.  Put ice in a plastic bag.  Place a towel between your skin and the bag.  Leave the ice on for 20 minutes, 2-3 times a day.  When the low back starts healing, stretching and strengthening exercises may be recommended. SEEK MEDICAL CARE IF:  Your back pain is getting worse.  You experience severe back pain not relieved with medicines. SEEK IMMEDIATE MEDICAL CARE IF:   You have numbness, tingling, weakness, or problems with the use of your arms or legs.  There is a change in bowel or bladder control.  You have increasing pain in any area of the body, including your belly (abdomen).  You notice shortness of breath, dizziness, or feel faint.  You feel sick to your stomach (nauseous), are throwing up (vomiting), or become sweaty.  You notice discoloration of your toes or legs, or your feet get very cold. MAKE SURE YOU:   Understand these instructions.  Will watch your condition.  Will get help right away if you are not doing well or get worse. Document Released: 07/24/2005 Document Revised: 10/19/2013 Document Reviewed: 06/02/2013 [facility] Patient Information 2015 [place], [place]. This information is not intended to replace advice given to you by your health care provider. Make sure you discuss any questions you have with your health care provider.  Muscle Cramps and Spasms Muscle cramps and spasms occur when a muscle or muscles tighten and you have no control over this tightening (involuntary muscle contraction). They are a common problem and can develop in any muscle. The most common  place is in the calf muscles of the leg. Both muscle cramps and muscle spasms are involuntary muscle contractions, but they also have differences:   Muscle cramps are sporadic and painful. They may last a few seconds to a quarter of an hour. Muscle  cramps are often more forceful and last longer than muscle spasms.  Muscle spasms may or may not be painful. They may also last just a few seconds or much longer. CAUSES  It is uncommon for cramps or spasms to be due to a serious underlying problem. In many cases, the cause of cramps or spasms is unknown. Some common causes are:   Overexertion.   Overuse from repetitive motions (doing the same thing over and over).   Remaining in a certain position for a long period of time.   Improper preparation, form, or technique while performing a sport or activity.   Dehydration.   Injury.   Side effects of some medicines.   Abnormally low levels of the salts and ions in your blood (electrolytes), especially potassium and calcium. This could happen if you are taking water pills (diuretics) or you are pregnant.  Some underlying medical problems can make it more likely to develop cramps or spasms. These include, but are not limited to:   Diabetes.   Parkinson disease.   Hormone disorders, such as thyroid problems.   Alcohol abuse.   Diseases specific to muscles, joints, and bones.   Blood vessel disease where not enough blood is getting to the muscles.  HOME CARE INSTRUCTIONS   Stay well hydrated. Drink enough water and fluids to keep your urine clear or pale yellow.  It may be helpful to massage, stretch, and relax the affected muscle.  For tight or tense muscles, use a warm towel, heating pad, or hot shower water directed to the affected area.  If you are sore or have pain after a cramp or spasm, applying ice to the affected area may relieve discomfort.  Put ice in a plastic bag.  Place a towel between your skin and the bag.  Leave the ice on for 15-20 minutes, 03-04 times a day.  Medicines used to treat a known cause of cramps or spasms may help reduce their frequency or severity. Only take over-the-counter or prescription medicines as directed by your  caregiver. SEEK MEDICAL CARE IF:  Your cramps or spasms get more severe, more frequent, or do not improve over time.  MAKE SURE YOU:   Understand these instructions.  Will watch your condition.  Will get help right away if you are not doing well or get worse. Document Released: 04/05/2002 Document Revised: 02/08/2013 Document Reviewed: 09/30/2012 [facility] Patient Information 2015 [place], [place]. This information is not intended to replace advice given to you by your health care provider. Make sure you discuss any questions you have with your health care provider.  Spasticity Spasticity is a condition in which certain muscles contract continuously. This causes stiffness or tightness of the muscles. It may interfere with movement, speech, and manner of walking. CAUSES  This condition is usually caused by damage to the portion of the brain or spinal cord that controls voluntary movement. It may occur in association with:  Spinal cord injury.  Multiple sclerosis.  Cerebral palsy.  Brain damage due to lack of oxygen.  Brain trauma.  Severe head injury.  Metabolic diseases such as:  Adrenoleukodystrophy.  ALS [place] Cerise Gehrig's disease).  Phenylketonuria. SYMPTOMS   Increased muscle tone (hypertonicity).  A series of rapid  muscle contractions (clonus).  Exaggerated deep tendon reflexes.  Muscle spasms.  Involuntary crossing of the legs (scissoring).  Fixed joints. The degree of spasticity varies. It ranges from mild muscle stiffness to severe, painful, and uncontrollable muscle spasms. It can interfere with rehabilitation in patients with certain disorders. It often interferes with daily activities. TREATMENT  Treatment may include:  Medications.  Physical therapy regimens. They may include muscle stretching and range of motion exercises. These help prevent shrinkage or shortening of muscles. They also help reduce the severity of symptoms.  Surgery. This may be  recommended for tendon release or to sever the nerve-muscle pathway. PROGNOSIS  The outcome for those with spasticity depends on:  Severity of the spasticity.  Associated disorder(s). Document Released: 10/04/2002 Document Revised: 01/06/2012 Document Reviewed: 12/28/2013 [facility] Patient Information 2015 [place], [place]. This information is not intended to replace advice given to you by your health care provider. Make sure you discuss any questions you have with your health care provider.

## 2014-09-05 NOTE — ED Notes (Addendum)
Pt states right flank pain and pain with urination.  Pt reports nausea and vomiting.  LMP--hysterectomy, no vaginal discharge.  Pt states that she also had some left sided neck pain and left upper chest.

## 2014-09-05 NOTE — ED Provider Notes (Signed)
CSN: [id number]     Arrival date & time 09/05/14  1443 History   First MD Initiated Contact with Patient 09/05/14 [phone]     Chief Complaint  Patient presents with  . Flank Pain  . Dysuria  . Neck Pain     (Consider location/radiation/quality/duration/timing/severity/associated sxs/prior Treatment) HPI Comments: This is a 43 year old female with a past medical history of asthma who presents to the emergency department complaining of sudden onset nausea and vomiting beginning at 3:00 AM today. Patient reports she woke up from sleep feeling nauseated and had a few episodes of nonbloody, nonbilious emesis followed by right-sided flank pain, right lower back pain and discomfort in the left side of her neck. She states she felt a lump on the left side of her neck, however shortly after vomiting it subsided. Denies chest pain or shortness of breath. Right-sided flank pain is nonradiating, constant, severe sharp pains at random. Admits to associated dysuria with the urge to urinate. Yesterday she was feeling fine. States she does a lot of heavy lifting at her job and is not sure if she strained a back muscle. Denies pain, numbness or tingling radiating down her extremities. No loss of control of bowels or bladder or saddle anesthesia. Denies fevers or abdominal pain. History of partial hysterectomy.  Patient is a 43 y.o. female presenting with flank pain, dysuria, and neck pain. The history is provided by the patient.  Flank Pain Associated symptoms include neck pain.  Dysuria Associated symptoms: flank pain   Neck Pain   Past Medical History  Diagnosis Date  . Asthma   . Seasonal allergies    Past Surgical History  Procedure Laterality Date  . Abdominal hysterectomy     No family history on file. History  Substance Use Topics  . Smoking status: Current Every Day Smoker -- 0.30 packs/day    Types: Cigarettes  . Smokeless tobacco: Not on file  . Alcohol Use: Yes     Comment: occasion    OB History    No data available     Review of Systems  10 Systems reviewed and are negative for acute change except as noted in the HPI.  Allergies  Review of patient's allergies indicates no known allergies.  Home Medications   Prior to Admission medications   Medication Sig Start Date End Date Taking? Authorizing Provider  acetaminophen (TYLENOL) 500 MG tablet Take 1,000 mg by mouth 2 (two) times daily as needed for moderate pain.   Yes Historical Provider, MD  cyclobenzaprine (FLEXERIL) 10 MG tablet Take 1 tablet (10 mg total) by mouth 2 (two) times daily as needed for muscle spasms. 09/05/14   Carman Ching, PA-C  HYDROcodone-acetaminophen (NORCO/VICODIN) 5-325 MG per tablet Take 1-2 tablets by mouth every 4 (four) hours as needed. 09/05/14   Herberto Ledwell M Arica Bevilacqua, PA-C  ibuprofen (ADVIL,MOTRIN) 800 MG tablet Take 1 tablet (800 mg total) by mouth 3 (three) times daily. 09/05/14   Charie Pinkus M Aashish Hamm, PA-C   BP 100/65 mmHg  Pulse 56  Temp(Src) 98.8 F (37.1 C) (Oral)  Resp 16  Ht 5\' 2"  (1.575 m)  SpO2 99% Physical Exam  Constitutional: She is oriented to person, place, and time. She appears well-developed and well-nourished. No distress.  Uncomfortable but in NAD.  HENT:  Head: Normocephalic and atraumatic.  Mouth/Throat: Oropharynx is clear and moist.  Mild tenderness over left SCM with spasm.  Eyes: Conjunctivae and EOM are normal. Pupils are equal, round, and reactive to light.  Neck: Normal range of motion. Neck supple. No JVD present.  Cardiovascular: Normal rate, regular rhythm, normal heart sounds and intact distal pulses.   No extremity edema.  Pulmonary/Chest: Effort normal and breath sounds normal. No respiratory distress.  Abdominal: Soft. Bowel sounds are normal.  Suprapubic tenderness. No rigidity, guarding or rebound. No peritoneal signs. Right sided CVA tenderness.  Musculoskeletal: Normal range of motion. She exhibits no edema.       Back:  R sided paraspinal muscle  tenderness.  Neurological: She is alert and oriented to person, place, and time. She has normal strength. No sensory deficit.  Speech fluent, goal oriented. Moves limbs without ataxia. Equal grip strength bilateral.  Skin: Skin is warm and dry. She is not diaphoretic.  Psychiatric: She has a normal mood and affect. Her behavior is normal.  Nursing note and vitals reviewed.   ED Course  Procedures (including critical care time) Labs Review Labs Reviewed  URINALYSIS, ROUTINE W REFLEX MICROSCOPIC - Abnormal; Notable for the following:    APPearance CLOUDY (*)    Hgb urine dipstick TRACE (*)    All other components within normal limits  COMPREHENSIVE METABOLIC PANEL - Abnormal; Notable for the following:    Glucose, Bld 105 (*)    Albumin 3.4 (*)    GFR calc non Af Amer 69 (*)    GFR calc Af Amer 80 (*)    All other components within normal limits  URINE MICROSCOPIC-ADD ON - Abnormal; Notable for the following:    Squamous Epithelial / LPF FEW (*)    All other components within normal limits  CBC  I-STAT TROPOININ, ED    Imaging Review No results found.   EKG Interpretation   Date/Time:  Monday September 05 2014 08:44:19 EST Ventricular Rate:  62 PR Interval:  153 QRS Duration: 88 QT Interval:  422 QTC Calculation: 428 R Axis:   59 Text Interpretation:  Sinus rhythm Low voltage, precordial leads  Non-specific ST-t changes since last tracing no significant change  Confirmed by KOHUT  MD, STEPHEN ([id number]) on 09/05/2014 8:52:43 AM      MDM   Final diagnoses:  Right-sided low back pain without sciatica  Lumbar paraspinal muscle spasm   Patient presenting with right-sided flank pain, nausea and vomiting. She also had an episode of left-sided neck pain. She is nontoxic appearing, uncomfortable but in no apparent distress. Afebrile, vital signs stable. Suprapubic abdominal tenderness without peritoneal signs. Right-sided CVA tenderness along with right paraspinal muscle  tenderness. Left-sided SCM tenderness with spasm. Doubt neck pain is cardiac, however will obtain troponin. EKG without any significant findings. Labs, urinalysis pending. 10:14 AM Labs without any acute finding. Urinalysis negative for infection. Patient reports after receiving morphine and Zofran she is feeling much better. Abdomen is soft and nontender. She is still with right-sided paraspinal muscle tenderness. No further CVA tenderness. Given symptoms of suprapubic pain, will send for urine culture. Patient will be discharged home with a muscle relaxer, pain medication and ibuprofen. Neck pain musculoskeletal, doubt cardiac. HEART score 1. Stable for d/c. F/u with PCP. Return precautions given. Patient states understanding of treatment care plan and is agreeable.  Carman Ching, PA-C 09/05/14 [address] PA-C 09/05/14 [place], MD 09/05/14 1139

## 2014-09-07 ENCOUNTER — Telehealth (HOSPITAL_BASED_OUTPATIENT_CLINIC_OR_DEPARTMENT_OTHER): Payer: Self-pay | Admitting: Emergency Medicine

## 2014-10-06 ENCOUNTER — Encounter (HOSPITAL_COMMUNITY): Payer: Self-pay

## 2014-10-06 ENCOUNTER — Emergency Department (HOSPITAL_COMMUNITY)
Admission: EM | Admit: 2014-10-06 | Discharge: 2014-10-06 | Disposition: A | Discharge status: Home / Self Care | Payer: Self-pay | Attending: Emergency Medicine | Admitting: Emergency Medicine

## 2014-10-06 ENCOUNTER — Emergency Department (HOSPITAL_COMMUNITY): Payer: Medicaid Other

## 2014-10-06 DIAGNOSIS — Z791 Long term (current) use of non-steroidal anti-inflammatories (NSAID): Secondary | ICD-10-CM | POA: Insufficient documentation

## 2014-10-06 DIAGNOSIS — L03311 Cellulitis of abdominal wall: Secondary | ICD-10-CM | POA: Insufficient documentation

## 2014-10-06 DIAGNOSIS — R112 Nausea with vomiting, unspecified: Secondary | ICD-10-CM | POA: Insufficient documentation

## 2014-10-06 DIAGNOSIS — R05 Cough: Secondary | ICD-10-CM | POA: Insufficient documentation

## 2014-10-06 DIAGNOSIS — R197 Diarrhea, unspecified: Secondary | ICD-10-CM | POA: Insufficient documentation

## 2014-10-06 DIAGNOSIS — Z3202 Encounter for pregnancy test, result negative: Secondary | ICD-10-CM | POA: Insufficient documentation

## 2014-10-06 DIAGNOSIS — Z90710 Acquired absence of both cervix and uterus: Secondary | ICD-10-CM | POA: Insufficient documentation

## 2014-10-06 DIAGNOSIS — J45901 Unspecified asthma with (acute) exacerbation: Secondary | ICD-10-CM | POA: Insufficient documentation

## 2014-10-06 DIAGNOSIS — Z720 Tobacco use: Secondary | ICD-10-CM | POA: Insufficient documentation

## 2014-10-06 DIAGNOSIS — R059 Cough, unspecified: Secondary | ICD-10-CM

## 2014-10-06 LAB — URINALYSIS, ROUTINE W REFLEX MICROSCOPIC
BILIRUBIN URINE: NEGATIVE
Glucose, UA: NEGATIVE mg/dL
Hgb urine dipstick: NEGATIVE
Ketones, ur: NEGATIVE mg/dL
NITRITE: NEGATIVE
PH: 8 (ref 5.0–8.0)
PROTEIN: NEGATIVE mg/dL
Specific Gravity, Urine: 1.014 (ref 1.005–1.030)
Urobilinogen, UA: 2 mg/dL — ABNORMAL HIGH (ref 0.0–1.0)

## 2014-10-06 LAB — CBC WITH DIFFERENTIAL/PLATELET
Basophils Absolute: 0 10*3/uL (ref 0.0–0.1)
Basophils Relative: 1 % (ref 0–1)
EOS PCT: 7 % — AB (ref 0–5)
Eosinophils Absolute: 0.4 10*3/uL (ref 0.0–0.7)
HEMATOCRIT: 38.2 % (ref 36.0–46.0)
Hemoglobin: 13.2 g/dL (ref 12.0–15.0)
LYMPHS PCT: 30 % (ref 12–46)
Lymphs Abs: 1.8 10*3/uL (ref 0.7–4.0)
MCH: 28.8 pg (ref 26.0–34.0)
MCHC: 34.6 g/dL (ref 30.0–36.0)
MCV: 83.2 fL (ref 78.0–100.0)
MONO ABS: 0.6 10*3/uL (ref 0.1–1.0)
Monocytes Relative: 9 % (ref 3–12)
Neutro Abs: 3.3 10*3/uL (ref 1.7–7.7)
Neutrophils Relative %: 53 % (ref 43–77)
PLATELETS: 201 10*3/uL (ref 150–400)
RBC: 4.59 MIL/uL (ref 3.87–5.11)
RDW: 12.7 % (ref 11.5–15.5)
WBC: 6.1 10*3/uL (ref 4.0–10.5)

## 2014-10-06 LAB — COMPREHENSIVE METABOLIC PANEL
ALBUMIN: 3.3 g/dL — AB (ref 3.5–5.2)
ALK PHOS: 93 U/L (ref 39–117)
ALT: 12 U/L (ref 0–35)
AST: 14 U/L (ref 0–37)
Anion gap: 13 (ref 5–15)
BILIRUBIN TOTAL: 0.6 mg/dL (ref 0.3–1.2)
BUN: 8 mg/dL (ref 6–23)
CHLORIDE: 101 meq/L (ref 96–112)
CO2: 24 meq/L (ref 19–32)
CREATININE: 1.05 mg/dL (ref 0.50–1.10)
Calcium: 8.9 mg/dL (ref 8.4–10.5)
GFR calc Af Amer: 75 mL/min — ABNORMAL LOW (ref >90)
GFR, EST NON AFRICAN AMERICAN: 65 mL/min — AB (ref >90)
Glucose, Bld: 110 mg/dL — ABNORMAL HIGH (ref 70–99)
POTASSIUM: 4.2 meq/L (ref 3.7–5.3)
SODIUM: 138 meq/L (ref 137–147)
Total Protein: 6.8 g/dL (ref 6.0–8.3)

## 2014-10-06 LAB — URINE MICROSCOPIC-ADD ON

## 2014-10-06 LAB — POC URINE PREG, ED: Preg Test, Ur: NEGATIVE

## 2014-10-06 MED ORDER — HYDROCODONE-ACETAMINOPHEN 5-325 MG PO TABS
1.0000 | ORAL_TABLET | Freq: Once | ORAL | Status: AC
  Administered 2014-10-06: 1 via ORAL
  Filled 2014-10-06: qty 1

## 2014-10-06 MED ORDER — SULFAMETHOXAZOLE-TRIMETHOPRIM 800-160 MG PO TABS
1.0000 | ORAL_TABLET | Freq: Two times a day (BID) | ORAL | Status: DC
Start: 2014-10-06 — End: 2015-09-13

## 2014-10-06 MED ORDER — ONDANSETRON 4 MG PO TBDP
4.0000 mg | ORAL_TABLET | Freq: Once | ORAL | Status: AC
  Administered 2014-10-06: 4 mg via ORAL
  Filled 2014-10-06: qty 1

## 2014-10-06 MED ORDER — ONDANSETRON HCL 4 MG PO TABS: 4.0000 mg | ORAL_TABLET | Freq: Four times a day (QID) | ORAL | Status: DC

## 2014-10-06 MED ORDER — SULFAMETHOXAZOLE-TRIMETHOPRIM 800-160 MG PO TABS
1.0000 | ORAL_TABLET | Freq: Once | ORAL | Status: AC
  Administered 2014-10-06: 1 via ORAL
  Filled 2014-10-06: qty 1

## 2014-10-06 MED ORDER — ALBUTEROL SULFATE HFA 108 (90 BASE) MCG/ACT IN AERS
2.0000 | INHALATION_SPRAY | RESPIRATORY_TRACT | Status: DC | PRN
  Administered 2014-10-06: 2 via RESPIRATORY_TRACT
  Filled 2014-10-06: qty 6.7

## 2014-10-06 MED ORDER — IPRATROPIUM BROMIDE 0.02 % IN SOLN
0.5000 mg | Freq: Once | RESPIRATORY_TRACT | Status: AC
  Administered 2014-10-06: 0.5 mg via RESPIRATORY_TRACT
  Filled 2014-10-06: qty 2.5

## 2014-10-06 MED ORDER — ALBUTEROL SULFATE (2.5 MG/3ML) 0.083% IN NEBU
5.0000 mg | INHALATION_SOLUTION | Freq: Once | RESPIRATORY_TRACT | Status: AC
  Administered 2014-10-06: 5 mg via RESPIRATORY_TRACT
  Filled 2014-10-06: qty 6

## 2014-10-06 NOTE — Discharge Instructions (Signed)
Your blood work in the ED looks pretty good. Your urine does have some infection in it, but I will send it out for more testing to see if you need antibiotics for it. I believe that your nausea, vomiting and diarrhea is related to the stomach bug that is going around. Your coughing is most likely due to a virus as well. The infection on your stomach will need antibiotics. The antibiotics name is Bactrim. This will also help treat the bacteria in your urine. Please be mindful of keeping your stomach dry, baby powders to areas that may be more moist will probably help decrease the infection and abscesses.    Cellulitis Cellulitis is an infection of the skin and the tissue beneath it. The infected area is usually red and tender. Cellulitis occurs most often in the arms and lower legs.  CAUSES  Cellulitis is caused by bacteria that enter the skin through cracks or cuts in the skin. The most common types of bacteria that cause cellulitis are staphylococci and streptococci. SIGNS AND SYMPTOMS   Redness and warmth.  Swelling.  Tenderness or pain.  Fever. DIAGNOSIS  Your health care provider can usually determine what is wrong based on a physical exam. Blood tests may also be done. TREATMENT  Treatment usually involves taking an antibiotic medicine. HOME CARE INSTRUCTIONS   Take your antibiotic medicine as directed by your health care provider. Finish the antibiotic even if you start to feel better.  Keep the infected arm or leg elevated to reduce swelling.  Apply a warm cloth to the affected area up to 4 times per day to relieve pain.  Take medicines only as directed by your health care provider.  Keep all follow-up visits as directed by your health care provider. SEEK MEDICAL CARE IF:   You notice red streaks coming from the infected area.  Your red area gets larger or turns dark in color.  Your bone or joint underneath the infected area becomes painful after the skin has  healed.  Your infection returns in the same area or another area.  You notice a swollen bump in the infected area.  You develop new symptoms.  You have a fever. SEEK IMMEDIATE MEDICAL CARE IF:   You feel very sleepy.  You develop vomiting or diarrhea.  You have a general ill feeling (malaise) with muscle aches and pains. MAKE SURE YOU:   Understand these instructions.  Will watch your condition.  Will get help right away if you are not doing well or get worse. Document Released: 07/24/2005 Document Revised: 02/28/2014 Document Reviewed: 12/30/2011 [facility] Patient Information 2015 [place], [place]. This information is not intended to replace advice given to you by your health care provider. Make sure you discuss any questions you have with your health care provider.  Nausea and Vomiting Nausea is a sick feeling that often comes before throwing up (vomiting). Vomiting is a reflex where stomach contents come out of your mouth. Vomiting can cause severe loss of body fluids (dehydration). Children and elderly adults can become dehydrated quickly, especially if they also have diarrhea. Nausea and vomiting are symptoms of a condition or disease. It is important to find the cause of your symptoms. CAUSES   Direct irritation of the stomach lining. This irritation can result from increased acid production (gastroesophageal reflux disease), infection, food poisoning, taking certain medicines (such as nonsteroidal anti-inflammatory drugs), alcohol use, or tobacco use.  Signals from the brain.These signals could be caused by a headache, heat exposure,  an inner ear disturbance, increased pressure in the brain from injury, infection, a tumor, or a concussion, pain, emotional stimulus, or metabolic problems.  An obstruction in the gastrointestinal tract (bowel obstruction).  Illnesses such as diabetes, hepatitis, gallbladder problems, appendicitis, kidney problems, cancer, sepsis, atypical  symptoms of a heart attack, or eating disorders.  Medical treatments such as chemotherapy and radiation.  Receiving medicine that makes you sleep (general anesthetic) during surgery. DIAGNOSIS Your caregiver may ask for tests to be done if the problems do not improve after a few days. Tests may also be done if symptoms are severe or if the reason for the nausea and vomiting is not clear. Tests may include:  Urine tests.  Blood tests.  Stool tests.  Cultures (to look for evidence of infection).  X-rays or other imaging studies. Test results can help your caregiver make decisions about treatment or the need for additional tests. TREATMENT You need to stay well hydrated. Drink frequently but in small amounts.You may wish to drink water, sports drinks, clear broth, or eat frozen ice pops or gelatin dessert to help stay hydrated.When you eat, eating slowly may help prevent nausea.There are also some antinausea medicines that may help prevent nausea. HOME CARE INSTRUCTIONS   Take all medicine as directed by your caregiver.  If you do not have an appetite, do not force yourself to eat. However, you must continue to drink fluids.  If you have an appetite, eat a normal diet unless your caregiver tells you differently.  Eat a variety of complex carbohydrates (rice, wheat, potatoes, bread), lean meats, yogurt, fruits, and vegetables.  Avoid high-fat foods because they are more difficult to digest.  Drink enough water and fluids to keep your urine clear or pale yellow.  If you are dehydrated, ask your caregiver for specific rehydration instructions. Signs of dehydration may include:  Severe thirst.  Dry lips and mouth.  Dizziness.  Dark urine.  Decreasing urine frequency and amount.  Confusion.  Rapid breathing or pulse. SEEK IMMEDIATE MEDICAL CARE IF:   You have blood or brown flecks (like coffee grounds) in your vomit.  You have black or bloody stools.  You have a  severe headache or stiff neck.  You are confused.  You have severe abdominal pain.  You have chest pain or trouble breathing.  You do not urinate at least once every 8 hours.  You develop cold or clammy skin.  You continue to vomit for longer than 24 to 48 hours.  You have a fever. MAKE SURE YOU:   Understand these instructions.  Will watch your condition.  Will get help right away if you are not doing well or get worse. Document Released: 10/14/2005 Document Revised: 01/06/2012 Document Reviewed: 03/13/2011 [facility] Patient Information 2015 [place], [place]. This information is not intended to replace advice given to you by your health care provider. Make sure you discuss any questions you have with your health care provider.

## 2014-10-06 NOTE — ED Notes (Signed)
Pt at xray

## 2014-10-06 NOTE — ED Provider Notes (Signed)
CSN: [id number]     Arrival date & time 10/06/14  1217 History   First MD Initiated Contact with Patient 10/06/14 1544     Chief Complaint  Patient presents with  . Abdominal Pain  . Cough  . Emesis  . Diarrhea     (Consider location/radiation/quality/duration/timing/severity/associated sxs/prior Treatment) HPI   Patient to the ED for multiple complaints. She has been having nausea, vomiting, diarrhea for 3 days. 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 She also complains of cough and wheezing for the same amount of time but it has not resolved. She does not have inhaler at home and has not taken any medications for this. She reports having a low grade temp yesterday that resolved with 800 mg Motrin. She also is complaining of rash with pain to her pannus. She reports getting boils here throughout the past year and that they are becoming more and more frequent and painful. Denies vaginal bleeding/discharge, abdominal pains, dysuria, urinary frequency, CP, SOB, weakness, confusion, blurry vision, slurred speech, myalgias..  Past Medical History  Diagnosis Date  . Asthma   . Seasonal allergies    Past Surgical History  Procedure Laterality Date  . Abdominal hysterectomy     History reviewed. No pertinent family history. History  Substance Use Topics  . Smoking status: Current Every Day Smoker -- 0.30 packs/day    Types: Cigarettes  . Smokeless tobacco: Not on file  . Alcohol Use: Yes     Comment: occasion   OB History    No data available     Review of Systems  10 Systems reviewed and are negative for acute change except as noted in the HPI.     Allergies  Review of patient's allergies indicates no known allergies.  Home Medications   Prior to Admission medications   Medication Sig Start Date End Date Taking? Authorizing  Provider  ibuprofen (ADVIL,MOTRIN) 800 MG tablet Take 1 tablet (800 mg total) by mouth 3 (three) times daily. 09/05/14  Yes Yolanda M Hess, PA-C  cyclobenzaprine (FLEXERIL) 10 MG tablet Take 1 tablet (10 mg total) by mouth 2 (two) times daily as needed for muscle spasms. Patient not taking: Reported on 10/06/2014 09/05/14   Carman Ching, PA-C  HYDROcodone-acetaminophen (NORCO/VICODIN) 5-325 MG per tablet Take 1-2 tablets by mouth every 4 (four) hours as needed. Patient not taking: Reported on 10/06/2014 09/05/14   Hessie Diener Hess, PA-C  ondansetron (ZOFRAN) 4 MG tablet Take 1 tablet (4 mg total) by mouth every 6 (six) hours. 10/06/14   Yolanda Roskelley Marilu Favre, PA-C  sulfamethoxazole-trimethoprim (SEPTRA DS) 800-160 MG per tablet Take 1 tablet by mouth every 12 (twelve) hours. 10/06/14   Yolanda Pitz Marilu Favre, PA-C   BP 112/63 mmHg  Pulse 77  Temp(Src) 98.5 F (36.9 C) (Oral)  Resp 18  Ht 5\' 2"  (1.575 m)  Wt 230 lb (104.327 kg)  BMI 42.06 kg/m2  SpO2 99% Physical Exam  Constitutional: She appears well-developed and well-nourished. No distress.  HENT:  Head: Normocephalic and atraumatic.  Eyes: Pupils are equal, round, and reactive to light.  Neck: Normal range of motion. Neck supple.  Cardiovascular: Normal rate and regular rhythm.   Pulmonary/Chest: Effort normal. She has no decreased breath sounds. She has wheezes (coughing on exam). She has no rhonchi. She has no rales.  Abdominal: Soft.  Neurological:  She is alert.  Skin: Skin is warm and dry.     Nursing note and vitals reviewed.   ED Course  Procedures (including critical care time) Labs Review Labs Reviewed  COMPREHENSIVE METABOLIC PANEL - Abnormal; Notable for the following:    Glucose, Bld 110 (*)    Albumin 3.3 (*)    GFR calc non Af Amer 65 (*)    GFR calc Af Amer 75 (*)    All other components within normal limits  CBC WITH DIFFERENTIAL - Abnormal; Notable for the following:    Eosinophils Relative 7 (*)    All other components  within normal limits  URINALYSIS, ROUTINE W REFLEX MICROSCOPIC - Abnormal; Notable for the following:    APPearance CLOUDY (*)    Urobilinogen, UA 2.0 (*)    Leukocytes, UA TRACE (*)    All other components within normal limits  URINE MICROSCOPIC-ADD ON - Abnormal; Notable for the following:    Squamous Epithelial / LPF FEW (*)    Bacteria, UA MANY (*)    All other components within normal limits  URINE CULTURE  POC URINE PREG, ED    Imaging Review Dg Abd Acute W/chest  10/06/2014   CLINICAL DATA:  Cough. Epigastric and periumbilical abdominal pain. Vomiting. Diarrhea. Fever.  EXAM: ACUTE ABDOMEN SERIES (ABDOMEN 2 VIEW & CHEST 1 VIEW)  COMPARISON:  Chest radiograph on 12/21/2011  FINDINGS: There is no evidence of dilated bowel loops or free intraperitoneal air. No radiopaque calculi or other significant radiographic abnormality is seen. Left pelvic phleboliths noted.  Heart size and mediastinal contours are within normal limits. Both lungs are clear.  IMPRESSION: Negative abdominal radiographs.  No acute cardiopulmonary disease.   Electronically Signed   By: Earle Gell M.D.   On: 10/06/2014 17:51     EKG Interpretation None      MDM   Final diagnoses:  Cough  Cellulitis of abdominal wall  Nausea vomiting and diarrhea    Patient has some bacteria in her urine and cellulitis to her pannus- Will try to cover for both with Bactrim. CMP and CBC do not show any concerning findings. Er acute abd w/pelvis, r/o pneumonia or severe constipation.  Rx; albuterol inhaler, Bactrim and Zofran. Pt also given work note to rest for a few days- per her request.  43 y.o.Yolanda White's evaluation in the Emergency Department is complete. It has been determined that no acute conditions requiring further emergency intervention are present at this time. The patient/guardian have been advised of the diagnosis and plan. We have discussed signs and symptoms that warrant return to the ED, such as  changes or worsening in symptoms.  Vital signs are stable at discharge. Filed Vitals:   10/06/14 1759  BP: 112/63  Pulse: 77  Temp:   Resp: 18    Patient/guardian has voiced understanding and agreed to follow-up with the PCP or specialist.     Linus Mako, PA-C 10/06/14 1813  Pamella Pert, MD 10/07/14 2010

## 2014-10-06 NOTE — ED Notes (Signed)
Pt here for diarrhea, vomiting, cough, redness and sores on lower abd area and not feeling well, initial onset yesterday.

## 2014-10-08 LAB — URINE CULTURE

## 2014-10-09 ENCOUNTER — Telehealth: Payer: Self-pay | Admitting: Emergency Medicine

## 2014-10-09 NOTE — Telephone Encounter (Signed)
Post ED Visit - Positive Culture Follow-up  Culture report reviewed by antimicrobial stewardship pharmacist: []  Wes Fer[place], Pharm.D., BCPS []  Heide Guile, Pharm.D., BCPS []  Alycia Rossetti, Pharm.D., BCPS []  [place], [place].D., BCPS, AAHIVP [x]  Legrand Como, Pharm.D., BCPS, AAHIVP []  Isac Sarna, Pharm.D., BCPS  Positive urine culture Treated with Sulfa-Trimeth, organism sensitive to the same and no further patient follow-up is required at this time.  Myrna Blazer 10/09/2014, 12:27 PM

## 2014-10-16 ENCOUNTER — Encounter (HOSPITAL_COMMUNITY): Payer: Self-pay | Admitting: Emergency Medicine

## 2014-10-16 ENCOUNTER — Emergency Department (HOSPITAL_COMMUNITY)
Admission: EM | Admit: 2014-10-16 | Discharge: 2014-10-16 | Disposition: A | Discharge status: Home / Self Care | Payer: Medicaid Other | Attending: Emergency Medicine | Admitting: Emergency Medicine

## 2014-10-16 DIAGNOSIS — Z8709 Personal history of other diseases of the respiratory system: Secondary | ICD-10-CM

## 2014-10-16 DIAGNOSIS — R059 Cough, unspecified: Secondary | ICD-10-CM

## 2014-10-16 DIAGNOSIS — R05 Cough: Secondary | ICD-10-CM

## 2014-10-16 DIAGNOSIS — Z720 Tobacco use: Secondary | ICD-10-CM | POA: Insufficient documentation

## 2014-10-16 DIAGNOSIS — Z791 Long term (current) use of non-steroidal anti-inflammatories (NSAID): Secondary | ICD-10-CM | POA: Insufficient documentation

## 2014-10-16 DIAGNOSIS — G529 Cranial nerve disorder, unspecified: Secondary | ICD-10-CM | POA: Insufficient documentation

## 2014-10-16 DIAGNOSIS — J45901 Unspecified asthma with (acute) exacerbation: Secondary | ICD-10-CM | POA: Insufficient documentation

## 2014-10-16 DIAGNOSIS — J069 Acute upper respiratory infection, unspecified: Secondary | ICD-10-CM

## 2014-10-16 DIAGNOSIS — R1110 Vomiting, unspecified: Secondary | ICD-10-CM | POA: Insufficient documentation

## 2014-10-16 MED ORDER — PREDNISONE 20 MG PO TABS: 60.0000 mg | ORAL_TABLET | Freq: Every day | ORAL | Status: DC

## 2014-10-16 MED ORDER — PREDNISONE 20 MG PO TABS
60.0000 mg | ORAL_TABLET | Freq: Once | ORAL | Status: AC
  Administered 2014-10-16: 60 mg via ORAL
  Filled 2014-10-16: qty 3

## 2014-10-16 MED ORDER — CETIRIZINE-PSEUDOEPHEDRINE ER 5-120 MG PO TB12: 1.0000 | ORAL_TABLET | Freq: Two times a day (BID) | ORAL | Status: DC

## 2014-10-16 NOTE — ED Provider Notes (Signed)
CSN: [id number]     Arrival date & time 10/16/14  0103 History   First MD Initiated Contact with Patient 10/16/14 0122     Chief Complaint  Patient presents with  . Asthma     (Consider location/radiation/quality/duration/timing/severity/associated sxs/prior Treatment) Patient is a 43 y.o. female presenting with asthma. The history is provided by the patient. No language interpreter was used.  Asthma This is a recurrent problem. Associated symptoms include coughing and vomiting. Pertinent negatives include no chills, fever or myalgias. Associated symptoms comments: She reports recurrent wheezing that started today. No fever. She has chest tightness and increased cough. She reports she has been unable to smoke today. She was seen and treated here 10 days ago for wheezing and for an infection on her abdomen but she has been unable to get the antibiotics filled. No fever. She does not have complaint of abdominal rash or pain today. .    Past Medical History  Diagnosis Date  . Asthma   . Seasonal allergies    Past Surgical History  Procedure Laterality Date  . Abdominal hysterectomy     No family history on file. History  Substance Use Topics  . Smoking status: Current Every Day Smoker -- 0.30 packs/day    Types: Cigarettes  . Smokeless tobacco: Not on file  . Alcohol Use: Yes     Comment: occasion   OB History    No data available     Review of Systems  Constitutional: Negative for fever and chills.  HENT: Negative.   Respiratory: Positive for cough, chest tightness and wheezing.   Cardiovascular: Negative.   Gastrointestinal: Positive for vomiting.  Musculoskeletal: Negative.  Negative for myalgias.  Skin: Negative.   Neurological: Negative.       Allergies  Review of patient's allergies indicates no known allergies.  Home Medications   Prior to Admission medications   Medication Sig Start Date End Date Taking? Authorizing Provider  cyclobenzaprine (FLEXERIL)  10 MG tablet Take 1 tablet (10 mg total) by mouth 2 (two) times daily as needed for muscle spasms. Patient not taking: Reported on 10/06/2014 09/05/14   Carman Ching, PA-C  HYDROcodone-acetaminophen (NORCO/VICODIN) 5-325 MG per tablet Take 1-2 tablets by mouth every 4 (four) hours as needed. Patient not taking: Reported on 10/06/2014 09/05/14   Carman Ching, PA-C  ibuprofen (ADVIL,MOTRIN) 800 MG tablet Take 1 tablet (800 mg total) by mouth 3 (three) times daily. 09/05/14   Robyn M Hess, PA-C  ondansetron (ZOFRAN) 4 MG tablet Take 1 tablet (4 mg total) by mouth every 6 (six) hours. 10/06/14   Tiffany Marilu Favre, PA-C  sulfamethoxazole-trimethoprim (SEPTRA DS) 800-160 MG per tablet Take 1 tablet by mouth every 12 (twelve) hours. 10/06/14   Tiffany Marilu Favre, PA-C   BP 114/60 mmHg  Pulse 77  Temp(Src) 98.3 F (36.8 C) (Oral)  Resp 20  SpO2 99% Physical Exam  Constitutional: She is oriented to person, place, and time. She appears well-developed and well-nourished.  HENT:  Head: Normocephalic.  Neck: Normal range of motion. Neck supple.  Cardiovascular: Normal rate and regular rhythm.   Pulmonary/Chest: Effort normal and breath sounds normal. She has no wheezes.  Examined after nebulizer treatment by EMS.  Abdominal: Soft. Bowel sounds are normal. There is no tenderness. There is no rebound and no guarding.  Musculoskeletal: Normal range of motion.  Neurological: She is alert and oriented to person, place, and time. A cranial nerve deficit is present.  Skin: Skin is warm  and dry. No rash noted.  Psychiatric: She has a normal mood and affect.    ED Course  Procedures (including critical care time) Labs Review Labs Reviewed - No data to display  Imaging Review No results found.   EKG Interpretation None      MDM   Final diagnoses:  None    1. Cough 2. URI 3. H/o asthma  She is given one nebulizer treatment by EMS en route to ED and there has been no wheezing in ED. She is  observed for a period of time without recurrent SOB/wheezing. She can be discharged home and is encouraged to stop smoking, get established with a primary care doctor and fill her prescriptions for the recommended medications.    Dewaine Oats, PA-C 10/16/14 [id number]  Everlene Balls, MD 10/16/14 [phone]

## 2014-10-16 NOTE — Discharge Instructions (Signed)
Bronchospasm °A bronchospasm is a spasm or tightening of the airways going into the lungs. During a bronchospasm breathing becomes more difficult because the airways get smaller. When this happens there can be coughing, a whistling sound when breathing (wheezing), and difficulty breathing. Bronchospasm is often associated with asthma, but not all patients who experience a bronchospasm have asthma. °CAUSES  °A bronchospasm is caused by inflammation or irritation of the airways. The inflammation or irritation may be triggered by:  °· Allergies (such as to animals, pollen, food, or mold). Allergens that cause bronchospasm may cause wheezing immediately after exposure or many hours later.   °· Infection. Viral infections are believed to be the most common cause of bronchospasm.   °· Exercise.   °· Irritants (such as pollution, cigarette smoke, strong odors, aerosol sprays, and paint fumes).   °· Weather changes. Winds increase molds and pollens in the air. Rain refreshes the air by washing irritants out. Cold air may cause inflammation.   °· Stress and emotional upset.   °SIGNS AND SYMPTOMS  °· Wheezing.   °· Excessive nighttime coughing.   °· Frequent or severe coughing with a simple cold.   °· Chest tightness.   °· Shortness of breath.   °DIAGNOSIS  °Bronchospasm is usually diagnosed through a history and physical exam. Tests, such as chest X-rays, are sometimes done to look for other conditions. °TREATMENT  °· Inhaled medicines can be given to open up your airways and help you breathe. The medicines can be given using either an inhaler or a nebulizer machine. °· Corticosteroid medicines may be given for severe bronchospasm, usually when it is associated with asthma. °HOME CARE INSTRUCTIONS  °· Always have a plan prepared for seeking medical care. Know when to call your health care provider and local emergency services (911 in the [place] Know where you can access local emergency care. °· Only take medicines as  directed by your health care provider. °· If you were prescribed an inhaler or nebulizer machine, ask your health care provider to explain how to use it correctly. Always use a spacer with your inhaler if you were given one. °· It is necessary to remain calm during an attack. Try to relax and breathe more slowly.  °· Control your home environment in the following ways:   °¨ Change your heating and air conditioning filter at least once a month.   °¨ Limit your use of fireplaces and wood stoves. °¨ Do not smoke and do not allow smoking in your home.   °¨ Avoid exposure to perfumes and fragrances.   °¨ Get rid of pests (such as roaches and mice) and their droppings.   °¨ Throw away plants if you see mold on them.   °¨ Keep your house clean and dust free.   °¨ Replace carpet with wood, tile, or vinyl flooring. Carpet can trap dander and dust.   °¨ Use allergy-proof pillows, mattress covers, and box spring covers.   °¨ Wash bed sheets and blankets every week in hot water and dry them in a dryer.   °¨ Use blankets that are made of polyester or cotton.   °¨ Wash hands frequently. °SEEK MEDICAL CARE IF:  °· You have muscle aches.   °· You have chest pain.   °· The sputum changes from clear or white to yellow, green, gray, or bloody.   °· The sputum you cough up gets thicker.   °· There are problems that may be related to the medicine you are given, such as a rash, itching, swelling, or trouble breathing.   °SEEK IMMEDIATE MEDICAL CARE IF:  °· You have worsening wheezing and coughing even   after taking your prescribed medicines.   °· You have increased difficulty breathing.   °· You develop severe chest pain. °MAKE SURE YOU:  °· Understand these instructions. °· Will watch your condition. °· Will get help right away if you are not doing well or get worse. °Document Released: 10/17/2003 Document Revised: 10/19/2013 Document Reviewed: 04/05/2013 °[employer]® Patient Information ©2015 [employer]. This information is not  intended to replace advice given to you by your health care provider. Make sure you discuss any questions you have with your health care provider. ° °

## 2014-10-16 NOTE — ED Notes (Signed)
Pt reports going to the store yesterday around 2pm and when she returned she began having sob, and a cough. Pt took her albuterol inhaler with no relief, attempted to lie down to see if sx would get better but she began to get worse. Pt was given 7.5 albuterol by ems.

## 2014-10-16 NOTE — ED Notes (Signed)
Pt visibly upset and crying. Pt reports her chest feels tight. Reports pain 6/10, states she feels better after neb tx.

## 2014-11-17 ENCOUNTER — Ambulatory Visit: Payer: Medicaid Other

## 2015-08-08 ENCOUNTER — Emergency Department (HOSPITAL_COMMUNITY): Payer: Medicaid Other

## 2015-08-08 ENCOUNTER — Emergency Department (HOSPITAL_COMMUNITY)
Admission: EM | Admit: 2015-08-08 | Discharge: 2015-08-08 | Disposition: A | Discharge status: Home / Self Care | Payer: Medicaid Other | Attending: Emergency Medicine | Admitting: Emergency Medicine

## 2015-08-08 ENCOUNTER — Encounter (HOSPITAL_COMMUNITY): Payer: Self-pay | Admitting: Emergency Medicine

## 2015-08-08 DIAGNOSIS — N644 Mastodynia: Secondary | ICD-10-CM

## 2015-08-08 DIAGNOSIS — J45901 Unspecified asthma with (acute) exacerbation: Secondary | ICD-10-CM | POA: Insufficient documentation

## 2015-08-08 DIAGNOSIS — F419 Anxiety disorder, unspecified: Secondary | ICD-10-CM | POA: Insufficient documentation

## 2015-08-08 DIAGNOSIS — R110 Nausea: Secondary | ICD-10-CM | POA: Insufficient documentation

## 2015-08-08 DIAGNOSIS — Z720 Tobacco use: Secondary | ICD-10-CM | POA: Insufficient documentation

## 2015-08-08 LAB — CBC
HCT: 39.3 % (ref 36.0–46.0)
HEMOGLOBIN: 13.4 g/dL (ref 12.0–15.0)
MCH: 28.2 pg (ref 26.0–34.0)
MCHC: 34.1 g/dL (ref 30.0–36.0)
MCV: 82.7 fL (ref 78.0–100.0)
Platelets: 228 10*3/uL (ref 150–400)
RBC: 4.75 MIL/uL (ref 3.87–5.11)
RDW: 12.6 % (ref 11.5–15.5)
WBC: 10.1 10*3/uL (ref 4.0–10.5)

## 2015-08-08 LAB — BASIC METABOLIC PANEL
ANION GAP: 8 (ref 5–15)
BUN: 9 mg/dL (ref 6–20)
CALCIUM: 9.2 mg/dL (ref 8.9–10.3)
CO2: 25 mmol/L (ref 22–32)
Chloride: 104 mmol/L (ref 101–111)
Creatinine, Ser: 1.05 mg/dL — ABNORMAL HIGH (ref 0.44–1.00)
GFR calc non Af Amer: >60 mL/min (ref >60)
Glucose, Bld: 105 mg/dL — ABNORMAL HIGH (ref 65–99)
Potassium: 3.7 mmol/L (ref 3.5–5.1)
SODIUM: 137 mmol/L (ref 135–145)

## 2015-08-08 LAB — I-STAT TROPONIN, ED: TROPONIN I, POC: 0 ng/mL (ref 0.00–0.08)

## 2015-08-08 MED ORDER — KETOROLAC TROMETHAMINE 60 MG/2ML IM SOLN
30.0000 mg | Freq: Once | INTRAMUSCULAR | Status: AC
  Administered 2015-08-08: 30 mg via INTRAMUSCULAR
  Filled 2015-08-08: qty 2

## 2015-08-08 MED ORDER — OXYCODONE-ACETAMINOPHEN 5-325 MG PO TABS: 1.0000 | ORAL_TABLET | Freq: Four times a day (QID) | ORAL | Status: DC | PRN

## 2015-08-08 MED ORDER — NAPROXEN 500 MG PO TABS
500.0000 mg | ORAL_TABLET | Freq: Two times a day (BID) | ORAL | Status: DC
Start: 2015-08-08 — End: 2015-09-18

## 2015-08-08 MED ORDER — OXYCODONE-ACETAMINOPHEN 5-325 MG PO TABS
2.0000 | ORAL_TABLET | Freq: Once | ORAL | Status: AC
  Administered 2015-08-08: 2 via ORAL
  Filled 2015-08-08: qty 2

## 2015-08-08 NOTE — ED Notes (Signed)
Patient transported to X-ray 

## 2015-08-08 NOTE — Discharge Instructions (Signed)
Breast Tenderness Breast tenderness is a common problem for women of all ages. Breast tenderness may cause mild discomfort to severe pain. It has a variety of causes. Your health care provider will find out the likely cause of your breast tenderness by examining your breasts, asking you about symptoms, and ordering some tests. Breast tenderness usually does not mean you have breast cancer. HOME CARE INSTRUCTIONS  Breast tenderness often can be handled at home. You can try: 1. Getting fitted for a new bra that provides more support, especially during exercise. 2. Wearing a more supportive bra or sports bra while sleeping when your breasts are very tender. 3. If you have a breast injury, apply ice to the area: 1. Put ice in a plastic bag. 2. Place a towel between your skin and the bag. 3. Leave the ice on for 20 minutes, 2-3 times a day. 4. If your breasts are too full of milk as a result of breastfeeding, try: 1. Expressing milk either by hand or with a breast pump. 2. Applying a warm compress to the breasts for relief. 5. Taking over-the-counter pain relievers, if approved by your health care provider. 6. Taking other medicines that your health care provider prescribes. These may include antibiotic medicines or birth control pills. Over the long term, your breast tenderness might be eased if you:  Cut down on caffeine.  Reduce the amount of fat in your diet. Keep a log of the days and times when your breasts are most tender. This will help you and your health care provider find the cause of the tenderness and how to relieve it. Also, learn how to do breast exams at home. This will help you notice if you have an unusual growth or lump that could cause tenderness. SEEK MEDICAL CARE IF:   Any part of your breast is hard, red, and hot to the touch. This could be a sign of infection.  Fluid is coming out of your nipples (and you are not breastfeeding). Especially watch for blood or pus.  You  have a fever as well as breast tenderness.  You have a new or painful lump in your breast that remains after your menstrual period ends.  You have tried to take care of the pain at home, but it has not gone away.  Your breast pain is getting worse, or the pain is making it hard to do the things you usually do during your day.   This information is not intended to replace advice given to you by your health care provider. Make sure you discuss any questions you have with your health care provider.   Document Released: 09/26/2008 Document Revised: 06/16/2013 Document Reviewed: 05/13/2013 Elsevier Interactive Patient Education 2016 [place] Practicing breast self-awareness may pick up problems early, prevent significant medical complications, and possibly save your life. By practicing breast self-awareness, you can become familiar with how your breasts look and feel and if your breasts are changing. This allows you to notice changes early. It can also offer you some reassurance that your breast health is good. One way to learn what is normal for your breasts and whether your breasts are changing is to do a breast self-exam. If you find a lump or something that was not present in the past, it is best to contact your caregiver right away. Other findings that should be evaluated by your caregiver include nipple discharge, especially if it is bloody; skin changes or reddening; areas where the skin seems  to be pulled in (retracted); or new lumps and bumps. Breast pain is seldom associated with cancer (malignancy), but should also be evaluated by a caregiver. HOW TO PERFORM A BREAST SELF-EXAM The best time to examine your breasts is 5-7 days after your menstrual period is over. During menstruation, the breasts are lumpier, and it may be more difficult to pick up changes. If you do not menstruate, have reached menopause, or had your uterus removed (hysterectomy), you should examine  your breasts at regular intervals, such as monthly. If you are breastfeeding, examine your breasts after a feeding or after using a breast pump. Breast implants do not decrease the risk for lumps or tumors, so continue to perform breast self-exams as recommended. Talk to your caregiver about how to determine the difference between the implant and breast tissue. Also, talk about the amount of pressure you should use during the exam. Over time, you will become more familiar with the variations of your breasts and more comfortable with the exam. A breast self-exam requires you to remove all your clothes above the waist. 7. Look at your breasts and nipples. Stand in front of a mirror in a room with good lighting. With your hands on your hips, push your hands firmly downward. Look for a difference in shape, contour, and size from one breast to the other (asymmetry). Asymmetry includes puckers, dips, or bumps. Also, look for skin changes, such as reddened or scaly areas on the breasts. Look for nipple changes, such as discharge, dimpling, repositioning, or redness. 8. Carefully feel your breasts. This is best done either in the shower or tub while using soapy water or when flat on your back. Place the arm (on the side of the breast you are examining) above your head. Use the pads (not the fingertips) of your three middle fingers on your opposite hand to feel your breasts. Start in the underarm area and use  inch (2 cm) overlapping circles to feel your breast. Use 3 different levels of pressure (light, medium, and firm pressure) at each circle before moving to the next circle. The light pressure is needed to feel the tissue closest to the skin. The medium pressure will help to feel breast tissue a little deeper, while the firm pressure is needed to feel the tissue close to the ribs. Continue the overlapping circles, moving downward over the breast until you feel your ribs below your breast. Then, move one finger-width  towards the center of the body. Continue to use the  inch (2 cm) overlapping circles to feel your breast as you move slowly up toward the collar bone (clavicle) near the base of the neck. Continue the up and down exam using all 3 pressures until you reach the middle of the chest. Do this with each breast, carefully feeling for lumps or changes. 9.  Keep a written record with breast changes or normal findings for each breast. By writing this information down, you do not need to depend only on memory for size, tenderness, or location. Write down where you are in your menstrual cycle, if you are still menstruating. Breast tissue can have some lumps or thick tissue. However, see your caregiver if you find anything that concerns you.  SEEK MEDICAL CARE IF:  You see a change in shape, contour, or size of your breasts or nipples.   You see skin changes, such as reddened or scaly areas on the breasts or nipples.   You have an unusual discharge from your nipples.  You feel a new lump or unusually thick areas.    This information is not intended to replace advice given to you by your health care provider. Make sure you discuss any questions you have with your health care provider.   Document Released: 10/14/2005 Document Revised: 09/30/2012 Document Reviewed: 01/29/2012 Elsevier Interactive Patient Education 2016 [place].   Breast Ultrasound Breast ultrasound, or breast sonography, is a procedure to check for breast problems. A breast ultrasound uses harmless sound waves to create pictures of the inside of your breast.  This procedure can help determine whether a lump in your breast is a fluid-filled sac (cyst), a solid tumor, or a growth. It can also help determine whether a tumor is cancerous or noncancerous (benign). Sometimes a breast ultrasound is done to locate nodules in the breast that are too small to feel but need to be removed during surgery. A breast ultrasound takes about 30  minutes. LET [facility] CARE PROVIDER KNOW ABOUT: 10. Any allergies you have. 11. All medicines you are taking, including vitamins, herbs, eye drops, creams, and over-the-counter medicines. 12. Any blood disorders you have. 13. Previous surgeries you have had. 14. Medical conditions you have. RISKS AND COMPLICATIONS  Breast ultrasound is safe and painless. Ultrasound imaging does not use X-rays. There are no known risks for this procedure. BEFORE THE PROCEDURE You do not need to prepare for a breast ultrasound. You will undress from your waist up, so wear loose, comfortable clothing on the day of the procedure. PROCEDURE   You will lie down on an examining table.  A health care provider will apply warm gel to your breast area.  You may be asked to hold your arm above your head.  The health care provider will move a handheld probe, which looks like a microphone, back and forth over your breast.  The probe will send signals to a computer that will create images of your breast.  When the procedure is over, the gel will be cleaned from your breast, and you can get dressed. AFTER THE PROCEDURE You can go home right away and do all your usual activities. Ask when the results of your exam will be ready. Make sure you get your results.   This information is not intended to replace advice given to you by your health care provider. Make sure you discuss any questions you have with your health care provider.   Document Released: 11/05/2004 Document Revised: 10/19/2013 Document Reviewed: 08/10/2013 Elsevier Interactive Patient Education [employer].

## 2015-08-08 NOTE — ED Provider Notes (Signed)
CSN: [id number]     Arrival date & time 08/08/15  0249 History   First MD Initiated Contact with Patient 08/08/15 0325     Chief Complaint  Patient presents with  . Chest Pain     (Consider location/radiation/quality/duration/timing/severity/associated sxs/prior Treatment) HPI Comments: 44 year old female with a history of asthma and seasonal allergies presents to the emergency department for further evaluation of left breast pain. Patient states that pain acutely worsened approximately 2 hours ago while she was at work. She reports onset of symptoms 3 days ago. She states that she had intermittent pain in her left breast which "felt like someone was sticking me with a needle". This pain was coming and going, but became constant and severely worse the 2 hours prior to arrival. Patient states that pain is worse with certain movements and when her breast is unsupported. She states that she has noted stippling to the skin at the site of her pain which is new. Patient refers to the area as "a hole". She denies breastfeeding, associated redness, nipple discharge or inversion, or drainage or swelling from the site of pain. Patient denies any recent surgeries or hospitalizations as well as any prolonged travel. She has had no associated leg swelling. No personal history of hypertension, diabetes, or dyslipidemia. She states that she is concerned because her father recently died of lung cancer. Patient is a smoker.  Patient is a 44 y.o. female presenting with chest pain. The history is provided by the patient. No language interpreter was used.  Chest Pain Associated symptoms: nausea and shortness of breath (from worsening pain)   Associated symptoms: no fever, not vomiting and no weakness     Past Medical History  Diagnosis Date  . Asthma   . Seasonal allergies    Past Surgical History  Procedure Laterality Date  . Abdominal hysterectomy     No family history on file. Social History  Substance  Use Topics  . Smoking status: Current Every Day Smoker -- 0.30 packs/day    Types: Cigarettes  . Smokeless tobacco: None  . Alcohol Use: Yes   OB History    No data available      Review of Systems  Constitutional: Negative for fever.  Respiratory: Positive for shortness of breath (from worsening pain).   Cardiovascular: Positive for chest pain (left breast pain).  Gastrointestinal: Positive for nausea. Negative for vomiting.  Neurological: Negative for syncope and weakness.  All other systems reviewed and are negative.   Allergies  Review of patient's allergies indicates no known allergies.  Home Medications   Prior to Admission medications   Medication Sig Start Date End Date Taking? Authorizing Provider  cetirizine-pseudoephedrine (ZYRTEC-D) 5-120 MG per tablet Take 1 tablet by mouth 2 (two) times daily. Patient not taking: Reported on 08/08/2015 10/16/14   Charlann Lange, PA-C  cyclobenzaprine (FLEXERIL) 10 MG tablet Take 1 tablet (10 mg total) by mouth 2 (two) times daily as needed for muscle spasms. Patient not taking: Reported on 10/06/2014 09/05/14   Carman Ching, PA-C  HYDROcodone-acetaminophen (NORCO/VICODIN) 5-325 MG per tablet Take 1-2 tablets by mouth every 4 (four) hours as needed. Patient not taking: Reported on 10/16/2014 09/05/14   Carman Ching, PA-C  ibuprofen (ADVIL,MOTRIN) 800 MG tablet Take 1 tablet (800 mg total) by mouth 3 (three) times daily. Patient not taking: Reported on 08/08/2015 09/05/14   Hessie Diener Hess, PA-C  ondansetron (ZOFRAN) 4 MG tablet Take 1 tablet (4 mg total) by mouth every 6 (six)  hours. Patient not taking: Reported on 10/16/2014 10/06/14   Delos Haring, PA-C  predniSONE (DELTASONE) 20 MG tablet Take 3 tablets (60 mg total) by mouth daily. Patient not taking: Reported on 08/08/2015 10/16/14   Charlann Lange, PA-C  sulfamethoxazole-trimethoprim (SEPTRA DS) 800-160 MG per tablet Take 1 tablet by mouth every 12 (twelve) hours. Patient not  taking: Reported on 10/16/2014 10/06/14   Delos Haring, PA-C   BP 115/61 mmHg  Pulse 71  Temp(Src) 97.9 F (36.6 C) (Oral)  Resp 15  Ht 5\' 2"  (1.575 m)  Wt 237 lb 6 oz (107.673 kg)  BMI 43.41 kg/m2  SpO2 98%   Physical Exam  Constitutional: She is oriented to person, place, and time. She appears well-developed and well-nourished. No distress.  Nontoxic/nonseptic appearing  HENT:  Head: Normocephalic and atraumatic.  Eyes: Conjunctivae and EOM are normal. No scleral icterus.  Neck: Normal range of motion.  Cardiovascular: Normal rate, regular rhythm and intact distal pulses.   Pulmonary/Chest: Effort normal. No respiratory distress. She has no wheezes. She exhibits no deformity. Right breast exhibits no inverted nipple, no nipple discharge, no skin change and no tenderness. Left breast exhibits skin change and tenderness. Left breast exhibits no inverted nipple and no nipple discharge.    Respirations even and unlabored.  Musculoskeletal: Normal range of motion.  Neurological: She is alert and oriented to person, place, and time. She exhibits normal muscle tone. Coordination normal.  Skin: Skin is warm and dry. No rash noted. She is not diaphoretic. No erythema. No pallor.  Psychiatric: Her behavior is normal. Her mood appears anxious.  Nursing note and vitals reviewed.   ED Course  Procedures (including critical care time) Labs Review Labs Reviewed  BASIC METABOLIC PANEL - Abnormal; Notable for the following:    Glucose, Bld 105 (*)    Creatinine, Ser 1.05 (*)    All other components within normal limits  CBC  I-STAT TROPOININ, ED    Imaging Review Dg Chest 2 View  08/08/2015   CLINICAL DATA:  Left breast pain. Pain is worsening over the past few days. There is been a divot or dip in the left breast for 1 month.  EXAM: CHEST  2 VIEW  COMPARISON:  10/06/2014  FINDINGS: The heart size and mediastinal contours are within normal limits. Both lungs are clear. The visualized  skeletal structures are unremarkable.  IMPRESSION: No active cardiopulmonary disease.   Electronically Signed   By: Lucienne Capers M.D.   On: 08/08/2015 04:15     I have personally reviewed and evaluated these images and lab results as part of my medical decision-making.   EKG Interpretation   Date/Time:  Tuesday August 08 2015 02:55:12 EDT Ventricular Rate:  74 PR Interval:  146 QRS Duration: 80 QT Interval:  396 QTC Calculation: 439 R Axis:   62 Text Interpretation:  Normal sinus rhythm Low voltage QRS Cannot rule out  Anterior infarct , age undetermined Abnormal ECG No significant change  since last tracing Confirmed by WARD,  DO, KRISTEN ([id number]) on 08/08/2015  4:21:46 AM      MDM   Final diagnoses:  Pain of left breast    44 year old female percent so the emergency department for further evaluation of pain to her left breast. Pain is reproducible on palpation. There are some skin changes noted at site of pain, but no evidence of abscess or cellulitis. No nipple inversion or discharge. Symptoms atypical of ACS. Cardiac workup today is noncontributory. Chest x-ray shows no  evidence of any acute cardiopulmonary abnormalities. Symptoms also atypical for pulmonary embolus; patient is PERC negative.   Patient expresses most concern over breast cancer. Have explained to the patient that she would require mammography or ultrasound for further evaluation of this. Given that pain is localized to the breast, [facility]. Naproxen and Percocet prescribed for pain control. Have advised the use of a supportive bra to try and relieve pain. Return precautions discussed and provided. Patient agreeable to plan with no unaddressed concerns. Patient discharged in good condition.   Filed Vitals:   08/08/15 0345 08/08/15 0428 08/08/15 0500 08/08/15 0530  BP: 112/72 106/67 96/69 98/61   Pulse: 65 62 67 65  Temp:      TempSrc:      Resp: 15 12 15 13   Height:       Weight:      SpO2: 99% 98% 95% 96%       Antonietta Breach, PA-C 08/19/15 [place], DO 08/19/15 [id number]

## 2015-08-08 NOTE — ED Notes (Signed)
PA at the bedside.

## 2015-08-08 NOTE — ED Notes (Signed)
Pt. reports intermittent left chest pain with SOB and nausea onset 3 days ago with dizziness, denies diaphoresis .

## 2015-08-15 ENCOUNTER — Other Ambulatory Visit (HOSPITAL_COMMUNITY): Payer: Self-pay | Admitting: *Deleted

## 2015-08-15 DIAGNOSIS — N6320 Unspecified lump in the left breast, unspecified quadrant: Secondary | ICD-10-CM

## 2015-08-25 ENCOUNTER — Ambulatory Visit (HOSPITAL_COMMUNITY)
Admission: RE | Admit: 2015-08-25 | Discharge: 2015-08-25 | Disposition: A | Discharge status: Home / Self Care | Payer: Medicaid Other | Source: Ambulatory Visit | Attending: Obstetrics and Gynecology | Admitting: Obstetrics and Gynecology

## 2015-08-25 ENCOUNTER — Ambulatory Visit
Admission: RE | Admit: 2015-08-25 | Discharge: 2015-08-25 | Disposition: A | Discharge status: Home / Self Care | Payer: No Typology Code available for payment source | Source: Ambulatory Visit | Attending: Obstetrics and Gynecology | Admitting: Obstetrics and Gynecology

## 2015-08-25 ENCOUNTER — Encounter (HOSPITAL_COMMUNITY): Payer: Self-pay

## 2015-08-25 ENCOUNTER — Other Ambulatory Visit (HOSPITAL_COMMUNITY): Payer: Self-pay | Admitting: Obstetrics and Gynecology

## 2015-08-25 VITALS — BP 124/76 | Temp 98.3°F | Ht 62.0 in | Wt 237.0 lb

## 2015-08-25 DIAGNOSIS — N6320 Unspecified lump in the left breast, unspecified quadrant: Secondary | ICD-10-CM

## 2015-08-25 DIAGNOSIS — N6321 Unspecified lump in the left breast, upper outer quadrant: Secondary | ICD-10-CM

## 2015-08-25 DIAGNOSIS — N6452 Nipple discharge: Secondary | ICD-10-CM | POA: Insufficient documentation

## 2015-08-25 DIAGNOSIS — Z1239 Encounter for other screening for malignant neoplasm of breast: Secondary | ICD-10-CM

## 2015-08-25 DIAGNOSIS — N63 Unspecified lump in breast: Secondary | ICD-10-CM | POA: Insufficient documentation

## 2015-08-25 NOTE — Addendum Note (Signed)
Encounter addended by: Loletta Parish, RN on: 08/25/2015  4:21 PM<BR>     Documentation filed: Patient Instructions Section

## 2015-08-25 NOTE — Progress Notes (Addendum)
Complaints of left breast that per patient looks like a sink hole. Patient states that it sunk in more since first noticed 1-2 months ago. Patient states the lump is painful at times. Patient rated the pain at a 10 out of 10 stating the pain is worse when lays on it or touches the area. Patient complained that she has noticed some thick white discharge   Pap Smear:  Pap smear not completed today. Last Pap smear was in 2010 and normal per patient. Per patient has no history of an abnormal Pap smear. Patient has a history of a hysterectomy 08/04/2009 for fibroids. Patient no longer needs Pap smears due to her history of a hysterectomy for benign reasons per BCCCP and ACOG guidelines. No Pap smear results in EPIC.   Physical exam: Breasts Breasts symmetrical. No skin abnormalities right breast. Indention within left breast above areola that per patient first noticed 1-2 months ago. No nipple retraction bilateral breasts. No nipple discharge right breast. Milky colored nipple discharge expressed from left breast on exam. Sample sent to cytology. No lymphadenopathy. No lumps palpated right breast. Palpated a lump within the left breast a 1 o'clock next to the areola. Complaints of tenderness when palpated left breast lump. Referred patient to the [facility] for diagnostic mammogram. Appointment scheduled for Friday, August 25, 2015 at 0945.   Pelvic/Bimanual No Pap smear completed today since patient has a history of a hysterectomy for benign reasons. Pap smear not indicated per BCCCP guidelines.   Smoking Cessation discussed with patient. Referred patient to the [facility] Quitline and gave resources to free smoking cessation classes offered at the [facility].

## 2015-08-25 NOTE — Addendum Note (Signed)
Encounter addended by: Loletta Parish, RN on: 08/25/2015 12:41 PM<BR>     Documentation filed: Notes Section

## 2015-08-25 NOTE — Patient Instructions (Addendum)
Educational materials on self breast awareness given. Explained to Yolanda White that she does not need any further Pap smears due to her history of a hysterectomy for benign reasons. Referred patient to the [facility] for diagnostic mammogram. Appointment scheduled for Friday, August 25, 2015 at 0945. Patient aware of appointment and will be there. Smoking Cessation discussed with patient. Referred patient to the [facility] Quitline and gave resources to free smoking cessation classes offered at the [facility]. Let patient know will follow up with her within the next couple weeks with results of breast discharge by phone. Yolanda White verbalized understanding.  Jeffrey Voth, Arvil Ch[place], RN 8:30 AM

## 2015-08-31 ENCOUNTER — Other Ambulatory Visit (HOSPITAL_COMMUNITY): Payer: Self-pay | Admitting: Obstetrics and Gynecology

## 2015-08-31 DIAGNOSIS — N6320 Unspecified lump in the left breast, unspecified quadrant: Secondary | ICD-10-CM

## 2015-09-01 ENCOUNTER — Ambulatory Visit
Admission: RE | Admit: 2015-09-01 | Discharge: 2015-09-01 | Disposition: A | Discharge status: Home / Self Care | Payer: No Typology Code available for payment source | Source: Ambulatory Visit | Attending: Obstetrics and Gynecology | Admitting: Obstetrics and Gynecology

## 2015-09-01 DIAGNOSIS — N6320 Unspecified lump in the left breast, unspecified quadrant: Secondary | ICD-10-CM

## 2015-09-05 ENCOUNTER — Telehealth: Payer: Self-pay | Admitting: *Deleted

## 2015-09-05 NOTE — Telephone Encounter (Signed)
Left vm for pt to return call regarding [place] for 09/13/15. Contact information provided.

## 2015-09-08 ENCOUNTER — Telehealth: Payer: Self-pay | Admitting: *Deleted

## 2015-09-08 ENCOUNTER — Encounter: Payer: Self-pay | Admitting: *Deleted

## 2015-09-08 DIAGNOSIS — C50212 Malignant neoplasm of upper-inner quadrant of left female breast: Secondary | ICD-10-CM | POA: Insufficient documentation

## 2015-09-08 HISTORY — DX: Malignant neoplasm of upper-inner quadrant of left female breast: C50.212

## 2015-09-08 NOTE — Telephone Encounter (Signed)
Confirmed [facility] for 09/13/15 at 0830 .  Instructions and contact information given. 

## 2015-09-13 ENCOUNTER — Ambulatory Visit (HOSPITAL_BASED_OUTPATIENT_CLINIC_OR_DEPARTMENT_OTHER): Payer: Medicaid Other | Admitting: Hematology

## 2015-09-13 ENCOUNTER — Encounter: Payer: Self-pay | Admitting: Skilled Nursing Facility1

## 2015-09-13 ENCOUNTER — Other Ambulatory Visit: Payer: Self-pay | Admitting: *Deleted

## 2015-09-13 ENCOUNTER — Ambulatory Visit: Payer: Medicaid Other | Attending: General Surgery | Admitting: Physical Therapy

## 2015-09-13 ENCOUNTER — Other Ambulatory Visit: Payer: Self-pay | Admitting: General Surgery

## 2015-09-13 ENCOUNTER — Encounter: Payer: Self-pay | Admitting: Hematology

## 2015-09-13 ENCOUNTER — Encounter: Payer: Self-pay | Admitting: *Deleted

## 2015-09-13 ENCOUNTER — Ambulatory Visit: Payer: Self-pay

## 2015-09-13 ENCOUNTER — Other Ambulatory Visit: Payer: Self-pay

## 2015-09-13 ENCOUNTER — Encounter: Payer: Self-pay | Admitting: Genetic Counselor

## 2015-09-13 ENCOUNTER — Encounter: Payer: Self-pay | Admitting: Physical Therapy

## 2015-09-13 ENCOUNTER — Ambulatory Visit
Admission: RE | Admit: 2015-09-13 | Discharge: 2015-09-13 | Disposition: A | Discharge status: Home / Self Care | Payer: Self-pay | Source: Ambulatory Visit | Attending: Radiation Oncology | Admitting: Radiation Oncology

## 2015-09-13 ENCOUNTER — Ambulatory Visit (HOSPITAL_BASED_OUTPATIENT_CLINIC_OR_DEPARTMENT_OTHER): Payer: Medicaid Other | Admitting: Genetic Counselor

## 2015-09-13 ENCOUNTER — Other Ambulatory Visit (HOSPITAL_BASED_OUTPATIENT_CLINIC_OR_DEPARTMENT_OTHER): Payer: Medicaid Other

## 2015-09-13 VITALS — BP 130/89 | HR 72 | Temp 98.0°F | Resp 20 | Ht 62.0 in | Wt 237.3 lb

## 2015-09-13 DIAGNOSIS — Z315 Encounter for genetic counseling: Secondary | ICD-10-CM

## 2015-09-13 DIAGNOSIS — C50212 Malignant neoplasm of upper-inner quadrant of left female breast: Secondary | ICD-10-CM

## 2015-09-13 DIAGNOSIS — Z170 Estrogen receptor positive status [ER+]: Secondary | ICD-10-CM | POA: Diagnosis not present

## 2015-09-13 DIAGNOSIS — F418 Other specified anxiety disorders: Secondary | ICD-10-CM | POA: Diagnosis not present

## 2015-09-13 DIAGNOSIS — Z808 Family history of malignant neoplasm of other organs or systems: Secondary | ICD-10-CM

## 2015-09-13 DIAGNOSIS — E669 Obesity, unspecified: Secondary | ICD-10-CM | POA: Diagnosis not present

## 2015-09-13 DIAGNOSIS — Z801 Family history of malignant neoplasm of trachea, bronchus and lung: Secondary | ICD-10-CM

## 2015-09-13 DIAGNOSIS — R293 Abnormal posture: Secondary | ICD-10-CM | POA: Diagnosis present

## 2015-09-13 DIAGNOSIS — J45909 Unspecified asthma, uncomplicated: Secondary | ICD-10-CM

## 2015-09-13 DIAGNOSIS — C50912 Malignant neoplasm of unspecified site of left female breast: Secondary | ICD-10-CM

## 2015-09-13 DIAGNOSIS — Z23 Encounter for immunization: Secondary | ICD-10-CM | POA: Diagnosis not present

## 2015-09-13 DIAGNOSIS — Z720 Tobacco use: Secondary | ICD-10-CM | POA: Diagnosis not present

## 2015-09-13 LAB — CBC WITH DIFFERENTIAL/PLATELET
BASO%: 0.5 % (ref 0.0–2.0)
BASOS ABS: 0.1 10*3/uL (ref 0.0–0.1)
EOS%: 4.2 % (ref 0.0–7.0)
Eosinophils Absolute: 0.5 10*3/uL (ref 0.0–0.5)
HEMATOCRIT: 38.9 % (ref 34.8–46.6)
HEMOGLOBIN: 13.2 g/dL (ref 11.6–15.9)
LYMPH#: 2.4 10*3/uL (ref 0.9–3.3)
LYMPH%: 21.7 % (ref 14.0–49.7)
MCH: 28.3 pg (ref 25.1–34.0)
MCHC: 33.9 g/dL (ref 31.5–36.0)
MCV: 83.3 fL (ref 79.5–101.0)
MONO#: 0.8 10*3/uL (ref 0.1–0.9)
MONO%: 7.5 % (ref 0.0–14.0)
NEUT#: 7.3 10*3/uL — ABNORMAL HIGH (ref 1.5–6.5)
NEUT%: 66.1 % (ref 38.4–76.8)
Platelets: 232 10*3/uL (ref 145–400)
RBC: 4.67 10*6/uL (ref 3.70–5.45)
RDW: 12.8 % (ref 11.2–14.5)
WBC: 11.1 10*3/uL — ABNORMAL HIGH (ref 3.9–10.3)

## 2015-09-13 LAB — COMPREHENSIVE METABOLIC PANEL (CC13)
ALBUMIN: 3.7 g/dL (ref 3.5–5.0)
ALT: 10 U/L (ref 0–55)
AST: 10 U/L (ref 5–34)
Alkaline Phosphatase: 101 U/L (ref 40–150)
Anion Gap: 8 mEq/L (ref 3–11)
BILIRUBIN TOTAL: 0.64 mg/dL (ref 0.20–1.20)
BUN: 16.8 mg/dL (ref 7.0–26.0)
CO2: 24 meq/L (ref 22–29)
Calcium: 9.5 mg/dL (ref 8.4–10.4)
Chloride: 107 mEq/L (ref 98–109)
Creatinine: 1.3 mg/dL — ABNORMAL HIGH (ref 0.6–1.1)
EGFR: 50 mL/min/{1.73_m2} — AB (ref >90)
GLUCOSE: 85 mg/dL (ref 70–140)
POTASSIUM: 4.4 meq/L (ref 3.5–5.1)
SODIUM: 139 meq/L (ref 136–145)
TOTAL PROTEIN: 7.3 g/dL (ref 6.4–8.3)

## 2015-09-13 MED ORDER — INFLUENZA VAC SPLIT QUAD 0.5 ML IM SUSY
0.5000 mL | PREFILLED_SYRINGE | Freq: Once | INTRAMUSCULAR | Status: AC
  Administered 2015-09-13: 0.5 mL via INTRAMUSCULAR
  Filled 2015-09-13: qty 0.5

## 2015-09-13 NOTE — Progress Notes (Signed)
Subjective:     Patient ID: Yolanda White, female   DOB: 1971-07-23, 44 y.o.   MRN: [id number]  HPI   Review of Systems     Objective:   Physical Exam For the patient to understand and be given the tools to implement a healthy plant based diet during their cancer diagnosis.     Assessment:     Patient was seen today and found to be jovial and accompanied by her 2 sisters. Pts ht 5'2'', 237 pounds, and BMI 43.5. Pts labs: GFR 50. Pt and her family joked the entire time with the pt not very interested in the information. Pt seems to not be ready for the change but her daughters state they will keep her on track. Pt states she has not had a bowel movement for 2 weeks. Pt states she is not physically active and cannot use the stairs because she cannot catch her breath.      Plan:     Dietitian educated the patient on implementing a plant based diet by incorporating more plant proteins, fruits, and vegetables. As a part of a healthy routine physical activity was discussed. Dietitian stressed legitimate evidence based resources due to the comments they made about nutrition.  The importance of legitimate, evidence based information was discussed and examples were given. Dietitian offered diet related strategies to have a proper bowel movement.  A folder of evidence based information with a focus on a plant based diet and general nutrition during cancer was given to the patient.  As a part of the continuum of care the cancer dietitian's contact information was given to the patient in the event they would like to have a follow up appointment.

## 2015-09-13 NOTE — Progress Notes (Signed)
Flu shot given by desk nurse.
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
## 2015-09-13 NOTE — Progress Notes (Signed)
[facility]  [phone] [phone]  Clinic New Consult Note   Patient Care Team: Provider Default, MD as PCP - General Benjamin Hoxworth, MD as Consulting Physician (General Surgery) Yliana Gravois, MD as Consulting Physician (Hematology) Stacy Wentworth, MD as Consulting Physician (Radiation Oncology) 09/13/2015  CHIEF COMPLAINTS/PURPOSE OF CONSULTATION:  Newly diagnosed left breast cancer.  Oncology History   Breast cancer of upper-inner quadrant of left female breast (HCC)   Staging form: Breast, AJCC 7th Edition     Clinical: Stage IA (T1c, N0, M0) - Signed by Nehemiah Mcfarren, MD on 09/13/2015     Pathologic: No stage assigned - Unsigned       Breast cancer of upper-inner quadrant of left female breast (HCC)   08/29/2015 Mammogram diagnostic mammo and US showed a 1.2X0.9X0.6cm mass in left breast 11:00 position    09/01/2015 Receptors her2 ER 90%+, PR 90%+, HER2-, Ki67 25%   09/01/2015 Initial Biopsy left breast biopsy showed invasive ductal carcinoma, G1   09/01/2015 Initial Diagnosis Breast cancer of upper-inner quadrant of left female breast (HCC)   HISTORY OF PRESENTING ILLNESS:  Cimberly M Hanning 44 y.o. female is here because of her newly diagnosed left breast cancer. She is accompanied by her 2 daughters and one sister to our multidisciplinary breast clinic today.  She has not seen a primary care physician for many years. She presented with intermittent left breast pain for one month, and she was seen in the emergency room on 08/08/2015. She was referred to breast imaging center and had a mammogram done on 08/29/2015. Which showed a 1.2 cm mass in the left breast 11:00 position. She underwent core needle biopsy on 09/01/2015, which showed invasive ductal carcinoma, ER/PR stripe positive, HER-2 negative.  She has had lot of social and financial stress in the past year. She lives in a hotel now, her daughters live with her sister. She reports  right-sided low back pain for the past few months, positional, she denies injury prior to that. She is also quite depressed and very anxious since the cancer diagnosis, she denies any significant dyspnea, GI symptoms or other new symptoms.  MEDICAL HISTORY:  Past Medical History  Diagnosis Date  . Asthma   . Seasonal allergies   . 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  . Breast cancer (HCC)   . Anxiety   . Depression     SURGICAL HISTORY: Past Surgical History  Procedure Laterality Date  . Abdominal hysterectomy     GYN HISTORY  Menarchal: 12 LMP: Hysterectomy in 2010  Contraceptive: she was on for one year , stopped 23 years ago  HRT: n/a G4  P3: she was 18 with her first child, (+) breast feeding    SOCIAL HISTORY: Social History   Social History  . Marital Status: Single    Spouse Name: N/A  . Number of Children: 3 daughters age 21-25  . Years of Education: N/A   Occupational History  . Not on file.   Social History Main Topics  . Smoking status: Current Every Day Smoker -- 0.30 packs/day    Types: Cigarettes  . Smokeless tobacco: Not on file  . Alcohol Use: No  . Drug Use: No  . Sexual Activity: Yes    Birth Control/ Protection: Surgical   Other Topics Concern  . Not on file   Social History Narrative    FAMILY HISTORY: Family History  Problem Relation Age of Onset  . Diabetes   Mother   . Diabetes Father   . Cancer Father     lung  . Cancer Paternal Aunt     cervical    ALLERGIES:  has No Known Allergies.  MEDICATIONS:  Current Outpatient Prescriptions  Medication Sig Dispense Refill  . albuterol (PROVENTIL) (2.5 MG/3ML) 0.083% nebulizer solution Take 2.5 mg by nebulization every 6 (six) hours as needed for wheezing or shortness of breath.    . cetirizine-pseudoephedrine (ZYRTEC-D) 5-120 MG per tablet Take 1 tablet by mouth 2 (two) times daily. (Patient not taking: Reported on 08/08/2015) 15 tablet 0  .  cyclobenzaprine (FLEXERIL) 10 MG tablet Take 1 tablet (10 mg total) by mouth 2 (two) times daily as needed for muscle spasms. (Patient not taking: Reported on 10/06/2014) 15 tablet 0  . HYDROcodone-acetaminophen (NORCO/VICODIN) 5-325 MG per tablet Take 1-2 tablets by mouth every 4 (four) hours as needed. (Patient not taking: Reported on 10/16/2014) 10 tablet 0  . ibuprofen (ADVIL,MOTRIN) 800 MG tablet Take 1 tablet (800 mg total) by mouth 3 (three) times daily. (Patient not taking: Reported on 08/08/2015) 21 tablet 0  . naproxen (NAPROSYN) 500 MG tablet Take 1 tablet (500 mg total) by mouth 2 (two) times daily. (Patient not taking: Reported on 08/25/2015) 30 tablet 0  . ondansetron (ZOFRAN) 4 MG tablet Take 1 tablet (4 mg total) by mouth every 6 (six) hours. (Patient not taking: Reported on 10/16/2014) 12 tablet 0  . oxyCODONE-acetaminophen (PERCOCET/ROXICET) 5-325 MG tablet Take 1-2 tablets by mouth every 6 (six) hours as needed for severe pain. (Patient not taking: Reported on 08/25/2015) 15 tablet 0  . predniSONE (DELTASONE) 20 MG tablet Take 3 tablets (60 mg total) by mouth daily. (Patient not taking: Reported on 08/08/2015) 9 tablet 0   No current facility-administered medications for this visit.    REVIEW OF SYSTEMS:   Constitutional: Denies fevers, chills or abnormal night sweats Eyes: Denies blurriness of vision, double vision or watery eyes Ears, nose, mouth, throat, and face: Denies mucositis or sore throat Respiratory: Denies cough, dyspnea or wheezes Cardiovascular: Denies palpitation, chest discomfort or lower extremity swelling Gastrointestinal:  Denies nausea, heartburn or change in bowel habits Skin: Denies abnormal skin rashes Lymphatics: Denies new lymphadenopathy or easy bruising Neurological:Denies numbness, tingling or new weaknesses Behavioral/Psych: Mood is stable, no new changes  All other systems were reviewed with the patient and are negative.  PHYSICAL  EXAMINATION: ECOG PERFORMANCE STATUS: 1 - Symptomatic but completely ambulatory  Filed Vitals:   09/13/15 0820  BP: 130/89  Pulse: 72  Temp: 98 F (36.7 C)  Resp: 20   Filed Weights   09/13/15 0820  Weight: 237 lb 4.8 oz (107.639 kg)    GENERAL:alert, no distress and comfortable SKIN: skin color, texture, turgor are normal, no rashes or significant lesions EYES: normal, conjunctiva are pink and non-injected, sclera clear OROPHARYNX:no exudate, no erythema and lips, buccal mucosa, and tongue normal  NECK: supple, thyroid normal size, non-tender, without nodularity LYMPH:  no palpable lymphadenopathy in the cervical, axillary or inguinal LUNGS: clear to auscultation and percussion with normal breathing effort HEART: regular rate & rhythm and no murmurs and no lower extremity edema ABDOMEN:abdomen soft, non-tender and normal bowel sounds Musculoskeletal:no cyanosis of digits and no clubbing  PSYCH: alert & oriented x 3 with fluent speech NEURO: no focal motor/sensory deficits Breasts: Breast inspection showed them to be symmetrical with no nipple discharge. Palpation of the breasts and axilla revealed no obvious mass that I could appreciate. (+) Tenderness  in the upper inner quadrant of the left breast.   LABORATORY DATA:  I have reviewed the data as listed Lab Results  Component Value Date   WBC 11.1* 09/13/2015   HGB 13.2 09/13/2015   HCT 38.9 09/13/2015   MCV 83.3 09/13/2015   PLT 232 09/13/2015    Recent Labs  10/06/14 1233 08/08/15 0304 09/13/15 0752  NA 138 137 139  K 4.2 3.7 4.4  CL 101 104  --   CO2 _0 GLUCOSE 110* 105* 85  BUN 8 9 16.8  CREATININE 1.05 1.05* 1.3*  CALCIUM 8.9 9.2 9.5  GFRNONAA 65* >60  --   GFRAA 75* >60  --   PROT 6.8  --  7.3  ALBUMIN 3.3*  --  3.7  AST 14  --  10  ALT 12  --  10  ALKPHOS 93  --  101  BILITOT 0.6  --  0.64   PATHOLOGY REPORT: Diagnosis 09/01/2015  Breast, left, needle core biopsy, 11:00 o'clock -  INVASIVE DUCTAL CARCINOMA. - SEE COMMENT. Microscopic Comment The carcinoma appears grade 1. A breast prognostic profile will be performed and the results reported separately. The results were called to the [facility] on 09/04/2015. (JBK:kh 09/04/2015) Results: IMMUNOHISTOCHEMICAL AND MORPHOMETRIC ANALYSIS PERFORMED MANUALLY Estrogen Receptor: 90%, POSITIVE, STRONG STAINING INTENSITY Progesterone Receptor: 90%, POSITIVE, STRONG STAINING INTENSITY Proliferation Marker Ki67: 25% Results: HER2 - NEGATIVE RATIO OF HER2/CEP17 SIGNALS 1.27 AVERAGE HER2 COPY NUMBER PER CELL 2.10   RADIOGRAPHIC STUDIES: I have personally reviewed the radiological images as listed and agreed with the findings in the report.  Mm Diag Breast Tomo Bilateral and [place]   08/29/2015  ADDENDUM REPORT: 08/29/2015 08:40 ADDENDUM: The left axilla was not evaluated during this exam. Left axillary ultrasound may be performed at the time of biopsy. Electronically Signed   By: Franki Cabot M.D.   On: 08/29/2015 08:40  08/29/2015  CLINICAL DATA:  Left breast lump EXAM: DIGITAL DIAGNOSTIC BILATERAL MAMMOGRAM WITH 3D TOMOSYNTHESIS WITH CAD ULTRASOUND LEFT BREAST COMPARISON:  Previous exam(s). ACR Breast Density Category b: There are scattered areas of fibroglandular density. FINDINGS: Bilateral CC and MLO views were obtained with 3D tomosynthesis. Additional spot compression view, with tomosynthesis, performed at the site of the palpable abnormality in the upper left breast which is demarcated with an overlying skin marker. There is architectural distortion with upper inner quadrant of the left breast, 11 o'clock axis region, corresponding to the site of palpable abnormality. There are no dominant masses, suspicious calcifications or secondary signs of malignancy within the right breast. Mammographic images were processed with CAD. On physical exam, skin retraction noted within the upper left breast. Targeted ultrasound is  performed, showing an irregular shadowing mass within the left breast at the 11 o'clock axis, 7 cm from the nipple, measuring 1.2 x 0.9 x 0.6 cm, corresponding to the site of palpable abnormality and mammographic finding. IMPRESSION: 1. Irregular shadowing mass within the left breast at the 11 o'clock axis, 7 cm from the nipple, measuring 1.2 x 0.9 x 0.6 cm, corresponding to the site of palpable abnormality and skin retraction, also corresponding to the area of architectural distortion on mammogram. This is a highly suspicious finding for which ultrasound-guided biopsy is recommended. 2.  No evidence of malignancy within the right breast. RECOMMENDATION: Ultrasound-guided biopsy for the irregular shadowing mass in the left breast at the 11 o'clock axis, 7 cm from the nipple, measuring 1.2 x 0.9 x 0.6 cm.  Postprocedure mammogram to ensure mammographic and sonographic correlation. Ultrasound-guided biopsy is scheduled for November 4th at 8 a.m. I have discussed the findings and recommendations with the patient. Results were also provided in writing at the conclusion of the visit. If applicable, a reminder letter will be sent to the patient regarding the next appointment. BI-RADS CATEGORY  5: Highly suggestive of malignancy. Electronically Signed: By: Stan  Maynard M.D. On: 08/25/2015 13:59     ASSESSMENT & PLAN: 44-year-old female with past medical history of asthma, obesity, anxiety and depression, presented with left breast pain.  1. Left breast invasive ductal carcinoma, cT1cN0M0, stage IA, ER/PR strongly positive, HER-2 negative --We discussed her imaging findings and the biopsy results in great details. -Giving the relatively small size of the tumor, she is likely a candidate for breast lumpectomy. She was seen by Dr. Hoxworth today.  -I recommend a Oncotype Dx test on the surgical sample and we'll make a decision about adjuvant chemotherapy based on the Oncotype result. Written material of this test was  given to her. She is young, with good performance status, would be a candidate for chemotherapy if her Oncotype recurrence score is high. -Giving the strong ER and PR expression in her tumor and premenopausal status, I recommend adjuvant endocrine therapy with tamoxifen for a total of 10 years to reduce the risk of cancer recurrence. -She was also seen by radiation oncologist Dr. Wentworth today. She would likely have adjuvant irradiation.   2. Genetics -She is a very young, we'll call 5 for genetic testing to ruled out breast and ovarian cancer syndrome. She is scheduled to see a genetic counselor today. -I strongly encouraged her daughters to start screening him grams or breast MI in the early 30s.   3. Asthma, anxiety and obesity  -She does not have a primary care physician, I strongly encouraged her to establish her care with her primary care physician.  4. Social issues -She is applying for Medicaid, we'll refer her to our social worker.  Plan -I'll see her 3 weeks after her surgery, to discuss her Oncotype test results.  All questions were answered. The patient knows to call the clinic with any problems, questions or concerns. I spent 55 minutes counseling the patient face to face. The total time spent in the appointment was 60 minutes and more than 50% was on counseling.     Feng, Yan, MD 09/13/2015 11:24 AM     

## 2015-09-13 NOTE — Progress Notes (Signed)
REFERRING PROVIDER: Truitt Merle, MD  PRIMARY PROVIDER:  Default, Provider, MD  PRIMARY REASON FOR VISIT:  1. Breast cancer of upper-inner quadrant of left female breast ([place])      HISTORY OF PRESENT ILLNESS:   Yolanda White, a 44 y.o. female, was seen for a [place] cancer genetics consultation at the request of Dr. Burr Medico due to a personal history of cancer.  Yolanda White presents to clinic today to discuss the possibility of a hereditary predisposition to cancer, genetic testing, and to further clarify her future cancer risks, as well as potential cancer risks for family members.   In 2016, at the age of 40, Yolanda White was diagnosed with invasive ductal carcinoma of the left breast. Depending on her genetic testing results, this will be treated with surgery, chemotherapy and radiation.    CANCER HISTORY:  Oncology History   Breast cancer of upper-inner quadrant of left female breast [facility])   Staging form: Breast, AJCC 7th Edition     Clinical: Stage IA (T1c, N0, M0) - Signed by Truitt Merle, MD on 09/13/2015     Pathologic: No stage assigned - Unsigned       Breast cancer of upper-inner quadrant of left female breast ([place])   08/29/2015 Mammogram diagnostic mammo and US showed a 1.2X0.9X0.6cm mass in left breast 11:00 position    09/01/2015 Receptors her2 ER 90%+, PR 90%+, HER2-, Ki67 25%   09/01/2015 Initial Biopsy left breast biopsy showed invasive ductal carcinoma, G1   09/01/2015 Initial Diagnosis Breast cancer of upper-inner quadrant of left female breast ([place])     HORMONAL RISK FACTORS:  Menarche was at age 42.  First live birth at age 35.  OCP use for approximately 1 years.  Ovaries intact: yes.  Hysterectomy: yes.  Menopausal status: perimenopausal.  HRT use: 0 years. Colonoscopy: no; not examined. Mammogram within the last year: yes. Number of breast biopsies: 1. Up to date with pelvic exams:  no. Any excessive radiation exposure in the past:  no  Past Medical History   Diagnosis Date  . Asthma   . Seasonal allergies   . Breast cancer of upper-inner quadrant of left female breast ([place]) 09/08/2015  . Breast cancer ([place])   . Anxiety   . Depression     Past Surgical History  Procedure Laterality Date  . Abdominal hysterectomy      Social History   Social History  . Marital Status: Single    Spouse Name: N/A  . Number of Children: 3  . Years of Education: N/A   Social History Main Topics  . Smoking status: Current Every Day Smoker -- 0.30 packs/day    Types: Cigarettes  . Smokeless tobacco: None  . Alcohol Use: No  . Drug Use: No  . Sexual Activity: Yes    Birth Control/ Protection: Surgical   Other Topics Concern  . None   Social History Narrative     FAMILY HISTORY:  We obtained a detailed, 4-generation family history.  Significant diagnoses are listed below: Family History  Problem Relation Age of Onset  . Diabetes Mother   . Diabetes Father   . Lung cancer Father 57    smoker  . Cancer Paternal Aunt     cervical  . Diabetes Brother   . Heart attack Paternal Grandmother     in her 25s  . Diabetes Brother     maternal half brother   The patient has 3 daughters, two full brothers, one full sister and  a maternal half brother.  The youngest full brother was killed at age 64.  Her mother is alive at 32.  She has 18 brothers and sisters, four are full siblings.  Nobody had cancer as far as the patient knows.  Her father died of lung cancer in 2015-01-24 at age 73.  He had one full brother and two full sisters, and several paternal half siblings.  One full sister died of unknown reasons, and the other full sister had cervical cancer.  There is no other reported family history of cancer.  Patient's maternal ancestors are of [place] descent, and paternal ancestors are of [place] and [place] descent. There is no reported Ashkenazi Jewish ancestry. There is no known consanguinity.  GENETIC COUNSELING ASSESSMENT: Yolanda White is a 44 y.o. female with a personal history of breast cancer which somewhat suggestive of a hereditayr cancer syndrome and predisposition to cancer. We, therefore, discussed and recommended the following at today's visit.   DISCUSSION: We discussed that about 5-10% of breast cancer is hereditary, with most being the result of BRCA1 or BRCA2 mutations.  We also see other mutations most commonly in PALB2, ATM and CHEK2.  We also discussed genetic testing, including the appropriate family members to test, the process of testing, insurance coverage and turn-around-time for results. We discussed the implications of a negative, positive and/or variant of uncertain significant result. We recommended Yolanda White pursue genetic testing for the [facility] gene panel. The [facility] gene panel offered by [employer] includes sequencing and deletion/duplication testing of the following 27 genes: APC, ATM, BARD1, BMPR1A, BRCA1, BRCA2, BRIP1, CHD1, CDK4, CDKN2A, CHEK2, EPCAM (large rearrangement only), MLH1, MSH2, MSH6, MUTYH, NBN, PALB2, PMS2, PTEN, RAD51C, RAD51D, SMAD4, STK11, and TP53. Sequencing was performed for select regions of POLE and POLD1, and large rearrangement analysis was performed for select regions of GREM1.   Based on Yolanda White's personal history of cancer, she meets medical criteria for genetic testing. Yolanda White currently does not have health insurance, and is awaiting Medicaid approval.  Therefore we are applying for the [place] (MAP) for genetic testing coverage.  We reviewed the characteristics, features and inheritance patterns of hereditary cancer syndromes. If she is not approved for the MAP program, she will be called with her OOP cost.  If she is approved, then her testing will commence immediately after approval.  PLAN: After considering the risks, benefits, and limitations, Yolanda White  provided informed consent to pursue genetic testing and the blood sample was  sent to [facility] for analysis of the [facility]. Results should be available within approximately 2-3 weeks' time, at which point they will be disclosed by telephone to Yolanda White, as will any additional recommendations warranted by these results. Yolanda White will receive a summary of her genetic counseling visit and a copy of her results once available. This information will also be available in Epic. We encouraged Yolanda White to remain in contact with cancer genetics annually so that we can continuously update the family history and inform her of any changes in cancer genetics and testing that may be of benefit for her family. Yolanda White questions were answered to her satisfaction today. Our contact information was provided should additional questions or concerns arise.  Lastly, we encouraged Yolanda White to remain in contact with cancer genetics annually so that we can continuously update the family history and inform her of any changes in cancer genetics and testing that may be  of benefit for this family.   Ms.  White questions were answered to her satisfaction today. Our contact information was provided should additional questions or concerns arise. Thank you for the referral and allowing [place] to share in the care of your patient.   Karen P. Florene Glen, [place], [facility] Certified Genetic Counselor [email or web address] phone: [phone]  The patient was seen for a total of 60 minutes in face-to-face genetic counseling.  This patient was discussed with Drs. Magrinat, Lindi Adie and/or Burr Medico who agrees with the above.    _______________________________________________________________________ For Office Staff:  Number of people involved in session: 2 Was an Intern/ student involved with case: no

## 2015-09-13 NOTE — Progress Notes (Signed)
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  HISTORY OF PRESENT ILLNESS::Yolanda White Vary is a 43 y.o. female with a palpable left breast mass to the [facility] clinic and this was painful and associated with some nipple discharge. Mammogram confirmed a mass in the upper outer quadrant of left breast. Ultrasound showed this to measure 1.2 x 0.9 x 0.6 cm. Biopsy was performed which showed invasive ductal carcinoma which was ER positive, PR positive, and HER2 negative. KI67 is 25%. This is a grade 1 tumor. The left axilla showed no abnormal appearing lymph nodes. She is accompanied by her daughter and two sisters. She is very anxious after having several traumatic incidents this year. She is sore after her biopsy.  PREVIOUS RADIATION THERAPY: No  Past medical, social and family history were reviewed in the electronic chart. Review of symptoms was reviewed in the electronic chart. Medications were reviewed in the electronic chart.   Gynecologic History  Age at first menstrual period? 12  Are you still having periods? No Approximate date of last period? 2010  If you are still having periods: Are your periods regular? No  If you no longer have periods: Have you used hormone replacement? No  Obstetric History:  How many children have you carried to term? 3 Your age at first live birth? 54  Pregnant now or trying to get pregnant? No  Have you used birth control pills or hormone shots for contraception? No Health Maintenance:  Have you ever had a colonoscopy? No  Have you ever had a bone density? No  PHYSICAL EXAM: There were no vitals filed for this visit.Marland Kitchen    09/13/15  BP 130/89 mmHg  Pulse Rate 72  Resp 20  Temp 98 F (36.7  C)  Temp Source Oral  SpO2 100 %  Weight 237 lb 4.8 oz (107.639 kg)  Height '5\' 2"'  (1.575 m)       Pleasant female in moderate distress. Biopsy site in upper-inner quadrant of left breast with palpable thickening below. No palpable abdnormalities in rest of breast or right breast. No palpable axiliary, cervical, or supraclavicular adenopathy.  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  PLAN: I spoke to the patient today regarding her diagnosis and options for treatment. We discussed the equivalence in terms of survival and local failure between mastectomy and breast conservation. We discussed the role of radiation in decreasing local failures in patients who undergo lumpectomy. We discussed the process of simulation and the placement tattoos. We discussed 4-6 weeks of treatment as an outpatient. We discussed the possibility of asymptomatic lung damage. We discussed the low likelihood of secondary malignancies. We discussed the possible side effects including but not limited to skin redness, fatigue, permanent skin darkening, and breast swelling.  We discussed the use of cardiac sparing with deep inspiration breath hold if needed.  We discussed that she may have oncotype testing if her tumor was over 5 mm which she has discussed with Dr. Burr Medico.  She met with surgery, medical oncology, social work, physical therapy and our dietician.   She will be referred for genetic counseling, as well.   ------------------------------------------------  Thea Silversmith, MD  This document serves as a record of  services personally performed by Thea Silversmith, MD. It was created on her behalf by  Lendon Collar, a trained medical scribe. The creation of this record is based on the scribe's personal observations and the provider's statements to them. This document has been checked and approved by the attending provider.

## 2015-09-13 NOTE — Addendum Note (Signed)
Addended by: Rockwell Germany on: 09/13/2015 12:34 PM   Modules accepted: Orders

## 2015-09-13 NOTE — Therapy (Signed)
[place], [place], [address] Phone: [phone]   Fax:  [phone]  Physical Therapy Evaluation  Patient Details  Name: Yolanda White MRN: [id number] Date of Birth: 07-09-1971 Referring Provider: Dr. Excell Seltzer  Encounter Date: 09/13/2015      PT End of Session - 09/13/15 1149    Visit Number 1   Number of Visits 1   PT Start Time 1020   PT Stop Time 0370  Also saw pt from 1100-1115   PT Time Calculation (min) 16 min   Activity Tolerance Patient tolerated treatment well   Behavior During Therapy [facility] for tasks assessed/performed      Past Medical History  Diagnosis Date  . Asthma   . Seasonal allergies   . Breast cancer of upper-inner quadrant of left female breast ([place]) 09/08/2015  . Breast cancer ([place])   . Anxiety   . Depression     Past Surgical History  Procedure Laterality Date  . Abdominal hysterectomy      There were no vitals filed for this visit.  Visit Diagnosis:  Carcinoma of left breast upper inner quadrant ([place]) - Plan: PT plan of care cert/re-cert  Abnormal posture - Plan: PT plan of care cert/re-cert      Subjective Assessment - 09/13/15 1046    Subjective Patient was seen today for a baseline assessment of her newly diagnosed left breast cancer.   Patient is accompained by: Family member   Pertinent History Patient was diagnosed 08/25/15 with left invasive ductal carcinoma breast cancer in the upper inner quadrant.  It is ER/PR positive and HER2 negative with a Ki67 of 25% and her mass measures 1.2 cm.   Patient Stated Goals Reduce lymphedema risk and learn post op shoulder ROM HEP   Currently in Pain? Yes   Pain Score 9    Pain Location Back   Pain Orientation Mid;Lower   Pain Descriptors / Indicators Constant;Sharp   Pain Type Chronic pain   Pain Onset More than a month ago   Pain Frequency Constant   Aggravating Factors  sitting   Pain Relieving  Factors moving   Multiple Pain Sites Yes   Pain Score 9   Pain Location Foot   Pain Orientation Right;Left   Pain Descriptors / Indicators Burning   Pain Type Neuropathic pain   Pain Onset More than a month ago   Pain Frequency Intermittent   Aggravating Factors  walking   Pain Relieving Factors not walking             [facility] PT Assessment - 09/13/15 0001    Assessment   Medical Diagnosis Left breast cancer   Referring Provider Dr. Excell Seltzer   Onset Date/Surgical Date 08/25/15   Hand Dominance Right   Prior Therapy none   Precautions   Precautions Other (comment);Fall  Active breast cancer   Restrictions   Weight Bearing Restrictions No   Balance Screen   Has the patient fallen in the past 6 months Yes   How many times? 3   Has the patient had a decrease in activity level because of a fear of falling?  No   Is the patient reluctant to leave their home because of a fear of falling?  No  Falls due to burning feet; declined PT for now   [place] Other (Comment)   Additional Comments --  Lives in hotel and sometimes with 14 y.o. daughter  Prior Function   Level of Independence Independent   Vocation Part time employment   Systems developer; currently doing warehouse work   Leisure She does not exercise   Cognition   Overall Cognitive Status Within Functional Limits for tasks assessed   Posture/Postural Control   Posture/Postural Control Postural limitations   Postural Limitations Rounded Shoulders;Forward head   ROM / Strength   AROM / PROM / Strength AROM;Strength   AROM   AROM Assessment Site Shoulder   Right/Left Shoulder Right;Left   Right Shoulder Extension 63 Degrees   Right Shoulder Flexion 164 Degrees   Right Shoulder ABduction 161 Degrees   Right Shoulder Internal Rotation 47 Degrees   Right Shoulder External Rotation 83 Degrees   Left Shoulder Extension 70 Degrees   Left Shoulder Flexion 163 Degrees    Left Shoulder ABduction 164 Degrees   Left Shoulder Internal Rotation 57 Degrees   Left Shoulder External Rotation 74 Degrees   Strength   Overall Strength Within functional limits for tasks performed           LYMPHEDEMA/ONCOLOGY QUESTIONNAIRE - 09/13/15 1147    Type   Cancer Type Left breast cancer   Lymphedema Assessments   Lymphedema Assessments Upper extremities   Right Upper Extremity Lymphedema   10 cm Proximal to Olecranon Process 36 cm   Olecranon Process 27.4 cm   10 cm Proximal to Ulnar Styloid Process 25.2 cm   Just Proximal to Ulnar Styloid Process 16.8 cm   Across Hand at [employer] 19.5 cm   At [place] of 2nd Digit 6.4 cm   Left Upper Extremity Lymphedema   10 cm Proximal to Olecranon Process 35.3 cm   Olecranon Process 26.2 cm   10 cm Proximal to Ulnar Styloid Process 24.5 cm   Just Proximal to Ulnar Styloid Process 17.1 cm   Across Hand at [employer] 19.8 cm   At [place] of 2nd Digit 6.3 cm      Patient was instructed today in a home exercise program today for post op shoulder range of motion. These included active assist shoulder flexion in sitting, scapular retraction, wall walking with shoulder abduction, and hands behind head external rotation.  She was encouraged to do these twice a day, holding 3 seconds and repeating 5 times when permitted by her physician.           PT Education - 09/13/15 1148    Education provided Yes   Education Details Lymphedema risk reduction and post op shoulder ROM HEP   Person(s) Educated Patient   Methods Explanation;Demonstration;Handout   Comprehension Returned demonstration;Verbalized understanding              Breast Clinic Goals - 09/13/15 1249    Patient will be able to verbalize understanding of pertinent lymphedema risk reduction practices relevant to her diagnosis specifically related to skin care.   Baseline No knowledge   Time 1   Period Days   Status Achieved   Patient will be able to  return demonstrate and/or verbalize understanding of the post-op home exercise program related to regaining shoulder range of motion.   Baseline No knowledge   Time 1   Period Days   Status Achieved   Patient will be able to verbalize understanding of the importance of attending the postoperative After Breast Cancer Class for further lymphedema risk reduction education and therapeutic exercise.   Baseline No knowledge   Time 1   Period Days   Status  Achieved              Plan - 09/13/15 1150    Clinical Impression Statement Patient was diagnosed 08/25/15 with left invasive ductal carcinoma breast cancer in the upper inner quadrant.  It is ER/PR positive and HER2 negative with a Ki67 of 25% and her mass measures 1.2 cm.  She is planning to have a left lumpectomy and sentinel node biopsy followed by radiation and anti-estrogen therapy.  She will have Oncotype testing.  She may benefit from post op PT to regain shoulder ROm and strength and reduce her risk of lymphedema which is higher than normal due to her BMI.   Pt will benefit from skilled therapeutic intervention in order to improve on the following deficits Decreased strength;Decreased knowledge of precautions;Pain;Impaired UE functional use;Decreased range of motion   Rehab Potential Excellent   Clinical Impairments Affecting Rehab Potential social situation; limited visits with Medicaid   PT Frequency One time visit   PT Treatment/Interventions Therapeutic exercise;Patient/family education   Consulted and Agree with Plan of Care Patient;Family member/caregiver   Family Member Consulted daughters and sister       Patient will follow up at outpatient cancer rehab if needed following surgery.  If the patient requires physical therapy at that time, a specific plan will be dictated and sent to the referring physician for approval. The patient was educated today on appropriate basic range of motion exercises to begin post operatively  and the importance of attending the After Breast Cancer class following surgery.  Patient was educated today on lymphedema risk reduction practices as it pertains to recommendations that will benefit the patient immediately following surgery.  She verbalized good understanding.  No additional physical therapy is indicated at this time.      Problem List Patient Active Problem List   Diagnosis Date Noted  . Breast cancer of upper-inner quadrant of left female breast ([place]) 09/08/2015    Annia Friendly, PT 09/13/2015 12:53 PM  [place], [place], [address] Phone: [phone]   Fax:  [phone]  Name: Yolanda White MRN: [id number] Date of Birth: September 07, 1971

## 2015-09-13 NOTE — Patient Instructions (Signed)

## 2015-09-14 ENCOUNTER — Inpatient Hospital Stay: Admission: RE | Admit: 2015-09-14 | Payer: Self-pay | Source: Ambulatory Visit

## 2015-09-14 ENCOUNTER — Other Ambulatory Visit: Payer: Self-pay | Admitting: General Surgery

## 2015-09-14 DIAGNOSIS — C50912 Malignant neoplasm of unspecified site of left female breast: Secondary | ICD-10-CM

## 2015-09-15 ENCOUNTER — Other Ambulatory Visit: Payer: Self-pay | Admitting: General Surgery

## 2015-09-15 ENCOUNTER — Telehealth: Payer: Self-pay | Admitting: *Deleted

## 2015-09-15 DIAGNOSIS — C50912 Malignant neoplasm of unspecified site of left female breast: Secondary | ICD-10-CM

## 2015-09-15 NOTE — Telephone Encounter (Signed)
Left vm for pt to return call regarding [facility] from 09/13/15.  

## 2015-09-18 ENCOUNTER — Encounter (HOSPITAL_COMMUNITY): Payer: Self-pay | Admitting: *Deleted

## 2015-09-18 ENCOUNTER — Emergency Department (HOSPITAL_COMMUNITY)
Admission: EM | Admit: 2015-09-18 | Discharge: 2015-09-19 | Disposition: A | Discharge status: Home / Self Care | Payer: Medicaid Other | Attending: Emergency Medicine | Admitting: Emergency Medicine

## 2015-09-18 ENCOUNTER — Inpatient Hospital Stay (HOSPITAL_COMMUNITY)
Admission: AD | Admit: 2015-09-18 | Discharge: 2015-09-18 | Disposition: A | Discharge status: Home / Self Care | Payer: Medicaid Other | Source: Ambulatory Visit | Attending: Family Medicine | Admitting: Family Medicine

## 2015-09-18 ENCOUNTER — Other Ambulatory Visit: Payer: Self-pay | Admitting: *Deleted

## 2015-09-18 DIAGNOSIS — C50912 Malignant neoplasm of unspecified site of left female breast: Secondary | ICD-10-CM | POA: Diagnosis not present

## 2015-09-18 DIAGNOSIS — C50212 Malignant neoplasm of upper-inner quadrant of left female breast: Secondary | ICD-10-CM | POA: Diagnosis not present

## 2015-09-18 DIAGNOSIS — Z8659 Personal history of other mental and behavioral disorders: Secondary | ICD-10-CM | POA: Diagnosis not present

## 2015-09-18 DIAGNOSIS — G893 Neoplasm related pain (acute) (chronic): Secondary | ICD-10-CM | POA: Insufficient documentation

## 2015-09-18 DIAGNOSIS — N644 Mastodynia: Secondary | ICD-10-CM

## 2015-09-18 DIAGNOSIS — Z79899 Other long term (current) drug therapy: Secondary | ICD-10-CM | POA: Diagnosis not present

## 2015-09-18 DIAGNOSIS — F17210 Nicotine dependence, cigarettes, uncomplicated: Secondary | ICD-10-CM | POA: Insufficient documentation

## 2015-09-18 DIAGNOSIS — J45909 Unspecified asthma, uncomplicated: Secondary | ICD-10-CM | POA: Diagnosis not present

## 2015-09-18 DIAGNOSIS — R079 Chest pain, unspecified: Secondary | ICD-10-CM | POA: Insufficient documentation

## 2015-09-18 DIAGNOSIS — C50219 Malignant neoplasm of upper-inner quadrant of unspecified female breast: Secondary | ICD-10-CM | POA: Diagnosis not present

## 2015-09-18 LAB — URINALYSIS, ROUTINE W REFLEX MICROSCOPIC
BILIRUBIN URINE: NEGATIVE
GLUCOSE, UA: NEGATIVE mg/dL
HGB URINE DIPSTICK: NEGATIVE
Ketones, ur: NEGATIVE mg/dL
Leukocytes, UA: NEGATIVE
Nitrite: NEGATIVE
PH: 5 (ref 5.0–8.0)
Protein, ur: NEGATIVE mg/dL
SPECIFIC GRAVITY, URINE: 1.02 (ref 1.005–1.030)

## 2015-09-18 MED ORDER — ONDANSETRON 8 MG PO TBDP
8.0000 mg | ORAL_TABLET | Freq: Once | ORAL | Status: AC
  Administered 2015-09-18: 8 mg via ORAL
  Filled 2015-09-18: qty 1

## 2015-09-18 MED ORDER — OXYCODONE-ACETAMINOPHEN 5-325 MG PO TABS
2.0000 | ORAL_TABLET | Freq: Once | ORAL | Status: AC
  Administered 2015-09-18: 2 via ORAL
  Filled 2015-09-18: qty 2

## 2015-09-18 NOTE — ED Notes (Signed)
PT states that she was recently diagnosed with breast CA; pt states that she has had left breast pain today and nausea; pt was seen at [facility] and was given pain and nausea medicine; pt states that she was advised to come here for further evaluation; pt states that she has pressure to her left breast and when the pain hits she has nausea; pt is scheduled for surgery on 12/14 for lumpectomy of left breast

## 2015-09-18 NOTE — ED Notes (Signed)
Pt was recently diagnosed for breast cancer. She initially had c/o pains that she compared to being engorged when she was breast feeding her children years ago. This pain made her seek treatment that lead to the CA diagnosis. Pt states that pain has returned. Pt has surgery scheduled in December for tumor removal

## 2015-09-18 NOTE — [facility] Note (Signed)
Pain in left breast , pt states she has breast cancer and is scheduled to have surgery on 12/14. States she has had pain off/on but today it is really bad and tylenol and advil are not helping. Pt also reports nausea from the pain.

## 2015-09-18 NOTE — Discharge Instructions (Signed)
Breast Cancer, Female °Breast cancer is an abnormal growth of tissue (tumor) in the breast that is cancerous (malignant). Unlike noncancerous (benign) tumors, malignant tumors can spread to other parts of your body. The most common type of female breast cancer begins in the milk ducts (ductal carcinoma). Breast cancer is one of the most common types of cancer in women. °CAUSES  °The exact cause of female breast cancer is unknown.  °RISK FACTORS °· Age older than 55 years. °· Family history of breast cancer. °· Having the BRCA1 and BRCA2 genes. °· Personal history of radiation exposure. °· Obesity. °· Menstrual periods that begin before age 12 years. °· Menopause that begins after age 55 years. °· Pregnant for the first time at the age of 35 years or older. °· Using hormone therapy. °· Drinking more than one alcoholic drink per day. °SIGNS AND SYMPTOMS  °· A painless lump in your breast. °· Changes in the size or shape of your breast. °· Breast skin changes, such as puckering or dimpling. °· Nipple abnormalities, such as scaling, crustiness, redness, or pulling in (retraction). °· Nipple discharge that is bloody or clear. °DIAGNOSIS  °Your health care provider will ask about your medical history. He or she may also perform a number of procedures, such as: °· A physical exam. This will involve feeling the tissue around the breast and under the arms. °· Taking a sample of nipple discharge. The sample will be examined under a microscope. °· Breast X-rays (mammogram), breast ultrasound exams, or an MRI. °· Taking a tissue sample (biopsy) from the breast. The sample will be examined under a microscope to look for cancer cells. °Your cancer will be staged to determine its severity and extent. Staging is a careful attempt to find out the size of the tumor, whether the cancer has spread, and if so, to what parts of the body. You may need to have more tests to determine the stage of your cancer: °· Stage 0--The tumor has not  spread to other breast tissue. °· Stage I--The cancer is only found in the breast. The tumor may be up to ¾ in (2 cm) wide. °· Stage II--The cancer has spread to nearby lymph nodes. The tumor may be up to 2 in (5 cm) wide. °· Stage III--The cancer has spread to more distant lymph nodes. The tumor may be larger than 2 in (5 cm) wide. °· Stage IV--The cancer has spread to other parts of the body, such as the bones, brain, liver, or lungs. °TREATMENT  °Depending on the type and stage, female breast cancer may be treated with one or more of the following therapies: °· Surgery to remove just the tumor (lumpectomy) or the entire breast (mastectomy). Lymph nodes may also be removed. °· Radiation therapy, which uses high-energy rays to kill cancer cells. °· Chemotherapy, which is the use of drugs to kill cancer cells. °· Hormone therapy, which involves taking medicine to adjust the hormone levels in your body. You may take medicine to decrease your estrogen levels. This can help stop cancer cells from growing. °HOME CARE INSTRUCTIONS  °· Take medicines only as directed by your health care provider. °· Maintain a healthy diet. °· Consider joining a support group. This may help you learn to cope with the stress of having breast cancer. °· Keep all follow-up appointments as directed by your health care provider. °SEEK MEDICAL CARE IF: °· You have a sudden increase in pain. °· You notice a new lump in either   breast or under your arm. °· You develop swelling in either arm or hand. °· You lose weight without trying. °· You have a fever. °· You notice new fatigue or weakness. °SEEK IMMEDIATE MEDICAL CARE IF:  °· You have chest pain or trouble breathing. °· You faint. °  °This information is not intended to replace advice given to you by your health care provider. Make sure you discuss any questions you have with your health care provider. °  °Document Released: 01/22/2006 Document Revised: 07/05/2015 Document Reviewed:  12/08/2013 °Elsevier Interactive Patient Education ©2016 [employer] Inc[employer] ° °

## 2015-09-18 NOTE — [facility] Provider Note (Signed)
History     CSN: [id number]  Arrival date and time: 09/18/15 [id number]   First Provider Initiated Contact with Patient 09/18/15 2014      No chief complaint on file.  HPI  Yolanda White is a 44 y.o. [phone] with breast caner. She presents today with left breast pain that started earlier today. The onset was gradual, and it has been worsening throughout the day. She rates her current pain 10/10. No aggravating or alleviating factors. She has taken tylenol and advil PM, and neither have helped. She had a biopsy on 09/01/15. She states that prior to the biopsy she had a lot of pain, and pressure. She states that this pain and pressure is the same feeling. Patient states that she asked her daughter to bring her [facility], but she was brought here. She states that she [facility] to be [facility].   Past Medical History  Diagnosis Date  . Asthma   . Seasonal allergies   . Breast cancer of upper-inner quadrant of left female breast ([place]) 09/08/2015  . Breast cancer ([place])   . Anxiety   . Depression     Past Surgical History  Procedure Laterality Date  . Abdominal hysterectomy      Family History  Problem Relation Age of Onset  . Diabetes Mother   . Diabetes Father   . Lung cancer Father 71    smoker  . Cancer Paternal Aunt     cervical  . Diabetes Brother   . Heart attack Paternal Grandmother     in her 54s  . Diabetes Brother     maternal half brother    Social History  Substance Use Topics  . Smoking status: Current Every Day Smoker -- 0.30 packs/day    Types: Cigarettes  . Smokeless tobacco: None  . Alcohol Use: No    Allergies: No Known Allergies  Prescriptions prior to admission  Medication Sig Dispense Refill Last Dose  . acetaminophen (TYLENOL) 500 MG tablet Take 1,000 mg by mouth every 6 (six) hours as needed.     Marland Kitchen ibuprofen (ADVIL,MOTRIN) 200 MG tablet Take 400 mg by mouth every 6 (six) hours as needed.   09/18/2015 at Unknown time  . albuterol (PROVENTIL)  (2.5 MG/3ML) 0.083% nebulizer solution Take 2.5 mg by nebulization every 6 (six) hours as needed for wheezing or shortness of breath.   Not Taking  . cetirizine-pseudoephedrine (ZYRTEC-D) 5-120 MG per tablet Take 1 tablet by mouth 2 (two) times daily. (Patient not taking: Reported on 08/08/2015) 15 tablet 0 Not Taking  . cyclobenzaprine (FLEXERIL) 10 MG tablet Take 1 tablet (10 mg total) by mouth 2 (two) times daily as needed for muscle spasms. (Patient not taking: Reported on 10/06/2014) 15 tablet 0 Not Taking  . HYDROcodone-acetaminophen (NORCO/VICODIN) 5-325 MG per tablet Take 1-2 tablets by mouth every 4 (four) hours as needed. (Patient not taking: Reported on 10/16/2014) 10 tablet 0 Not Taking  . ibuprofen (ADVIL,MOTRIN) 800 MG tablet Take 1 tablet (800 mg total) by mouth 3 (three) times daily. (Patient not taking: Reported on 08/08/2015) 21 tablet 0 Not Taking  . naproxen (NAPROSYN) 500 MG tablet Take 1 tablet (500 mg total) by mouth 2 (two) times daily. (Patient not taking: Reported on 08/25/2015) 30 tablet 0 Not Taking  . ondansetron (ZOFRAN) 4 MG tablet Take 1 tablet (4 mg total) by mouth every 6 (six) hours. (Patient not taking: Reported on 10/16/2014) 12 tablet 0 Not Taking  . oxyCODONE-acetaminophen (PERCOCET/ROXICET) 5-325  MG tablet Take 1-2 tablets by mouth every 6 (six) hours as needed for severe pain. (Patient not taking: Reported on 08/25/2015) 15 tablet 0 Not Taking  . predniSONE (DELTASONE) 20 MG tablet Take 3 tablets (60 mg total) by mouth daily. (Patient not taking: Reported on 08/08/2015) 9 tablet 0 Not Taking    Review of Systems  Constitutional: Negative for fever and chills.  Gastrointestinal: Positive for nausea, vomiting (x2 today ) and diarrhea. Negative for abdominal pain and constipation.  Genitourinary: Negative for dysuria, urgency and frequency.   Physical Exam   Blood pressure 112/72, pulse 77, temperature 98.4 F (36.9 C), temperature source Oral, resp. rate 18,  SpO2 100 %.  Physical Exam  Nursing note and vitals reviewed. Constitutional: She is oriented to person, place, and time. She appears well-developed and well-nourished.  HENT:  Head: Normocephalic.  Eyes: Pupils are equal, round, and reactive to light.  Cardiovascular: Normal rate.   Respiratory: Effort normal. Right breast exhibits no inverted nipple, no mass, no nipple discharge, no skin change and no tenderness. Left breast exhibits mass and tenderness. Breasts are asymmetrical.    GI: Soft. There is no tenderness.  Neurological: She is alert and oriented to person, place, and time.  Skin: Skin is warm and dry.  Psychiatric: She has a normal mood and affect.    MAU Course  Procedures  MDM 2032: D/W Dr. Alvino Chapel, accepts transfer. Patient given percocet and zofran here   Assessment and Plan   1. Cancer associated pain   2. Malignant neoplasm of left female breast, unspecified site of breast ([place])   3. Breast cancer of upper-inner quadrant of left female breast ([place])    DC in stable condition Patient will have her daughter bring her to [facility] ED  Comfort measures reviewed  RX: none    Follow-up Information    Follow up with [facility].   Specialty:  Emergency Medicine   Why:  please go there to be seen    Contact information:   [address] [id number] [place] [facility] [place], [place] 09/18/2015, 8:21 PM

## 2015-09-19 ENCOUNTER — Telehealth: Payer: Self-pay | Admitting: Hematology

## 2015-09-19 MED ORDER — HYDROMORPHONE HCL 1 MG/ML IJ SOLN
0.5000 mg | Freq: Once | INTRAMUSCULAR | Status: AC
  Administered 2015-09-19: 0.5 mg via INTRAMUSCULAR
  Filled 2015-09-19: qty 1

## 2015-09-19 MED ORDER — OXYCODONE-ACETAMINOPHEN 5-325 MG PO TABS: 1.0000 | ORAL_TABLET | Freq: Four times a day (QID) | ORAL | Status: DC | PRN

## 2015-09-19 MED ORDER — KETOROLAC TROMETHAMINE 30 MG/ML IJ SOLN
30.0000 mg | Freq: Once | INTRAMUSCULAR | Status: AC
  Administered 2015-09-19: 30 mg via INTRAMUSCULAR
  Filled 2015-09-19: qty 1

## 2015-09-19 NOTE — ED Provider Notes (Signed)
CSN: [id number]     Arrival date & time 09/18/15  2137 History   First MD Initiated Contact with Patient 09/19/15 0105     Chief Complaint  Patient presents with  . Breast Pain     (Consider location/radiation/quality/duration/timing/severity/associated sxs/prior Treatment) The history is provided by the patient.   patient resents with pain in her breasts on the left side. She has known cancer the site and has been hurting her for last couple months. She had some pain control with her pain meds but his been getting worse at times. Worse with certain movements. No trauma. She is scheduled for a lumpectomy with nodes in around one month. She sees Dr. Excell Seltzer. No new trauma. No shortness of breath. This is same pain that she has been having. States she was told it hurts because it is pulling.  Past Medical History  Diagnosis Date  . Asthma   . Seasonal allergies   . Breast cancer of upper-inner quadrant of left female breast ([place]) 09/08/2015  . Breast cancer ([place])   . Anxiety   . Depression    Past Surgical History  Procedure Laterality Date  . Abdominal hysterectomy     Family History  Problem Relation Age of Onset  . Diabetes Mother   . Diabetes Father   . Lung cancer Father 74    smoker  . Cancer Paternal Aunt     cervical  . Diabetes Brother   . Heart attack Paternal Grandmother     in her 35s  . Diabetes Brother     maternal half brother   Social History  Substance Use Topics  . Smoking status: Current Every Day Smoker -- 0.30 packs/day    Types: Cigarettes  . Smokeless tobacco: None  . Alcohol Use: No   OB History    Gravida Para Term Preterm AB TAB SAB Ectopic Multiple Living   4 3 3  1     3      Review of Systems  Constitutional: Negative for diaphoresis.  Respiratory: Negative for shortness of breath.   Cardiovascular: Positive for chest pain.  Gastrointestinal: Negative for abdominal pain.  Skin: Negative for color change.      Allergies  Review  of patient's allergies indicates no known allergies.  Home Medications   Prior to Admission medications   Medication Sig Start Date End Date Taking? Authorizing Provider  acetaminophen (TYLENOL) 500 MG tablet Take 1,000 mg by mouth every 6 (six) hours as needed for moderate pain.    Yes Historical Provider, MD  albuterol (PROVENTIL) (2.5 MG/3ML) 0.083% nebulizer solution Take 2.5 mg by nebulization every 6 (six) hours as needed for wheezing or shortness of breath.   Yes Historical Provider, MD  calcium carbonate (TUMS - DOSED IN MG ELEMENTAL CALCIUM) 500 MG chewable tablet Chew 1 tablet by mouth daily.   Yes Historical Provider, MD  Ibuprofen-Diphenhydramine Cit (IBUPROFEN PM) 200-38 MG TABS Take 2 tablets by mouth at bedtime as needed (pain/sleep).   Yes Historical Provider, MD  cyclobenzaprine (FLEXERIL) 10 MG tablet Take 1 tablet (10 mg total) by mouth 2 (two) times daily as needed for muscle spasms. Patient not taking: Reported on 10/06/2014 09/05/14   Carman Ching, PA-C  HYDROcodone-acetaminophen (NORCO/VICODIN) 5-325 MG per tablet Take 1-2 tablets by mouth every 4 (four) hours as needed. Patient not taking: Reported on 10/16/2014 09/05/14   Carman Ching, PA-C  oxyCODONE-acetaminophen (PERCOCET/ROXICET) 5-325 MG tablet Take 1-2 tablets by mouth every 6 (six) hours as  needed for severe pain. 09/19/15   Davonna Belling, MD   BP 109/72 mmHg  Pulse 58  Temp(Src) 97.7 F (36.5 C) (Oral)  Resp 16  Ht 5\' 2"  (1.575 m)  Wt 237 lb (107.502 kg)  BMI 43.34 kg/m2  SpO2 100% Physical Exam  Constitutional: She appears well-developed.  Cardiovascular: Normal rate.   Pulmonary/Chest: Effort normal. She exhibits tenderness.  Some tenderness to mid breast above the nipple. Some retraction of skin.   Skin: Skin is warm.    ED Course  Procedures (including critical care time) Labs Review Labs Reviewed - No data to display  Imaging Review No results found. I have personally reviewed and  evaluated these images and lab results as part of my medical decision-making.   EKG Interpretation None      MDM   Final diagnoses:  Breast cancer of upper-inner quadrant of left female breast ([place])  Breast pain in female    Patient with breast pain. Likely due to her cancer. Doubt cardiac or pulmonary cause. Will give pain medicines here and have follow-up with general surgery as needed. Patient was seen at [facility] prior to this and transferred here.    Davonna Belling, MD 09/19/15 [phone]

## 2015-09-19 NOTE — Discharge Instructions (Signed)
Breast Cancer, Female °Breast cancer is an abnormal growth of tissue (tumor) in the breast that is cancerous (malignant). Unlike noncancerous (benign) tumors, malignant tumors can spread to other parts of your body. The most common type of female breast cancer begins in the milk ducts (ductal carcinoma). Breast cancer is one of the most common types of cancer in women. °CAUSES  °The exact cause of female breast cancer is unknown.  °RISK FACTORS °· Age older than 55 years. °· Family history of breast cancer. °· Having the BRCA1 and BRCA2 genes. °· Personal history of radiation exposure. °· Obesity. °· Menstrual periods that begin before age 12 years. °· Menopause that begins after age 55 years. °· Pregnant for the first time at the age of 35 years or older. °· Using hormone therapy. °· Drinking more than one alcoholic drink per day. °SIGNS AND SYMPTOMS  °· A painless lump in your breast. °· Changes in the size or shape of your breast. °· Breast skin changes, such as puckering or dimpling. °· Nipple abnormalities, such as scaling, crustiness, redness, or pulling in (retraction). °· Nipple discharge that is bloody or clear. °DIAGNOSIS  °Your health care provider will ask about your medical history. He or she may also perform a number of procedures, such as: °· A physical exam. This will involve feeling the tissue around the breast and under the arms. °· Taking a sample of nipple discharge. The sample will be examined under a microscope. °· Breast X-rays (mammogram), breast ultrasound exams, or an MRI. °· Taking a tissue sample (biopsy) from the breast. The sample will be examined under a microscope to look for cancer cells. °Your cancer will be staged to determine its severity and extent. Staging is a careful attempt to find out the size of the tumor, whether the cancer has spread, and if so, to what parts of the body. You may need to have more tests to determine the stage of your cancer: °· Stage 0--The tumor has not  spread to other breast tissue. °· Stage I--The cancer is only found in the breast. The tumor may be up to ¾ in (2 cm) wide. °· Stage II--The cancer has spread to nearby lymph nodes. The tumor may be up to 2 in (5 cm) wide. °· Stage III--The cancer has spread to more distant lymph nodes. The tumor may be larger than 2 in (5 cm) wide. °· Stage IV--The cancer has spread to other parts of the body, such as the bones, brain, liver, or lungs. °TREATMENT  °Depending on the type and stage, female breast cancer may be treated with one or more of the following therapies: °· Surgery to remove just the tumor (lumpectomy) or the entire breast (mastectomy). Lymph nodes may also be removed. °· Radiation therapy, which uses high-energy rays to kill cancer cells. °· Chemotherapy, which is the use of drugs to kill cancer cells. °· Hormone therapy, which involves taking medicine to adjust the hormone levels in your body. You may take medicine to decrease your estrogen levels. This can help stop cancer cells from growing. °HOME CARE INSTRUCTIONS  °· Take medicines only as directed by your health care provider. °· Maintain a healthy diet. °· Consider joining a support group. This may help you learn to cope with the stress of having breast cancer. °· Keep all follow-up appointments as directed by your health care provider. °SEEK MEDICAL CARE IF: °· You have a sudden increase in pain. °· You notice a new lump in either   breast or under your arm.  You develop swelling in either arm or hand.  You lose weight without trying.  You have a fever.  You notice new fatigue or weakness. SEEK IMMEDIATE MEDICAL CARE IF:   You have chest pain or trouble breathing.  You faint.   This information is not intended to replace advice given to you by your health care provider. Make sure you discuss any questions you have with your health care provider.   Document Released: 01/22/2006 Document Revised: 07/05/2015 Document Reviewed:  12/08/2013 Elsevier Interactive Patient Education [employer].

## 2015-09-20 ENCOUNTER — Encounter: Payer: Self-pay | Admitting: Internal Medicine

## 2015-09-20 ENCOUNTER — Ambulatory Visit (INDEPENDENT_AMBULATORY_CARE_PROVIDER_SITE_OTHER): Payer: Medicaid Other | Admitting: Internal Medicine

## 2015-09-20 VITALS — BP 106/60 | HR 78 | Ht 62.5 in | Wt 240.0 lb

## 2015-09-20 DIAGNOSIS — Z720 Tobacco use: Secondary | ICD-10-CM | POA: Diagnosis not present

## 2015-09-20 DIAGNOSIS — R0600 Dyspnea, unspecified: Secondary | ICD-10-CM | POA: Insufficient documentation

## 2015-09-20 DIAGNOSIS — F17210 Nicotine dependence, cigarettes, uncomplicated: Secondary | ICD-10-CM

## 2015-09-20 NOTE — Patient Instructions (Addendum)
The key is to stop smoking completely before smoking completely stops you - it's certainly not too late in your case!   You are cleared for surgery

## 2015-09-20 NOTE — Progress Notes (Signed)
Subjective:    Patient ID: Yolanda White, female    DOB: 08-31-1971,    MRN: [id number]  HPI  55 yow latino female active smoker with AB x 2006 referred 09/20/2015 by Dr Excell Seltzer for preop pulmonary eval for breast surgery with nl spirometry 09/20/2015    09/20/2015 1st [facility] office visit/ Yolanda White   Chief Complaint  Patient presents with  . Pulmonary Consult    Referred by Dr Excell Seltzer for pulmonary clearance for breast surgery. Pt states she has a dx of asthma, and recently has been "gasping for air"- relates to stress. She uses albuterol inhaler 1 x per wk on average.   no symptoms x one month including no gasping/ sleeping ok Doe x across a building/ slow pace=   MMRC2 = can't walk a nl pace on a flat grade s sob/ does fine if paces herself No need for saba x one month in any form though avg about once a week previously and still smokiing triggeers are typically uri or stress related. Now able to sleep ok on 2 pillows s gasping/coughing.  No obvious other patterns in day to day or daytime variabilty or assoc chronic cough or cp or chest tightness, subjective wheeze overt sinus or hb symptoms. No unusual exp hx or h/o childhood pna/ asthma or knowledge of premature birth.  Sleeping ok on 2 pillows  now without nocturnal  or early am exacerbation  of respiratory  c/o's or need for noct saba. Also denies any obvious fluctuation of symptoms with weather or environmental changes or other aggravating or alleviating factors except as outlined above   Current Medications, Allergies, Complete Past Medical History, Past Surgical History, Family History, and Social History were reviewed in [employer] record.            Review of Systems  Constitutional: Negative for fever, chills and unexpected weight change.  HENT: Negative for congestion, dental problem, ear pain, nosebleeds, postnasal drip, rhinorrhea, sinus pressure, sneezing, sore throat,  trouble swallowing and voice change.   Eyes: Negative for visual disturbance.  Respiratory: Positive for shortness of breath. Negative for cough and choking.   Cardiovascular: Negative for chest pain and leg swelling.  Gastrointestinal: Negative for vomiting, abdominal pain and diarrhea.  Genitourinary: Negative for difficulty urinating.  Musculoskeletal: Negative for arthralgias.  Skin: Negative for rash.  Neurological: Negative for tremors, syncope and headaches.  Hematological: Does not bruise/bleed easily.       Objective:   Physical Exam   amb obese latino female nad  Wt Readings from Last 3 Encounters:  09/20/15 240 lb (108.863 kg)  09/18/15 237 lb (107.502 kg)  09/13/15 237 lb 4.8 oz (107.639 kg)    Vital signs reviewed   HEENT: nl dentition, turbinates, and oropharynx. Nl external ear canals without cough reflex   NECK :  without JVD/Nodes/TM/ nl carotid upstrokes bilaterally   LUNGS: no acc muscle use, clear to A and P bilaterally without cough on insp or exp maneuvers   CV:  RRR  no s3 or murmur or increase in P2, no edema   ABD: mod obese but  soft and nontender with nl excursion in the supine position. No bruits or organomegaly, bowel sounds nl  MS:  warm without deformities, calf tenderness, cyanosis or clubbing  SKIN: warm and dry without lesions    NEURO:  alert, approp, no deficits     I personally reviewed images and agree with radiology impression as follows:  CXR:  08/08/15  No active cardiopulmonary disease      Assessment & Plan:

## 2015-09-21 DIAGNOSIS — F17210 Nicotine dependence, cigarettes, uncomplicated: Secondary | ICD-10-CM | POA: Insufficient documentation

## 2015-09-21 NOTE — Assessment & Plan Note (Signed)
This is the likely source of her variable noct symptoms (or she is having noct gerd or intermittent asthma)   Body mass index is 43.17 kg/(m^2).  No results found for: TSH   Contributing to gerd tendency/ doe/reviewed the need and the process to achieve and maintain neg calorie balance > defer f/u primary care including intermittently monitoring thyroid status

## 2015-09-21 NOTE — Assessment & Plan Note (Addendum)
>   3 min discussion I reviewed the Fletcher curve with the patient that basically indicates  if you quit smoking when your best day FEV1 is still well preserved (as is clearly  the case here)  it is highly unlikely you will progress to severe disease and informed the patient there was no medication on the market that has proven to alter the curve/ its downward trajectory  or the likelihood of progression of their disease.  Therefore stopping smoking and maintaining abstinence is the most important aspect of care, not choice of inhalers or for that matter, doctors.    In absence of evidence of airflow obst at present, all I rec is prn saba and f/u here if use goes up

## 2015-09-21 NOTE — Assessment & Plan Note (Addendum)
Spirometry 09/20/2015  No obst   - 09/20/2015  Walked RA x 3 laps @ 185 ft each stopped due to  End of study, brisk pace, no   desat  Min sob   Her sob is due to obesity /deconditioning, not copd and is not per se a contraindication to breast surgery but would benefit from early mobilization / IS and min narcotic analgesics.   She is cleared for surgery but ideally needs to quit smoking in meantime starting today (see cig smoking a/p)  Total time devoted to counseling  = 35/48m review case with pt/ discussion of options/alternatives/ giving and going over instructions (see avs)

## 2015-09-25 ENCOUNTER — Telehealth: Payer: Self-pay | Admitting: *Deleted

## 2015-09-25 ENCOUNTER — Encounter: Payer: Self-pay | Admitting: Hematology

## 2015-09-25 NOTE — Telephone Encounter (Signed)
Pt called and left me a message asking for financial help and to speak with a Education officer, museum.  Called Abigail and she said she would need to see the FA first.  Called pt and left her a message for call me back so I can direct her in the right direction.

## 2015-09-25 NOTE — Progress Notes (Signed)
Pt called needing assistance paying for her hotel room that she's living in right now.  After reviewing Dr. Lewayne Bunting notes, she's not on active treatment which is a requirement in order to apply for that type of assistance.  I explained this to her and apologized that as of right now we wouldn't be able to help her, she verbalized understanding.

## 2015-09-26 ENCOUNTER — Telehealth: Payer: Self-pay | Admitting: Genetic Counselor

## 2015-09-26 ENCOUNTER — Encounter: Payer: Self-pay | Admitting: Genetic Counselor

## 2015-09-26 DIAGNOSIS — Z1379 Encounter for other screening for genetic and chromosomal anomalies: Secondary | ICD-10-CM | POA: Insufficient documentation

## 2015-09-26 NOTE — Telephone Encounter (Signed)
LM on VM with good news about genetic testing.  Left CB instructions.

## 2015-09-28 HISTORY — PX: BREAST LUMPECTOMY: SHX2

## 2015-10-03 ENCOUNTER — Ambulatory Visit: Payer: Self-pay | Admitting: Genetic Counselor

## 2015-10-03 ENCOUNTER — Telehealth: Payer: Self-pay | Admitting: Genetic Counselor

## 2015-10-03 DIAGNOSIS — C50212 Malignant neoplasm of upper-inner quadrant of left female breast: Secondary | ICD-10-CM

## 2015-10-03 DIAGNOSIS — Z1379 Encounter for other screening for genetic and chromosomal anomalies: Secondary | ICD-10-CM

## 2015-10-03 NOTE — Progress Notes (Signed)
HPI: Ms. Patchen was previously seen in the [facility] due to a personal history of breast cancer and concerns regarding a hereditary predisposition to cancer. Please refer to our prior cancer genetics clinic note for more information regarding Ms. Desrocher's medical, social and family histories, and our assessment and recommendations, at the time. Ms. Feher recent genetic test results were disclosed to her, as were recommendations warranted by these results. These results and recommendations are discussed in more detail below.  FAMILY HISTORY:  We obtained a detailed, 4-generation family history.  Significant diagnoses are listed below: Family History  Problem Relation Age of Onset  . Diabetes Mother   . Diabetes Father   . Lung cancer Father 69    smoker  . Cancer Paternal Aunt     cervical  . Diabetes Brother   . Heart attack Paternal Grandmother     in her 36s  . Diabetes Brother     maternal half brother  . Asthma Brother   . Asthma Maternal Aunt     The patient has 3 daughters, two full brothers, one full sister and a maternal half brother. The youngest full brother was killed at age 21. Her mother is alive at 53. She has 18 brothers and sisters, four are full siblings. Nobody had cancer as far as the patient knows. Her father died of lung cancer in 02-08-15 at age 86. He had one full brother and two full sisters, and several paternal half siblings. One full sister died of unknown reasons, and the other full sister had cervical cancer. There is no other reported family history of cancer. Patient's maternal ancestors are of [place] descent, and paternal ancestors are of [place] and [place] descent. There is no reported Ashkenazi Jewish ancestry. There is no known consanguinity  GENETIC TEST RESULTS: At the time of Ms. Ocampo's visit, we recommended she pursue genetic testing of the [facility] gene panel. The [facility] gene panel offered by [employer] includes sequencing and deletion/duplication testing of the following 27 genes: APC, ATM, BARD1, BMPR1A, BRCA1, BRCA2, BRIP1, CHD1, CDK4, CDKN2A, CHEK2, EPCAM (large rearrangement only), MLH1, MSH2, MSH6, MUTYH, NBN, PALB2, PMS2, PTEN, RAD51C, RAD51D, SMAD4, STK11, and TP53. Sequencing was performed for select regions of POLE and POLD1, and large rearrangement analysis was performed for select regions of GREM1.  The report date is September 25, 2015.  Genetic testing was normal, and did not reveal a deleterious mutation in these genes. The test report has been scanned into EPIC and is located under the Molecular Pathology section of the Results Review tab.   We discussed with Ms. Valls that since the current genetic testing is not perfect, it is possible there may be a gene mutation in one of these genes that current testing cannot detect, but that chance is small. We also discussed, that it is possible that another gene that has not yet been discovered, or that we have not yet tested, is responsible for the cancer diagnoses in the family, and it is, therefore, important to remain in touch with cancer genetics in the future so that we can continue to offer Ms. Lightle the most up to date genetic testing.   CANCER SCREENING RECOMMENDATIONS: This result is reassuring and indicates that Ms. Grace likely does not have an increased risk for a future cancer due to a mutation in one of these genes. This normal test also suggests that Ms. Tippy's cancer was most likely not due to  an inherited predisposition associated with one of these genes.  Most cancers happen by chance and this negative test suggests that her cancer falls into this category.  We, therefore, recommended she continue to follow the cancer management and screening guidelines provided by her oncology and primary healthcare provider.   RECOMMENDATIONS FOR FAMILY MEMBERS: Women in this family might be at some increased risk of developing  cancer, over the general population risk, simply due to the family history of cancer. We recommended women in this family have a yearly mammogram beginning at age 53, or 38 years younger than the earliest onset of cancer, an an annual clinical breast exam, and perform monthly breast self-exams. Women in this family should also have a gynecological exam as recommended by their primary provider. All family members should have a colonoscopy by age 30.  FOLLOW-UP: Lastly, we discussed with Ms. Fernandez that cancer genetics is a rapidly advancing field and it is possible that new genetic tests will be appropriate for her and/or her family members in the future. We encouraged her to remain in contact with cancer genetics on an annual basis so we can update her personal and family histories and let her know of advances in cancer genetics that may benefit this family.   Our contact number was provided. Ms. Snipe questions were answered to her satisfaction, and she knows she is welcome to call us at anytime with additional questions or concerns.   Roma Kayser, MS, [facility] Certified Genetic Counselor [email or web address]

## 2015-10-03 NOTE — Telephone Encounter (Signed)
Revealed that patient was negative on the [facility] Myrisk testing.  Discussed that we do not know why she developed breast cancer at 69, and explained the limitations of testing.  Her daughters will need to be screened 10 years younger than her onset.

## 2015-10-04 ENCOUNTER — Encounter (HOSPITAL_BASED_OUTPATIENT_CLINIC_OR_DEPARTMENT_OTHER): Payer: Self-pay | Admitting: *Deleted

## 2015-10-05 ENCOUNTER — Other Ambulatory Visit: Payer: Self-pay | Admitting: General Surgery

## 2015-10-05 DIAGNOSIS — C50912 Malignant neoplasm of unspecified site of left female breast: Secondary | ICD-10-CM

## 2015-10-06 ENCOUNTER — Inpatient Hospital Stay: Admission: RE | Admit: 2015-10-06 | Payer: Self-pay | Source: Ambulatory Visit

## 2015-10-10 ENCOUNTER — Ambulatory Visit
Admission: RE | Admit: 2015-10-10 | Discharge: 2015-10-10 | Disposition: A | Discharge status: Home / Self Care | Payer: No Typology Code available for payment source | Source: Ambulatory Visit | Attending: General Surgery | Admitting: General Surgery

## 2015-10-10 NOTE — H&P (Signed)
Yolanda White. Bahena 09/13/2015 7:58 AM Location: [facility] Patient #: [id number] DOB: 12-08-1970 Undefined / Language: Yolanda White / Race: White Female   History of Present Illness Yolanda White T. Mikell Camp MD; 09/13/2015 9:59 AM) The patient is a 44 year old female who presents with breast cancer. [name] is a peri menopausal female referred by Dr. Ammie Ferrier for evaluation of recently diagnosed carcinoma of the left right breast. she recently began to experience pain in her upper left breast and noted some dimpling to her skin. She did not have insurance and presented to the emergency room for evaluation. She was examined and sent to the breast center for evaluation. Subsequent imaging included diagnostic mamogram showing an abnormal density in the upper left breast and ultrasound showing a hypo-echoic mass at the 11:00 position in the upper left breast measuring 1.2 cm in greatest diameter. An ultrasound guided breast biopsy was performed on 09/01/2015 with pathology revealing invasive ductal carcinoma of the breast. She is seen now in breast multidisciplinary clinic for initial treatment planning. She has experienced breast discomfort and skin dimpling as noted above. She does not have a personal history of any previous breast problems.  Findings at that time were the following: Tumor size: 1.2 cm Tumor grade: 1 Estrogen Receptor: positive Progesterone Receptor: positive Her-2 neu: negative Lymph node status: negative    Other Problems [name], RN; 09/13/2015 7:59 AM) Anxiety Disorder Asthma Bladder Problems Breast Cancer Gastroesophageal Reflux Disease Heart murmur Lump In Breast Migraine Headache  Past Surgical History [name], RN; 09/13/2015 7:59 AM) Breast Biopsy Left. Hysterectomy (not due to cancer) - Partial Oral Surgery  Diagnostic Studies History [name], RN; 09/13/2015 7:59 AM) Colonoscopy never Mammogram within last  year Pap Smear >5 years ago  Medication History [name], RN; 09/13/2015 7:59 AM) No Current Medications Medications Reconciled  Social History [name], RN; 09/13/2015 7:59 AM) Alcohol use Remotely quit alcohol use. Caffeine use Carbonated beverages, Coffee. No drug use Tobacco use Current every day smoker.  Family History [name], RN; 09/13/2015 7:59 AM) Cervical Cancer Family Members In General. Diabetes Mellitus Brother, Family Members In General, Father, Mother. Hypertension Mother. Respiratory Condition Family Members In General.  Pregnancy / Birth History [name], RN; 09/13/2015 7:59 AM) Age at menarche 78 years. Age of menopause <46 Contraceptive History Contraceptive implant. Gravida 4 Irregular periods Maternal age 26-20 Para 3    Review of Systems [name] RN; 09/13/2015 7:59 AM) General Present- Fatigue and Weight Gain. Not Present- Appetite Loss, Chills, Fever, Night Sweats and Weight Loss. Skin Present- Change in Wart/Mole and New Lesions. Not Present- Dryness, Hives, Jaundice, Non-Healing Wounds, Rash and Ulcer. HEENT Present- Earache, Seasonal Allergies, Sinus Pain, Sore Throat and Wears glasses/contact lenses. Not Present- Hearing Loss, Hoarseness, Nose Bleed, Oral Ulcers, Ringing in the Ears, Visual Disturbances and Yellow Eyes. Respiratory Present- Snoring and Wheezing. Not Present- Bloody sputum, Chronic Cough and Difficulty Breathing. Breast Present- Breast Mass, Breast Pain, Nipple Discharge and Skin Changes. Cardiovascular Present- Difficulty Breathing Lying Down, Leg Cramps, Rapid Heart Rate and Shortness of Breath. Not Present- Chest Pain, Palpitations and Swelling of Extremities. Gastrointestinal Present- Change in Bowel Habits, Constipation, Excessive gas, Gets full quickly at meals, Indigestion and Nausea. Not Present- Abdominal Pain, Bloating, Bloody Stool, Chronic diarrhea, Difficulty Swallowing, Hemorrhoids,  Rectal Pain and Vomiting. Female Genitourinary Present- Frequency, Nocturia, Painful Urination, Pelvic Pain and Urgency. Musculoskeletal Present- Back Pain. Not Present- Joint Pain, Joint Stiffness, Muscle Pain, Muscle Weakness and Swelling of Extremities. Neurological  Present- Headaches, Numbness, Tingling, Tremor and Weakness. Not Present- Decreased Memory, Fainting, Seizures and Trouble walking. Psychiatric Present- Anxiety, Change in Sleep Pattern and Fearful. Not Present- Bipolar, Depression and Frequent crying. Endocrine Present- Cold Intolerance and Heat Intolerance. Not Present- Excessive Hunger, Hair Changes, Hot flashes and New Diabetes. Hematology Present- Easy Bruising and Excessive bleeding. Not Present- Gland problems, HIV and Persistent Infections.   Physical Exam Yolanda White T. Keiasha Diep MD; 09/13/2015 10:00 AM) The physical exam findings are as follows: Note:General: Alert, moderately obese African-American female, in no distress Skin: Warm and dry without rash or infection. HEENT: No palpable masses or thyromegaly. Sclera nonicteric. Pupils equal round and reactive. Lymph nodes: No cervical, supraclavicular, or inguinal nodes palpable. Breasts: There is a small area of skin dimpling at the 12:00 position in the upper left breast. There is some thickening at the 11:00 position.  No other abnormalities in either breast.No palpable axillary adenopathy. Lungs: Breath sounds clear and equal. No wheezing or increased work of breathing. Cardiovascular: Regular rate and rhythm without murmer. No JVD or edema. Peripheral pulses intact. Abdomen: Nondistended. Soft and nontender. No masses palpable. No organomegaly. No palpable hernias. Extremities: No edema or joint swelling or deformity. No chronic venous stasis changes. Neurologic: Alert and fully oriented. Gait normal. No focal weakness. Psychiatric: Normal mood and affect. Thought content appropriate with normal judgement and  insight    Assessment & Plan Yolanda White T. Ziv Welchel MD; 09/13/2015 10:10 AM) MALIGNANT NEOPLASM OF UPPER-INNER QUADRANT OF LEFT FEMALE BREAST (J17.915) Impression: 44 year old female with a new diagnosis of cancer of the left breast, upper inner quadrant. Clinical stage 1A, ER pos, PR pos, HER-2 neg. I discussed with the patient and family members present today initial surgical treatment options. We discussed options of breast conservation with lumpectomy or total mastectomy and sentinal lymph node biopsy/dissection. Options for reconstruction were discussed. After discussion they have elected to proceed with breast conservation with radioactive seed localized lefph node biopsyctomy and left axillary sentinel lymph node biopsy. We discussed the indications and nature of the procedure, and expected recovery, in detail. Surgical risks including anesthetic complications, cardiorespiratory complications, bleeding, infection, wound healing complications, blood clots, lymphedema, local and distant recurrence and possible need for further surgery based on the final pathology was discussed and understood. Chemotherapy, hormonal therapy and radiation therapy have been discussed. They have been provided with literature regarding the treatment of breast cancer. on review of systems she is having significant shortness of breath on exertion and has trouble lying flat. Has a rently.s history of asthma but not on any meds currently. I would like to get a preoperative pulmonary clearanceprior to surgery. she will need genetic testing due to her age and we will get this completed prior to the surgery date so that she can make a fully informed decision regarding surgical treatment Finally, she is complaining of abdominal pain on the right side and nausea on review of systems AND WE WILL OBTAIN ABDOMINAL ULTRASOUND PREOPERATIVELY Current Plans Pt Education - [employer] Breast Cancer Information Given - [facility] "Breast Journey"  Package schedule for surgery Radioactive seed localized left breast lumpectomy and left axillary sentinel lymph node biopsy Preoperative genetic testing Preoperative pulmonary evaluation Abdominal ultrasound

## 2015-10-11 ENCOUNTER — Ambulatory Visit (HOSPITAL_BASED_OUTPATIENT_CLINIC_OR_DEPARTMENT_OTHER): Payer: Medicaid Other | Admitting: Anesthesiology

## 2015-10-11 ENCOUNTER — Encounter (HOSPITAL_BASED_OUTPATIENT_CLINIC_OR_DEPARTMENT_OTHER)
Admission: RE | Disposition: A | Discharge status: Home / Self Care | Payer: Self-pay | Source: Ambulatory Visit | Attending: General Surgery

## 2015-10-11 ENCOUNTER — Encounter (HOSPITAL_BASED_OUTPATIENT_CLINIC_OR_DEPARTMENT_OTHER): Payer: Self-pay | Admitting: *Deleted

## 2015-10-11 ENCOUNTER — Ambulatory Visit (HOSPITAL_BASED_OUTPATIENT_CLINIC_OR_DEPARTMENT_OTHER)
Admission: RE | Admit: 2015-10-11 | Discharge: 2015-10-11 | Disposition: A | Discharge status: Home / Self Care | Payer: Medicaid Other | Source: Ambulatory Visit | Attending: General Surgery | Admitting: General Surgery

## 2015-10-11 ENCOUNTER — Encounter (HOSPITAL_COMMUNITY)
Admission: RE | Admit: 2015-10-11 | Discharge: 2015-10-11 | Disposition: A | Discharge status: Home / Self Care | Payer: Medicaid Other | Source: Ambulatory Visit | Attending: General Surgery | Admitting: General Surgery

## 2015-10-11 ENCOUNTER — Ambulatory Visit
Admission: RE | Admit: 2015-10-11 | Discharge: 2015-10-11 | Disposition: A | Discharge status: Home / Self Care | Payer: No Typology Code available for payment source | Source: Ambulatory Visit | Attending: General Surgery | Admitting: General Surgery

## 2015-10-11 DIAGNOSIS — C50212 Malignant neoplasm of upper-inner quadrant of left female breast: Secondary | ICD-10-CM

## 2015-10-11 DIAGNOSIS — J45909 Unspecified asthma, uncomplicated: Secondary | ICD-10-CM | POA: Diagnosis not present

## 2015-10-11 DIAGNOSIS — Z170 Estrogen receptor positive status [ER+]: Secondary | ICD-10-CM | POA: Diagnosis not present

## 2015-10-11 DIAGNOSIS — K219 Gastro-esophageal reflux disease without esophagitis: Secondary | ICD-10-CM | POA: Insufficient documentation

## 2015-10-11 DIAGNOSIS — C50912 Malignant neoplasm of unspecified site of left female breast: Secondary | ICD-10-CM

## 2015-10-11 DIAGNOSIS — F17210 Nicotine dependence, cigarettes, uncomplicated: Secondary | ICD-10-CM | POA: Insufficient documentation

## 2015-10-11 HISTORY — PX: RADIOACTIVE SEED GUIDED PARTIAL MASTECTOMY WITH AXILLARY SENTINEL LYMPH NODE BIOPSY: SHX6520

## 2015-10-11 SURGERY — RADIOACTIVE SEED GUIDED PARTIAL MASTECTOMY WITH AXILLARY SENTINEL LYMPH NODE BIOPSY
Anesthesia: General | Site: Breast | Laterality: Left

## 2015-10-11 MED ORDER — OXYCODONE HCL 5 MG PO TABS
ORAL_TABLET | ORAL | Status: AC
  Filled 2015-10-11: qty 1

## 2015-10-11 MED ORDER — PROPOFOL 10 MG/ML IV BOLUS
INTRAVENOUS | Status: DC | PRN
  Administered 2015-10-11: 50 mg via INTRAVENOUS
  Administered 2015-10-11: 200 mg via INTRAVENOUS

## 2015-10-11 MED ORDER — FENTANYL CITRATE (PF) 100 MCG/2ML IJ SOLN
INTRAMUSCULAR | Status: AC
  Filled 2015-10-11: qty 2

## 2015-10-11 MED ORDER — CEFAZOLIN SODIUM-DEXTROSE 2-3 GM-% IV SOLR
INTRAVENOUS | Status: AC
  Filled 2015-10-11: qty 50

## 2015-10-11 MED ORDER — MIDAZOLAM HCL 2 MG/2ML IJ SOLN
INTRAMUSCULAR | Status: AC
  Filled 2015-10-11: qty 2

## 2015-10-11 MED ORDER — MIDAZOLAM HCL 2 MG/2ML IJ SOLN
1.0000 mg | INTRAMUSCULAR | Status: DC | PRN
  Administered 2015-10-11: 2 mg via INTRAVENOUS

## 2015-10-11 MED ORDER — DEXTROSE 5 % IV SOLN
3.0000 g | INTRAVENOUS | Status: AC
  Administered 2015-10-11: 2 g via INTRAVENOUS

## 2015-10-11 MED ORDER — FENTANYL CITRATE (PF) 100 MCG/2ML IJ SOLN
50.0000 ug | INTRAMUSCULAR | Status: AC | PRN
  Administered 2015-10-11: 100 ug via INTRAVENOUS
  Administered 2015-10-11: 50 ug via INTRAVENOUS
  Administered 2015-10-11: 25 ug via INTRAVENOUS
  Administered 2015-10-11: 100 ug via INTRAVENOUS
  Administered 2015-10-11: 25 ug via INTRAVENOUS

## 2015-10-11 MED ORDER — DEXAMETHASONE SODIUM PHOSPHATE 4 MG/ML IJ SOLN
INTRAMUSCULAR | Status: DC | PRN
  Administered 2015-10-11: 10 mg via INTRAVENOUS

## 2015-10-11 MED ORDER — SCOPOLAMINE 1 MG/3DAYS TD PT72: 1.0000 | MEDICATED_PATCH | Freq: Once | TRANSDERMAL | Status: DC | PRN

## 2015-10-11 MED ORDER — TECHNETIUM TC 99M SULFUR COLLOID FILTERED
1.0000 | Freq: Once | INTRAVENOUS | Status: AC | PRN
  Administered 2015-10-11: 1 via INTRADERMAL

## 2015-10-11 MED ORDER — LIDOCAINE HCL (CARDIAC) 20 MG/ML IV SOLN
INTRAVENOUS | Status: DC | PRN
  Administered 2015-10-11: 100 mg via INTRAVENOUS

## 2015-10-11 MED ORDER — CHLORHEXIDINE GLUCONATE 4 % EX LIQD: 1.0000 "application " | Freq: Once | CUTANEOUS | Status: DC

## 2015-10-11 MED ORDER — ONDANSETRON HCL 4 MG/2ML IJ SOLN
4.0000 mg | Freq: Once | INTRAMUSCULAR | Status: AC | PRN
  Administered 2015-10-11: 4 mg via INTRAVENOUS

## 2015-10-11 MED ORDER — LIDOCAINE HCL (CARDIAC) 20 MG/ML IV SOLN
INTRAVENOUS | Status: AC
Start: 2015-10-11 — End: 2015-10-11
  Filled 2015-10-11: qty 5

## 2015-10-11 MED ORDER — GLYCOPYRROLATE 0.2 MG/ML IJ SOLN: 0.2000 mg | Freq: Once | INTRAMUSCULAR | Status: DC | PRN

## 2015-10-11 MED ORDER — BUPIVACAINE-EPINEPHRINE (PF) 0.5% -1:200000 IJ SOLN
INTRAMUSCULAR | Status: DC | PRN
  Administered 2015-10-11: 10 mL via PERINEURAL

## 2015-10-11 MED ORDER — DEXAMETHASONE SODIUM PHOSPHATE 10 MG/ML IJ SOLN
INTRAMUSCULAR | Status: AC
  Filled 2015-10-11: qty 1

## 2015-10-11 MED ORDER — HYDROMORPHONE HCL 1 MG/ML IJ SOLN
INTRAMUSCULAR | Status: AC
  Filled 2015-10-11: qty 1

## 2015-10-11 MED ORDER — LACTATED RINGERS IV SOLN
INTRAVENOUS | Status: DC
  Administered 2015-10-11: 10 mL/h via INTRAVENOUS
  Administered 2015-10-11: 11:00:00 via INTRAVENOUS
  Administered 2015-10-11: 10 mL/h via INTRAVENOUS

## 2015-10-11 MED ORDER — MEPERIDINE HCL 25 MG/ML IJ SOLN: 6.2500 mg | INTRAMUSCULAR | Status: DC | PRN

## 2015-10-11 MED ORDER — HYDROMORPHONE HCL 1 MG/ML IJ SOLN
0.2500 mg | INTRAMUSCULAR | Status: DC | PRN
  Administered 2015-10-11 (×2): 0.5 mg via INTRAVENOUS

## 2015-10-11 MED ORDER — ONDANSETRON HCL 4 MG/2ML IJ SOLN
INTRAMUSCULAR | Status: AC
  Filled 2015-10-11: qty 2

## 2015-10-11 MED ORDER — OXYCODONE HCL 5 MG PO TABS
5.0000 mg | ORAL_TABLET | Freq: Once | ORAL | Status: AC | PRN
  Administered 2015-10-11: 5 mg via ORAL

## 2015-10-11 MED ORDER — HYDROCODONE-ACETAMINOPHEN 5-325 MG PO TABS: 1.0000 | ORAL_TABLET | ORAL | Status: DC | PRN

## 2015-10-11 SURGICAL SUPPLY — 55 items
APPLIER CLIP 9.375 MED OPEN (MISCELLANEOUS) ×3
APR CLP MED 9.3 20 MLT OPN (MISCELLANEOUS) ×1
BINDER BREAST LRG (GAUZE/BANDAGES/DRESSINGS) IMPLANT
BINDER BREAST MEDIUM (GAUZE/BANDAGES/DRESSINGS) IMPLANT
BINDER BREAST XLRG (GAUZE/BANDAGES/DRESSINGS) IMPLANT
BINDER BREAST XXLRG (GAUZE/BANDAGES/DRESSINGS) IMPLANT
BLADE SURG 15 STRL LF DISP TIS (BLADE) ×1 IMPLANT
BLADE SURG 15 STRL SS (BLADE) ×3
CANISTER SUC SOCK COL 7IN (MISCELLANEOUS) ×2 IMPLANT
CANISTER SUCT 1200ML W/VALVE (MISCELLANEOUS) ×2 IMPLANT
CHLORAPREP W/TINT 26ML (MISCELLANEOUS) ×3 IMPLANT
CLIP APPLIE 9.375 MED OPEN (MISCELLANEOUS) ×1 IMPLANT
COVER BACK TABLE 60X90IN (DRAPES) ×3 IMPLANT
COVER MAYO STAND STRL (DRAPES) ×3 IMPLANT
COVER PROBE W GEL 5X96 (DRAPES) ×3 IMPLANT
DECANTER SPIKE VIAL GLASS SM (MISCELLANEOUS) ×2 IMPLANT
DEVICE DUBIN W/COMP PLATE 8390 (MISCELLANEOUS) ×3 IMPLANT
DRAPE LAPAROSCOPIC ABDOMINAL (DRAPES) ×3 IMPLANT
DRAPE UTILITY XL STRL (DRAPES) ×3 IMPLANT
ELECT COATED BLADE 2.86 ST (ELECTRODE) ×3 IMPLANT
ELECT REM PT RETURN 9FT ADLT (ELECTROSURGICAL) ×3
ELECTRODE REM PT RTRN 9FT ADLT (ELECTROSURGICAL) ×1 IMPLANT
GLOVE BIOGEL PI IND STRL 6.5 (GLOVE) IMPLANT
GLOVE BIOGEL PI IND STRL 7.0 (GLOVE) IMPLANT
GLOVE BIOGEL PI IND STRL 8 (GLOVE) ×1 IMPLANT
GLOVE BIOGEL PI INDICATOR 6.5 (GLOVE) ×2
GLOVE BIOGEL PI INDICATOR 7.0 (GLOVE) ×2
GLOVE BIOGEL PI INDICATOR 8 (GLOVE) ×2
GLOVE ECLIPSE 6.5 STRL STRAW (GLOVE) ×2 IMPLANT
GLOVE ECLIPSE 7.5 STRL STRAW (GLOVE) ×3 IMPLANT
GOWN STRL REUS W/ TWL LRG LVL3 (GOWN DISPOSABLE) ×1 IMPLANT
GOWN STRL REUS W/ TWL XL LVL3 (GOWN DISPOSABLE) ×1 IMPLANT
GOWN STRL REUS W/TWL LRG LVL3 (GOWN DISPOSABLE) ×3
GOWN STRL REUS W/TWL XL LVL3 (GOWN DISPOSABLE) ×3
ILLUMINATOR WAVEGUIDE N/F (MISCELLANEOUS) ×2 IMPLANT
KIT MARKER MARGIN INK (KITS) ×3 IMPLANT
LIQUID BAND (GAUZE/BANDAGES/DRESSINGS) ×3 IMPLANT
NDL HYPO 25X1 1.5 SAFETY (NEEDLE) ×2 IMPLANT
NDL SAFETY ECLIPSE 18X1.5 (NEEDLE) ×1 IMPLANT
NEEDLE HYPO 18GX1.5 SHARP (NEEDLE) ×3
NEEDLE HYPO 25X1 1.5 SAFETY (NEEDLE) ×6 IMPLANT
NS IRRIG 1000ML POUR BTL (IV SOLUTION) ×2 IMPLANT
PACK BASIN DAY SURGERY FS (CUSTOM PROCEDURE TRAY) ×3 IMPLANT
PENCIL BUTTON HOLSTER BLD 10FT (ELECTRODE) ×3 IMPLANT
SLEEVE SCD COMPRESS KNEE MED (MISCELLANEOUS) ×3 IMPLANT
SPONGE LAP 4X18 X RAY DECT (DISPOSABLE) ×5 IMPLANT
SUT MON AB 5-0 PS2 18 (SUTURE) ×3 IMPLANT
SUT SILK 2 0 SH (SUTURE) IMPLANT
SUT VICRYL 3-0 CR8 SH (SUTURE) ×3 IMPLANT
SYR CONTROL 10ML LL (SYRINGE) ×6 IMPLANT
TOWEL OR 17X24 6PK STRL BLUE (TOWEL DISPOSABLE) ×3 IMPLANT
TOWEL OR NON WOVEN STRL DISP B (DISPOSABLE) ×3 IMPLANT
TUBE CONNECTING 20'X1/4 (TUBING) ×1
TUBE CONNECTING 20X1/4 (TUBING) ×1 IMPLANT
YANKAUER SUCT BULB TIP NO VENT (SUCTIONS) ×2 IMPLANT

## 2015-10-11 NOTE — Anesthesia Procedure Notes (Signed)
Procedure Name: LMA Insertion Date/Time: 10/11/2015 10:51 AM Performed by: Lyndee Leo Pre-anesthesia Checklist: Patient identified, Emergency Drugs available, Suction available and Patient being monitored Patient Re-evaluated:Patient Re-evaluated prior to inductionOxygen Delivery Method: Circle System Utilized Preoxygenation: Pre-oxygenation with 100% oxygen Intubation Type: IV induction Ventilation: Mask ventilation without difficulty LMA: LMA with gastric port inserted LMA Size: 4.0 Number of attempts: 1 Placement Confirmation: positive ETCO2 and breath sounds checked- equal and bilateral Tube secured with: Tape Dental Injury: Teeth and Oropharynx as per pre-operative assessment

## 2015-10-11 NOTE — Transfer of Care (Signed)
Immediate Anesthesia Transfer of Care Note  Patient: Yolanda White  Procedure(s) Performed: Procedure(s): RADIOACTIVE SEED LOCALIZATION LEFT BREAST LUMPECTOMY WITH LEFT  AXILLARY SENTINEL LYMPH NODE BIOPSY (Left)  Patient Location: PACU  Anesthesia Type:GA combined with regional for post-op pain  Level of Consciousness: awake and patient cooperative  Airway & Oxygen Therapy: Patient Spontanous Breathing and Patient connected to face mask oxygen  Post-op Assessment: Report given to RN and Post -op Vital signs reviewed and stable  Post vital signs: Reviewed and stable  Last Vitals:  Filed Vitals:   10/11/15 0955 10/11/15 1000  BP:  111/67  Pulse: 64 75  Temp:    Resp: 14 9    Complications: No apparent anesthesia complications

## 2015-10-11 NOTE — Discharge Instructions (Signed)
[facility] °Office Phone Number [phone] ° °BREAST BIOPSY/ PARTIAL MASTECTOMY: POST OP INSTRUCTIONS ° °Always review your discharge instruction sheet given to you by the facility where your surgery was performed. ° °IF YOU HAVE DISABILITY OR FAMILY LEAVE FORMS, YOU MUST BRING THEM TO THE OFFICE FOR PROCESSING.  DO NOT GIVE THEM TO YOUR DOCTOR. ° °1. A prescription for pain medication may be given to you upon discharge.  Take your pain medication as prescribed, if needed.  If narcotic pain medicine is not needed, then you may take acetaminophen (Tylenol) or ibuprofen (Advil) as needed. °2. Take your usually prescribed medications unless otherwise directed °3. If you need a refill on your pain medication, please contact your pharmacy.  They will contact our office to request authorization.  Prescriptions will not be filled after 5pm or on week-ends. °4. You should eat very light the first 24 hours after surgery, such as soup, crackers, pudding, etc.  Resume your normal diet the day after surgery. °5. Most patients will experience some swelling and bruising in the breast.  Ice packs and a good support bra will help.  Swelling and bruising can take several days to resolve.  °6. It is common to experience some constipation if taking pain medication after surgery.  Increasing fluid intake and taking a stool softener will usually help or prevent this problem from occurring.  A mild laxative (Milk of Magnesia or Miralax) should be taken according to package directions if there are no bowel movements after 48 hours. °7. Unless discharge instructions indicate otherwise, you may remove your bandages 24-48 hours after surgery, and you may shower at that time.  You may have steri-strips (small skin tapes) in place directly over the incision.  These strips should be left on the skin for 7-10 days.  If your surgeon used skin glue on the incision, you may shower in 24 hours.  The glue will flake off over the  next 2-3 weeks.  Any sutures or staples will be removed at the office during your follow-up visit. °8. ACTIVITIES:  You may resume regular daily activities (gradually increasing) beginning the next day.  Wearing a good support bra or sports bra minimizes pain and swelling.  You may have sexual intercourse when it is comfortable. °a. You may drive when you no longer are taking prescription pain medication, you can comfortably wear a seatbelt, and you can safely maneuver your car and apply brakes. °b. RETURN TO WORK:  ______________________________________________________________________________________ °9. You should see your doctor in the office for a follow-up appointment approximately two weeks after your surgery.  Your doctor’s nurse will typically make your follow-up appointment when she calls you with your pathology report.  Expect your pathology report 2-3 business days after your surgery.  You may call to check if you do not hear from us after three days. °10. OTHER INSTRUCTIONS: _______________________________________________________________________________________________ _____________________________________________________________________________________________________________________________________ °_____________________________________________________________________________________________________________________________________ °_____________________________________________________________________________________________________________________________________ ° °WHEN TO CALL YOUR DOCTOR: °1. Fever over 101.0 °2. Nausea and/or vomiting. °3. Extreme swelling or bruising. °4. Continued bleeding from incision. °5. Increased pain, redness, or drainage from the incision. ° °The clinic staff is available to answer your questions during regular business hours.  Please don’t hesitate to call and ask to speak to one of the nurses for clinical concerns.  If you have a medical emergency, go to the nearest  emergency room or call 911.  A surgeon from [facility] is always on call at the hospital. ° °For further questions, please visit [email or web address]  ° ° ° °  Post Anesthesia Home Care Instructions ° °Activity: °Get plenty of rest for the remainder of the day. A responsible adult should stay with you for 24 hours following the procedure.  °For the next 24 hours, DO NOT: °-Drive a car °-Operate machinery °-Drink alcoholic beverages °-Take any medication unless instructed by your physician °-Make any legal decisions or sign important papers. ° °Meals: °Start with liquid foods such as gelatin or soup. Progress to regular foods as tolerated. Avoid greasy, spicy, heavy foods. If nausea and/or vomiting occur, drink only clear liquids until the nausea and/or vomiting subsides. Call your physician if vomiting continues. ° °Special Instructions/Symptoms: °Your throat may feel dry or sore from the anesthesia or the breathing tube placed in your throat during surgery. If this causes discomfort, gargle with warm salt water. The discomfort should disappear within 24 hours. ° °If you had a scopolamine patch placed behind your ear for the management of post- operative nausea and/or vomiting: ° °1. The medication in the patch is effective for 72 hours, after which it should be removed.  Wrap patch in a tissue and discard in the trash. Wash hands thoroughly with soap and water. °2. You may remove the patch earlier than 72 hours if you experience unpleasant side effects which may include dry mouth, dizziness or visual disturbances. °3. Avoid touching the patch. Wash your hands with soap and water after contact with the patch. °  ° °

## 2015-10-11 NOTE — Op Note (Signed)
Preoperative Diagnosis:  cancer left breast     Postoprative Diagnosis:  cancer left breast     Procedure: Procedure(s): RADIOACTIVE SEED LOCALIZATION LEFT BREAST LUMPECTOMY WITH LEFT  AXILLARY SENTINEL LYMPH NODE BIOPSY   Surgeon: Excell Seltzer T   Assistants: none  Anesthesia:  General LMA anesthesia  Indications: patient is a 44 year old Hispanic female with a recent diagnosis of cancer of the upper inner left breast. She presented with pain and imaging revealed a 1.2 cm primary cancer but negative lymph nodes, biopsy showing ER/PR positive invasive ductal carcinoma. After discussion of treatment options and risks detailed extensively elsewhere we have elected to proceed with our SL localized left breast lumpectomy and axillary sentinel lymph node biopsy is initial surgical treatment for her breast cancer.    Procedure Detail:  The patient had undergone yesterday  placement of radioactive seed at the clip and tumor site. In the holding area today the cecum was confirmed with the neoprobe. She underwent a regional block by anesthesia. With IV sedation she underwent injection of 1 mCi of technetium sulfur colloid intradermally around the left nipple. She was then brought to the operating room, placed in supine position on the operating table, and laryngeal mask general anesthesia induced. She was carefully positioned with her left arm extended. I confirmed an area of high counts in the left axilla with the neoprobe. The entire left chest and breast, axilla, and upper arm were widely sterilely prepped and draped. She received preoperative IV antibiotics. PAS were in place. Patient timeout was performed and correct procedure verified. The lumpectomy was approached initially. I was able to feel a mass in the upper inner breast which felt somewhat larger than 1.2 cm. A curvilinear incision was made at the areolar border superiorly and skin and subcutaneous flap was raised using the [employer]  lighted retractor working superiorly and medially over the site of the palpable abnormality and the high counts confirmed with the neoprobe. I then performed a generous lumpectomy surrounding the area of high counts and palpable abnormality which extended down essentially to the chest wall in this area. The specimen was removed and inked for margins. Specimen mammogram showed the seton marking clip centrally located within the specimen. The wound was irrigated and hemostasis assured. I mobilized breast tissue off the chest wall medially and inferiorly to rotate into the defect on the breast and subcutaneous Tissue was closed with interrupted 3-0 Vicryl. Attention was turned to the sentinel lymph node biopsy. The hot area in the left axilla was localized and a transverse incision made in the skin crease. Dissection was carried down through the subcutaneous tenderness tissue and clavipectoral fascia using cautery. Using the neoprobe for guidance I dissected down onto 2 adjacent lymph nodes which were slightly enlarged and firm and these were excised each one with counts over 200 and sent as sentinel lymph nodes #1 and #2. A third area of high counts was isolated with careful blunt dissection and a smaller lymph node completely excised with counts of about 160. Background in the axilla this point was essentially nil and there were no other palpable abnormalities. Hemostasis was assured in the deep and subcutaneous tissue in this incision was closed with interrupted 3-0 Vicryl. The skin was closed with subcuticular 5-0 Monocryl in both incisions and Dermabond dressing applied. Sponge needle and instrument counts were correct.    Findings: As above  Estimated Blood Loss:  less than 50 mL         Drains: none  Blood Given: none          Specimens: #1 left breast lumpectomy   #2 left axillary sentinel lymph nodes X 3        Complications:  * No complications entered in OR log *         Disposition: PACU -  hemodynamically stable.         Condition: stable

## 2015-10-11 NOTE — Anesthesia Postprocedure Evaluation (Signed)
Anesthesia Post Note  Patient: Yolanda White  Procedure(s) Performed: Procedure(s) (LRB): RADIOACTIVE SEED LOCALIZATION LEFT BREAST LUMPECTOMY WITH LEFT  AXILLARY SENTINEL LYMPH NODE BIOPSY (Left)  Patient location during evaluation: PACU Anesthesia Type: General Level of consciousness: awake and alert Pain management: pain level controlled Vital Signs Assessment: post-procedure vital signs reviewed and stable Respiratory status: spontaneous breathing, nonlabored ventilation, respiratory function stable and patient connected to nasal cannula oxygen Cardiovascular status: blood pressure returned to baseline and stable Postop Assessment: no signs of nausea or vomiting Anesthetic complications: no    Last Vitals:  Filed Vitals:   10/11/15 1300 10/11/15 1310  BP: 137/70 130/75  Pulse: 67 68  Temp:    Resp: 16 15    Last Pain:  Filed Vitals:   10/11/15 1345  PainSc: 4                  Jessejames Steelman DAVID

## 2015-10-11 NOTE — Interval H&P Note (Signed)
History and Physical Interval Note:  10/11/2015 10:38 AM  [name]  has presented today for surgery, with the diagnosis of cancer left breast     The various methods of treatment have been discussed with the patient and family. After consideration of risks, benefits and other options for treatment, the patient has consented to  Procedure(s): RADIOACTIVE SEED LOCALIZATION LEFT BREAST LUMPECTOMY WITH LEFT  AXILLARY SENTINEL LYMPH NODE BIOPSY (Left) as a surgical intervention .  The patient's history has been reviewed, patient examined, no change in status, stable for surgery.  I have reviewed the patient's chart and labs.  Questions were answered to the patient's satisfaction.     [name]

## 2015-10-11 NOTE — Anesthesia Preprocedure Evaluation (Signed)
Anesthesia Evaluation  Patient identified by MRN, date of birth, ID band Patient awake    Reviewed: Allergy & Precautions, NPO status , Patient's Chart, lab work & pertinent test results  Airway Mallampati: I  TM Distance: >3 FB Neck ROM: Full    Dental   Pulmonary Current Smoker,    Pulmonary exam normal        Cardiovascular Normal cardiovascular exam     Neuro/Psych Anxiety Depression    GI/Hepatic   Endo/Other    Renal/GU      Musculoskeletal   Abdominal   Peds  Hematology   Anesthesia Other Findings   Reproductive/Obstetrics                             Anesthesia Physical Anesthesia Plan  ASA: II  Anesthesia Plan: General   Post-op Pain Management: MAC Combined w/ Regional for Post-op pain   Induction: Intravenous  Airway Management Planned: LMA  Additional Equipment:   Intra-op Plan:   Post-operative Plan: Extubation in OR  Informed Consent: I have reviewed the patients History and Physical, chart, labs and discussed the procedure including the risks, benefits and alternatives for the proposed anesthesia with the patient or authorized representative who has indicated his/her understanding and acceptance.     Plan Discussed with: CRNA and Surgeon  Anesthesia Plan Comments:         Anesthesia Quick Evaluation  

## 2015-10-11 NOTE — Progress Notes (Signed)
Assisted Dr. Ossey with left, ultrasound guided, pectoralis block. Side rails up, monitors on throughout procedure. See vital signs in flow sheet. Tolerated Procedure well. 

## 2015-10-12 ENCOUNTER — Encounter (HOSPITAL_BASED_OUTPATIENT_CLINIC_OR_DEPARTMENT_OTHER): Payer: Self-pay | Admitting: General Surgery

## 2015-10-12 NOTE — Addendum Note (Signed)
Addendum  created 10/12/15 0759 by Ernesta Amble Arseniy Toomey, CRNA   Modules edited: Charges VN

## 2015-10-16 ENCOUNTER — Encounter: Payer: Self-pay | Admitting: *Deleted

## 2015-10-16 NOTE — Progress Notes (Signed)
Ordered oncotype per Dr. Burr Medico.  Faxed requisition to pathology and confirmed receipt Christy.

## 2015-10-26 ENCOUNTER — Encounter (HOSPITAL_COMMUNITY): Payer: Self-pay

## 2015-10-26 ENCOUNTER — Telehealth: Payer: Self-pay | Admitting: *Deleted

## 2015-10-26 NOTE — Telephone Encounter (Signed)
Yolanda White with [facility] calling to confirm [phone] is the correct fax number.  We have Oncotype Dx information to send to Dr. Burr Medico.  Please call [phone]"

## 2015-10-31 ENCOUNTER — Encounter (HOSPITAL_COMMUNITY): Payer: Self-pay | Admitting: Emergency Medicine

## 2015-10-31 ENCOUNTER — Emergency Department (HOSPITAL_COMMUNITY)
Admission: EM | Admit: 2015-10-31 | Discharge: 2015-11-01 | Disposition: A | Discharge status: Home / Self Care | Payer: Medicaid Other | Attending: Emergency Medicine | Admitting: Emergency Medicine

## 2015-10-31 DIAGNOSIS — J45909 Unspecified asthma, uncomplicated: Secondary | ICD-10-CM | POA: Insufficient documentation

## 2015-10-31 DIAGNOSIS — R079 Chest pain, unspecified: Secondary | ICD-10-CM

## 2015-10-31 DIAGNOSIS — Z8659 Personal history of other mental and behavioral disorders: Secondary | ICD-10-CM | POA: Insufficient documentation

## 2015-10-31 DIAGNOSIS — L03313 Cellulitis of chest wall: Secondary | ICD-10-CM | POA: Diagnosis not present

## 2015-10-31 DIAGNOSIS — IMO0001 Reserved for inherently not codable concepts without codable children: Secondary | ICD-10-CM

## 2015-10-31 DIAGNOSIS — Y658 Other specified misadventures during surgical and medical care: Secondary | ICD-10-CM | POA: Insufficient documentation

## 2015-10-31 DIAGNOSIS — Z853 Personal history of malignant neoplasm of breast: Secondary | ICD-10-CM | POA: Insufficient documentation

## 2015-10-31 DIAGNOSIS — R300 Dysuria: Secondary | ICD-10-CM | POA: Insufficient documentation

## 2015-10-31 DIAGNOSIS — T814XXA Infection following a procedure, initial encounter: Secondary | ICD-10-CM | POA: Insufficient documentation

## 2015-10-31 DIAGNOSIS — Z79899 Other long term (current) drug therapy: Secondary | ICD-10-CM | POA: Insufficient documentation

## 2015-10-31 DIAGNOSIS — N63 Unspecified lump in breast: Secondary | ICD-10-CM | POA: Diagnosis not present

## 2015-10-31 DIAGNOSIS — R110 Nausea: Secondary | ICD-10-CM | POA: Diagnosis not present

## 2015-10-31 DIAGNOSIS — F17210 Nicotine dependence, cigarettes, uncomplicated: Secondary | ICD-10-CM | POA: Insufficient documentation

## 2015-10-31 NOTE — ED Notes (Signed)
Pt states that she had a lumpectomy on her L side on 12/15 and the swelling had since gone down but today it is more swollen, red and hurts when she lifts her arm or turns her head. Pt is not yet receiving chemo/radiation. Alert and oriented.

## 2015-10-31 NOTE — ED Provider Notes (Signed)
CSN: [id number]     Arrival date & time 10/31/15  2249 History  By signing my name below, I, Yolanda White, attest that this documentation has been prepared under the direction and in the presence of Yolanda Furry, MD. Electronically Signed: Altamease White, ED Scribe. 11/01/2015. 12:33 AM  Chief Complaint  Patient presents with  . Post-op Problem     The history is provided by the patient. No language interpreter was used.   Yolanda White is a 45 y.o. female with history of breast cancer s/p lumpectomy who presents to the Emergency Department complaining of constant, 8/10 in severity, pain and swelling at the left breast with onset last night.  The pain is worse with breathing and she states that it is difficult to turn her neck to the left side. Tylenol provided insufficient relief in pain at home. Pt had a lumpectomy on 10/12/15 by Dr. Excell Seltzer at the [facility].  She states that the swelling had resolved since the surgery but returned last night. Associated symptoms include nausea.She also complains of dysuria for 2 days. Pt denies fever, chills, and vomiting.    Past Medical History  Diagnosis Date  . Asthma   . Seasonal allergies   . Breast cancer of upper-inner quadrant of left female breast ([place]) 09/08/2015  . Breast cancer ([place])   . Anxiety   . Depression    Past Surgical History  Procedure Laterality Date  . Abdominal hysterectomy    . Tubal ligation    . Radioactive seed guided mastectomy with axillary sentinel lymph node biopsy Left 10/11/2015    Procedure: RADIOACTIVE SEED LOCALIZATION LEFT BREAST LUMPECTOMY WITH LEFT  AXILLARY SENTINEL LYMPH NODE BIOPSY;  Surgeon: Excell Seltzer, MD;  Location: [facility];  Service: General;  Laterality: Left;   Family History  Problem Relation Age of Onset  . Diabetes Mother   . Diabetes Father   . Lung cancer Father 16    smoker  . Cancer Paternal Aunt     cervical  . Diabetes Brother   . Heart attack  Paternal Grandmother     in her 77s  . Diabetes Brother     maternal half brother  . Asthma Brother   . Asthma Maternal Aunt    Social History  Substance Use Topics  . Smoking status: Current Every Day Smoker -- 0.50 packs/day for 34 years    Types: Cigarettes  . Smokeless tobacco: Never Used  . Alcohol Use: No   OB History    Gravida Para Term Preterm AB TAB SAB Ectopic Multiple Living   4 3 3  1     3      Review of Systems  Constitutional: Negative for fever, chills, diaphoresis, appetite change and fatigue.  HENT: Negative for mouth sores, sore throat and trouble swallowing.   Eyes: Negative for visual disturbance.  Respiratory: Negative for cough, chest tightness, shortness of breath and wheezing.   Cardiovascular: Negative for chest pain.  Gastrointestinal: Positive for nausea. Negative for vomiting, abdominal pain, diarrhea and abdominal distention.  Endocrine: Negative for polydipsia, polyphagia and polyuria.  Genitourinary: Positive for dysuria. Negative for frequency and hematuria.       Pain and swelling at the left breast  Musculoskeletal: Negative for gait problem.  Skin: Negative for color change, pallor and rash.  Neurological: Negative for dizziness, syncope, light-headedness and headaches.  Hematological: Does not bruise/bleed easily.  Psychiatric/Behavioral: Negative for behavioral problems and confusion.   Allergies  Review of patient's  allergies indicates no known allergies.  Home Medications   Prior to Admission medications   Medication Sig Start Date End Date Taking? Authorizing Provider  acetaminophen (TYLENOL) 500 MG tablet Take 1,000 mg by mouth every 6 (six) hours as needed for moderate pain.    Yes Historical Provider, MD  albuterol (PROVENTIL) (2.5 MG/3ML) 0.083% nebulizer solution Take 2.5 mg by nebulization every 6 (six) hours as needed for wheezing or shortness of breath.   Yes Historical Provider, MD  albuterol (VENTOLIN HFA) 108 (90 BASE)  MCG/ACT inhaler Inhale 2 puffs into the lungs every 6 (six) hours as needed for wheezing or shortness of breath.   Yes Historical Provider, MD  calcium carbonate (TUMS - DOSED IN MG ELEMENTAL CALCIUM) 500 MG chewable tablet Chew 1-2 tablets by mouth daily as needed for indigestion or heartburn.    Yes Historical Provider, MD  ranitidine (ZANTAC) 150 MG tablet Take 150 mg by mouth daily as needed for heartburn.   Yes Historical Provider, MD  cephALEXin (KEFLEX) 500 MG capsule Take 1 capsule (500 mg total) by mouth 3 (three) times daily. 11/01/15   Yolanda Furry, MD  HYDROcodone-acetaminophen (NORCO/VICODIN) 5-325 MG tablet Take 1-2 tablets by mouth every 4 (four) hours as needed for moderate pain or severe pain. Patient not taking: Reported on 10/31/2015 10/11/15   Excell Seltzer, MD  oxyCODONE-acetaminophen (PERCOCET/ROXICET) 5-325 MG tablet Take 2 tablets by mouth every 4 (four) hours as needed. 11/01/15   Yolanda Furry, MD   BP 107/97 mmHg  Pulse 83  Temp(Src) 98.2 F (36.8 C) (Oral)  Resp 16  Ht 5\' 2"  (1.575 m)  Wt 230 lb (104.327 kg)  BMI 42.06 kg/m2  SpO2 99% Physical Exam  Constitutional: She is oriented to person, place, and time. She appears well-developed and well-nourished. No distress.  HENT:  Head: Normocephalic.  Eyes: Conjunctivae are normal. Pupils are equal, round, and reactive to light. No scleral icterus.  Neck: Normal range of motion. Neck supple. No thyromegaly present.  Cardiovascular: Normal rate and regular rhythm.  Exam reveals no gallop and no friction rub.   No murmur heard. Pulmonary/Chest: Effort normal and breath sounds normal. No respiratory distress. She has no wheezes. She has no rales. She exhibits tenderness. She exhibits no crepitus. Left breast exhibits skin change.  Erythema circumferential about the areola  Tenderness throughout the anterior left chest and pectoralis No crepitus Areolar incision intact with surrounding erythema  Abdominal: Soft. Bowel sounds  are normal. She exhibits no distension. There is no tenderness. There is no rebound.  Musculoskeletal: Normal range of motion.  Neurological: She is alert and oriented to person, place, and time.  Skin: Skin is warm and dry. No rash noted.  Psychiatric: She has a normal mood and affect. Her behavior is normal.    ED Course  Procedures (including critical care time) DIAGNOSTIC STUDIES: Oxygen Saturation is 99% on RA,  normal by my interpretation.    COORDINATION OF CARE: 12:04 AM Discussed treatment plan which includes CXR, lab work, Zofran, and morphine with pt at bedside and pt agreed to plan.  Labs Review Labs Reviewed  CBC WITH DIFFERENTIAL/PLATELET - Abnormal; Notable for the following:    Hemoglobin 11.9 (*)    HCT 35.9 (*)    All other components within normal limits  BASIC METABOLIC PANEL - Abnormal; Notable for the following:    CO2 21 (*)    Glucose, Bld 108 (*)    Creatinine, Ser 1.01 (*)    All other  components within normal limits    Imaging Review Dg Chest 2 View  11/01/2015  CLINICAL DATA:  Acute onset of left breast swelling and pain. Status post recent left breast lumpectomy and lymph node resection. Initial encounter. EXAM: CHEST  2 VIEW COMPARISON:  Chest radiograph performed 08/08/2015 FINDINGS: The lungs are well-aerated and clear. There is no evidence of focal opacification, pleural effusion or pneumothorax. The heart is normal in size; the mediastinal contour is within normal limits. No acute osseous abnormalities are seen. IMPRESSION: No acute cardiopulmonary process seen. Electronically Signed   By: Garald Balding M.D.   On: 11/01/2015 00:45   I have personally reviewed and evaluated these images and lab results as part of my medical decision-making.   EKG Interpretation None      MDM   Final diagnoses:  Cellulitis of chest wall  Cellulitis, wound, post-operative, initial encounter   Patient overall appears well. X-ray shows no subcutaneous  tenderness air. He ran medically stable. Afebrile. Given IV antibiotics. No leukocytosis. Afebrile. I discussed the case briefly with Dr. Harlow Asa.  I described a nontoxic patient with a benign-appearing wound and a mild to moderate cellulitis. He was in agreement with antibiotics and close follow-up in the office.  I personally performed the services described in this documentation, which was scribed in my presence. The recorded information has been reviewed and is accurate.    Yolanda Furry, MD 11/01/15 [phone]

## 2015-11-01 ENCOUNTER — Emergency Department (HOSPITAL_COMMUNITY): Payer: Medicaid Other

## 2015-11-01 LAB — CBC WITH DIFFERENTIAL/PLATELET
BASOS PCT: 0 %
Basophils Absolute: 0 10*3/uL (ref 0.0–0.1)
EOS ABS: 0.4 10*3/uL (ref 0.0–0.7)
Eosinophils Relative: 4 %
HCT: 35.9 % — ABNORMAL LOW (ref 36.0–46.0)
HEMOGLOBIN: 11.9 g/dL — AB (ref 12.0–15.0)
Lymphocytes Relative: 26 %
Lymphs Abs: 2.6 10*3/uL (ref 0.7–4.0)
MCH: 27.9 pg (ref 26.0–34.0)
MCHC: 33.1 g/dL (ref 30.0–36.0)
MCV: 84.3 fL (ref 78.0–100.0)
Monocytes Absolute: 0.7 10*3/uL (ref 0.1–1.0)
Monocytes Relative: 7 %
NEUTROS PCT: 63 %
Neutro Abs: 6.2 10*3/uL (ref 1.7–7.7)
PLATELETS: 288 10*3/uL (ref 150–400)
RBC: 4.26 MIL/uL (ref 3.87–5.11)
RDW: 13 % (ref 11.5–15.5)
WBC: 10 10*3/uL (ref 4.0–10.5)

## 2015-11-01 LAB — BASIC METABOLIC PANEL
Anion gap: 11 (ref 5–15)
BUN: 14 mg/dL (ref 6–20)
CALCIUM: 9.1 mg/dL (ref 8.9–10.3)
CO2: 21 mmol/L — AB (ref 22–32)
CREATININE: 1.01 mg/dL — AB (ref 0.44–1.00)
Chloride: 107 mmol/L (ref 101–111)
GFR calc non Af Amer: >60 mL/min (ref >60)
Glucose, Bld: 108 mg/dL — ABNORMAL HIGH (ref 65–99)
Potassium: 4.3 mmol/L (ref 3.5–5.1)
SODIUM: 139 mmol/L (ref 135–145)

## 2015-11-01 MED ORDER — CEFAZOLIN SODIUM-DEXTROSE 2-3 GM-% IV SOLR
2.0000 g | Freq: Once | INTRAVENOUS | Status: AC
  Administered 2015-11-01: 2 g via INTRAVENOUS
  Filled 2015-11-01: qty 50

## 2015-11-01 MED ORDER — OXYCODONE-ACETAMINOPHEN 5-325 MG PO TABS: 2.0000 | ORAL_TABLET | ORAL | Status: DC | PRN

## 2015-11-01 MED ORDER — MORPHINE SULFATE (PF) 4 MG/ML IV SOLN
4.0000 mg | INTRAVENOUS | Status: DC | PRN
  Administered 2015-11-01 (×2): 4 mg via INTRAVENOUS
  Filled 2015-11-01 (×2): qty 1

## 2015-11-01 MED ORDER — CEPHALEXIN 500 MG PO CAPS: 500.0000 mg | ORAL_CAPSULE | Freq: Three times a day (TID) | ORAL | Status: DC

## 2015-11-01 MED ORDER — ONDANSETRON HCL 4 MG/2ML IJ SOLN
4.0000 mg | Freq: Once | INTRAMUSCULAR | Status: AC
  Administered 2015-11-01: 4 mg via INTRAVENOUS
  Filled 2015-11-01: qty 2

## 2015-11-01 NOTE — ED Notes (Signed)
Denies fevers but states she feels like she could break out in a sweat. Has taken tylenol but does not feel relief. Tenderness present left breast radiating up towards shoulder.

## 2015-11-01 NOTE — ED Notes (Signed)
Ambulated to bathroom

## 2015-11-01 NOTE — Discharge Instructions (Signed)
Call today for a follow-up appointment on Friday with Dr. Excell Seltzer   Cellulitis Cellulitis is an infection of the skin and the tissue under the skin. The infected area is usually red and tender. This happens most often in the arms and lower legs. HOME CARE   Take your antibiotic medicine as told. Finish the medicine even if you start to feel better.  Keep the infected arm or leg raised (elevated).  Put a warm cloth on the area up to 4 times per day.  Only take medicines as told by your doctor.  Keep all doctor visits as told. GET HELP IF:  You see red streaks on the skin coming from the infected area.  Your red area gets bigger or turns a dark color.  Your bone or joint under the infected area is painful after the skin heals.  Your infection comes back in the same area or different area.  You have a puffy (swollen) bump in the infected area.  You have new symptoms.  You have a fever. GET HELP RIGHT AWAY IF:   You feel very sleepy.  You throw up (vomit) or have watery poop (diarrhea).  You feel sick and have muscle aches and pains.   This information is not intended to replace advice given to you by your health care provider. Make sure you discuss any questions you have with your health care provider.   Document Released: 04/01/2008 Document Revised: 07/05/2015 Document Reviewed: 12/30/2011 Elsevier Interactive Patient Education 2016 [place].  Wound Infection A wound infection happens when a type of germ (bacteria) grows in a wound. Caring for the infection can help the wound heal. Wound infections need treatment. HOME CARE   Only take medicine as told by your doctor.  Take your antibiotic medicine as told. Finish it even if you start to feel better.  Clean the wound with mild soap and water as told. Rinse the soap off. Pat the area dry with a clean towel. Do not rub the wound.  Change any bandages (dressings) as told by your doctor.  Put cream and a  bandage on the wound as told by your doctor.  If the bandage sticks, wet it with soapy water to remove the bandage.  Change the bandage if it gets wet, dirty, or starts to smell.  Take showers. Do not take baths, swim, or do anything that puts your wound under water.  Avoid exercise that makes you sweat.  If your wound itches, use a medicine that helps stop itching. Do not pick or scratch at the wound.  Keep all doctor visits as told. GET HELP RIGHT AWAY IF:   You have more puffiness (swelling), pain, or redness around the wound.  You have more yellowish-white fluid (pus) coming from the wound.  You have a bad smell coming from the wound.  Your wound breaks open more.  You have a fever. MAKE SURE YOU:   Understand these instructions.  Will watch your condition.  Will get help right away if you are not doing well or get worse.   This information is not intended to replace advice given to you by your health care provider. Make sure you discuss any questions you have with your health care provider.   Document Released: 07/23/2008 Document Revised: 01/06/2012 Document Reviewed: 04/03/2015 Elsevier Interactive Patient Education [employer].

## 2015-11-02 ENCOUNTER — Encounter: Payer: Self-pay | Admitting: Hematology

## 2015-11-02 ENCOUNTER — Telehealth: Payer: Self-pay | Admitting: *Deleted

## 2015-11-02 NOTE — Progress Notes (Signed)
This encounter was created in error - please disregard.

## 2015-11-02 NOTE — Telephone Encounter (Signed)
Spoke with pt this am and informed pt that appt for today will be rescheduled to a later date due to oncotype testing results not available yet.  Pt voiced understanding.

## 2015-11-06 ENCOUNTER — Other Ambulatory Visit: Payer: Self-pay | Admitting: Hematology

## 2015-11-06 NOTE — Telephone Encounter (Signed)
Call Documentation      Thu Simon Rhein, RN at 11/02/2015 10:11 AM     Status: Signed       Expand All Collapse All   Spoke with pt this am and informed pt that appt for today will be rescheduled to a later date due to oncotype testing results not available yet. Pt voiced understanding.

## 2015-11-07 ENCOUNTER — Encounter (HOSPITAL_COMMUNITY): Payer: Self-pay

## 2015-11-07 ENCOUNTER — Telehealth: Payer: Self-pay | Admitting: Hematology

## 2015-11-07 ENCOUNTER — Encounter: Payer: Self-pay | Admitting: *Deleted

## 2015-11-07 NOTE — Telephone Encounter (Signed)
Called and left a message with appointments °

## 2015-11-16 ENCOUNTER — Telehealth: Payer: Self-pay | Admitting: Hematology

## 2015-11-16 ENCOUNTER — Encounter: Payer: Self-pay | Admitting: Hematology

## 2015-11-16 ENCOUNTER — Ambulatory Visit (HOSPITAL_BASED_OUTPATIENT_CLINIC_OR_DEPARTMENT_OTHER): Payer: Medicaid Other | Admitting: Hematology

## 2015-11-16 VITALS — BP 102/73 | HR 79 | Temp 97.7°F | Resp 18 | Ht 62.0 in | Wt 245.1 lb

## 2015-11-16 DIAGNOSIS — N63 Unspecified lump in breast: Secondary | ICD-10-CM | POA: Diagnosis not present

## 2015-11-16 DIAGNOSIS — Z720 Tobacco use: Secondary | ICD-10-CM

## 2015-11-16 DIAGNOSIS — E669 Obesity, unspecified: Secondary | ICD-10-CM | POA: Diagnosis not present

## 2015-11-16 DIAGNOSIS — F419 Anxiety disorder, unspecified: Secondary | ICD-10-CM

## 2015-11-16 DIAGNOSIS — Z170 Estrogen receptor positive status [ER+]: Secondary | ICD-10-CM | POA: Diagnosis not present

## 2015-11-16 DIAGNOSIS — C50212 Malignant neoplasm of upper-inner quadrant of left female breast: Secondary | ICD-10-CM

## 2015-11-16 DIAGNOSIS — J45909 Unspecified asthma, uncomplicated: Secondary | ICD-10-CM

## 2015-11-16 MED ORDER — OXYCODONE-ACETAMINOPHEN 5-325 MG PO TABS: 1.0000 | ORAL_TABLET | ORAL | Status: DC | PRN

## 2015-11-16 NOTE — Telephone Encounter (Signed)
Gv pt appt for 3/23.

## 2015-11-16 NOTE — Progress Notes (Signed)
**Note Yolanda-Identified via Obfuscation** [place]  [phone] [phone]  Clinic follow up Note   Patient Care Team: Provider Default, MD as PCP - General Excell Seltzer, MD as Consulting Physician (General Surgery) Truitt Merle, MD as Consulting Physician (Hematology) Thea Silversmith, MD as Consulting Physician (Radiation Oncology) 11/16/2015  CHIEF COMPLAINTS:  Follow up left breast cancer.  Oncology History   Breast cancer of upper-inner quadrant of left female breast [facility])   Staging form: Breast, AJCC 7th Edition     Clinical: Stage IA (T1c, N0, M0) - Signed by Truitt Merle, MD on 09/13/2015     Pathologic: No stage assigned - Unsigned       Breast cancer of upper-inner quadrant of left female breast (Yolanda White)   08/29/2015 Mammogram diagnostic mammo and US showed a 1.2X0.9X0.6cm mass in left breast 11:00 position    09/01/2015 Receptors her2 ER 90%+, PR 90%+, HER2-, Ki67 25%   09/01/2015 Initial Biopsy left breast biopsy showed invasive ductal carcinoma, G1   09/01/2015 Initial Diagnosis Breast cancer of upper-inner quadrant of left female breast ([place])   10/11/2015 Surgery left breast lumpectomy and SLN biopsy   10/11/2015 Pathology Results invasive ductal carcinoma, 3cm, LVI(-), margins negative, grade 1, 3 nodes (-)   10/11/2015 Oncotype testing RS 17 (low risk), which predicts 10 year risk of distant recurrence with tamoxifen alone 11%   HISTORY OF PRESENTING ILLNESS:  Yolanda White 45 y.o. female is here because of her newly diagnosed left breast cancer. She is accompanied by her 2 daughters and one sister to our multidisciplinary breast clinic today.  She has not seen a primary care physician for many years. She presented with intermittent left breast pain for one month, and she was seen in the emergency room on 08/08/2015. She was referred to breast imaging center and had a mammogram done on 08/29/2015. Which showed a 1.2 cm mass in the left breast 11:00 position. She underwent  core needle biopsy on 09/01/2015, which showed invasive ductal carcinoma, ER/PR stripe positive, HER-2 negative.  She has had lot of social and financial stress in the past year. She lives in a hotel now, her daughters live with her sister. She reports right-sided low back pain for the past few months, positional, she denies injury prior to that. She is also quite depressed and very anxious since the cancer diagnosis, she denies any significant dyspnea, GI symptoms or other new symptoms.  CURRENT THERAPY: pending adjuvant breast irradiation  INTERIM HISTORY: [name] returns for follow-up. She underwent left breast lumpectomy and sentinel lymph node biopsy on 10/11/2015. She initially did well after surgery, but developed a painful left breast lump 2 weeks after surgery, she went to emergency room on 10/31/2014, and was getting pain medication. No sign of infection. She called Dr. Hassell Done was office, and was given a follow-up appointment for 12/01/2015. She has run out of her pain medication,still has intermittent moderate to severe pain from the left breast lump, no fever or chills, no skin or nipple change, no other complaints.  MEDICAL HISTORY:  Past Medical History  Diagnosis Date  . Asthma   . Seasonal allergies   . Breast cancer of upper-inner quadrant of left female breast (Yolanda White) 09/08/2015  . Breast cancer (Yolanda White)   . Anxiety   . Depression     SURGICAL HISTORY: Past Surgical History  Procedure Laterality Date  . Abdominal hysterectomy    . Tubal ligation    . Radioactive seed guided mastectomy with axillary sentinel lymph node  biopsy Left 10/11/2015    Procedure: RADIOACTIVE SEED LOCALIZATION LEFT BREAST LUMPECTOMY WITH LEFT  AXILLARY SENTINEL LYMPH NODE BIOPSY;  Surgeon: Excell Seltzer, MD;  Location: [place];  Service: General;  Laterality: Left;   GYN HISTORY  Menarchal: 11 LMP: Hysterectomy in 2010  Contraceptive: she was on for one year , stopped 23 years  ago  HRT: n/a G4  P3: she was 73 with her first child, (+) breast feeding    SOCIAL HISTORY: Social History   Social History  . Marital Status: Single    Spouse Name: N/A  . Number of Children: 3 daughters age 44-25  . Years of Education: N/A   Occupational History  . Not on file.   Social History Main Topics  . Smoking status: Current Every Day Smoker -- 0.30 packs/day    Types: Cigarettes  . Smokeless tobacco: Not on file  . Alcohol Use: No  . Drug Use: No  . Sexual Activity: Yes    Birth Control/ Protection: Surgical   Other Topics Concern  . Not on file   Social History Narrative    FAMILY HISTORY: Family History  Problem Relation Age of Onset  . Diabetes Mother   . Diabetes Father   . Lung cancer Father 37    smoker  . Cancer Paternal Aunt     cervical  . Diabetes Brother   . Heart attack Paternal Grandmother     in her 48s  . Diabetes Brother     maternal half brother  . Asthma Brother   . Asthma Maternal Aunt     ALLERGIES:  has No Known Allergies.  MEDICATIONS:  Current Outpatient Prescriptions  Medication Sig Dispense Refill  . acetaminophen (TYLENOL) 500 MG tablet Take 1,000 mg by mouth every 6 (six) hours as needed for moderate pain.     Marland Kitchen albuterol (PROVENTIL) (2.5 MG/3ML) 0.083% nebulizer solution Take 2.5 mg by nebulization every 6 (six) hours as needed for wheezing or shortness of breath.    . calcium carbonate (TUMS - DOSED IN MG ELEMENTAL CALCIUM) 500 MG chewable tablet Chew 1-2 tablets by mouth daily as needed for indigestion or heartburn.     . ranitidine (ZANTAC) 150 MG tablet Take 150 mg by mouth daily as needed for heartburn.    Marland Kitchen albuterol (VENTOLIN HFA) 108 (90 BASE) MCG/ACT inhaler Inhale 2 puffs into the lungs every 6 (six) hours as needed for wheezing or shortness of breath. Reported on 11/16/2015    . HYDROcodone-acetaminophen (NORCO/VICODIN) 5-325 MG tablet Take 1-2 tablets by mouth every 4 (four) hours as needed for moderate  pain or severe pain. (Patient not taking: Reported on 10/31/2015) 40 tablet 0  . oxyCODONE-acetaminophen (PERCOCET/ROXICET) 5-325 MG tablet Take 1-2 tablets by mouth every 4 (four) hours as needed. (Patient not taking: Reported on 11/16/2015) 30 tablet 0   No current facility-administered medications for this visit.    REVIEW OF SYSTEMS:   Constitutional: Denies fevers, chills or abnormal night sweats Eyes: Denies blurriness of vision, double vision or watery eyes Ears, nose, mouth, throat, and face: Denies mucositis or sore throat Respiratory: Denies cough, dyspnea or wheezes Cardiovascular: Denies palpitation, chest discomfort or lower extremity swelling Gastrointestinal:  Denies nausea, heartburn or change in bowel habits Skin: Denies abnormal skin rashes Lymphatics: Denies new lymphadenopathy or easy bruising Neurological:Denies numbness, tingling or new weaknesses Behavioral/Psych: Mood is stable, no new changes  All other systems were reviewed with the patient and are negative.  PHYSICAL EXAMINATION:  ECOG PERFORMANCE STATUS: 1 - Symptomatic but completely ambulatory  Filed Vitals:   11/16/15 0951  BP: 102/73  Pulse: 79  Temp: 97.7 F (36.5 C)  Resp: 18   Filed Weights   11/16/15 0951  Weight: 245 lb 1.6 oz (111.177 kg)    GENERAL:alert, no distress and comfortable SKIN: skin color, texture, turgor are normal, no rashes or significant lesions EYES: normal, conjunctiva are pink and non-injected, sclera clear OROPHARYNX:no exudate, no erythema and lips, buccal mucosa, and tongue normal  NECK: supple, thyroid normal size, non-tender, without nodularity LYMPH:  no palpable lymphadenopathy in the cervical, axillary or inguinal LUNGS: clear to auscultation and percussion with normal breathing effort HEART: regular rate & rhythm and no murmurs and no lower extremity edema ABDOMEN:abdomen soft, non-tender and normal bowel sounds Musculoskeletal:no cyanosis of digits and no  clubbing  PSYCH: alert & oriented x 3 with fluent speech NEURO: no focal motor/sensory deficits Breasts: Breast inspection showed them to be symmetrical with no nipple discharge. The surgical incision above the left breast nipple is well healed, no skin erythema or discharge. There is a 5 x 5 cm subcutaneous lump in the upper inner quadrant of the left breast, above the incision, firm and tender.  Palpation of the right breasts and axilla revealed no obvious mass that I could appreciate.   LABORATORY DATA:  I have reviewed the data as listed Lab Results  Component Value Date   WBC 10.0 11/01/2015   HGB 11.9* 11/01/2015   HCT 35.9* 11/01/2015   MCV 84.3 11/01/2015   PLT 288 11/01/2015    Recent Labs  08/08/15 0304 09/13/15 0752 11/01/15 0033  NA 137 139 139  K 3.7 4.4 4.3  CL 104  --  107  CO2 25 24 21*  GLUCOSE 105* 85 108*  BUN 9 16.8 14  CREATININE 1.05* 1.3* 1.01*  CALCIUM 9.2 9.5 9.1  GFRNONAA >60  --  >60  GFRAA >60  --  >60  PROT  --  7.3  --   ALBUMIN  --  3.7  --   AST  --  10  --   ALT  --  10  --   ALKPHOS  --  101  --   BILITOT  --  0.64  --    PATHOLOGY REPORT: Diagnosis 10/11/2015  1. Breast, lumpectomy, Left - INVASIVE DUCTAL CARCINOMA, SEE COMMENT. - INVASIVE TUMOR IS 2 MM FROM THE NEAREST MARGIN (SUPERIOR). - PREVIOUS BIOPSY SITE - SEE TUMOR SYNOPTIC TEMPLATE BELOW 2. Lymph node, sentinel, biopsy, Left axillary - ONE LYMPH NODE, NEGATIVE FOR TUMOR (0/1). - PLEASE SEE COMMENT. 3. Lymph node, sentinel, biopsy, Left axillary #2 - ONE LYMPH NODE, NEGATIVE FOR TUMOR (0/1). - PLEASE SEE COMMENT. 4. Lymph node, sentinel, biopsy, Left axillary #3 - ONE LYMPH NODE, NEGATIVE FOR TUMOR (0/1) - PLEASE SEE COMMENT. Microscopic Comment 1. BREAST, INVASIVE TUMOR, WITH LYMPH NODES PRESENT Specimen, including laterality and lymph node sampling (sentinel, non-sentinel): Left breast with sentinel lymph node sampling. Procedure: Lumpectomy. Histologic type:  Ductal Grade: 1 of 3 Tubule formation: 2 Nuclear pleomorphism: 2 Mitotic:1 Tumor size (gross measurement): 3.0 cm Margins: Invasive, distance to closest margin: 2 mm (superior) In-situ, distance to closest margin: N/A If margin positive, focally or broadly: N/A Lymphovascular invasion: Absent. Ductal carcinoma in situ: Absent. Grade: N/A Extensive intraductal component: N/A Lobular neoplasia: Absent. Tumor focality: Unifocal, see comment. Treatment effect: None If present, treatment effect in breast tissue, lymph nodes or both: N/A Extent of  tumor: Skin: N/A Nipple: N/A Skeletal muscle: N/A Lymph nodes: Examined: 3 Sentinel 0 Non-sentinel 3 Total Lymph nodes with metastasis: 0 Isolated tumor cells (< 0.2 mm): N/A Micrometastasis: (> 0.2 mm and < 2.0 mm): N/A Macrometastasis: (> 2.0 mm): N/A Extracapsular extension: N/A Breast prognostic profile: Estrogen receptor: Not repeated, previous study demonstrated 90% positivity ([id number]) Progesterone receptor: Not repeated, previous study demonstrated 90% positivity ([id number]) Her 2 neu: Repeated previous study demonstrated no amplification (1.27) ([id number]) Ki-67: Not repeated previous study demonstrated 25% proliferation rate ([id number]) Non-neoplastic breast: Previous biopsy site tissue changes. TNM: pT2, pN0, pMX Comments: Representative sections from the 3.0 cm stellate lesion with associated ribbon shaped clip demonstrate diagnostic features of invasive ductal carcinoma. Within a representative section from firm tissue away from the grossly identified primary mass, there is invasive ductal caricnoma with extensive tissue fibrosis that is 2 mm fromtransfusion the nearest superior margin (slide 1H). Given that invasive tumor is infiltrating within grossly inapparent fibrous tissue, the invasive tumor is slide 1H is consider to represent grossly inapparent expansion of the primary 3.0cm mass and not multifocal  disease. As such, the invasive tumor is considered to be at least 3.0cm in size. (CRR:gt, 10/13/15) 2. 3, and 4. There are no intranodal malignant metastatic tumor deposits identified on routine histology or with cytokeratin AE1/3 immunostains.   Results: HER2 - NEGATIVE RATIO OF HER2/CEP17 SIGNALS 1.18 AVERAGE HER2 COPY NUMBER PER CELL 2.30  ONCOTYPE DX RS 17 (low risk), which predicts 10 year risk of distant recurrence with tamoxifen alone 11%.  RADIOGRAPHIC STUDIES: I have personally reviewed the radiological images as listed and agreed with the findings in the report.  Mm Diag Breast Tomo Bilateral and [place]   08/29/2015  ADDENDUM REPORT: 08/29/2015 08:40 ADDENDUM: The left axilla was not evaluated during this exam. Left axillary ultrasound may be performed at the time of biopsy. Electronically Signed   By: Franki Cabot M.D.   On: 08/29/2015 08:40  08/29/2015  CLINICAL DATA:  Left breast lump EXAM: DIGITAL DIAGNOSTIC BILATERAL MAMMOGRAM WITH 3D TOMOSYNTHESIS WITH CAD ULTRASOUND LEFT BREAST COMPARISON:  Previous exam(s). ACR Breast Density Category b: There are scattered areas of fibroglandular density. FINDINGS: Bilateral CC and MLO views were obtained with 3D tomosynthesis. Additional spot compression view, with tomosynthesis, performed at the site of the palpable abnormality in the upper left breast which is demarcated with an overlying skin marker. There is architectural distortion with upper inner quadrant of the left breast, 11 o'clock axis region, corresponding to the site of palpable abnormality. There are no dominant masses, suspicious calcifications or secondary signs of malignancy within the right breast. Mammographic images were processed with CAD. On physical exam, skin retraction noted within the upper left breast. Targeted ultrasound is performed, showing an irregular shadowing mass within the left breast at the 11 o'clock axis, 7 cm from the nipple, measuring 1.2 x 0.9 x 0.6 cm,  corresponding to the site of palpable abnormality and mammographic finding. IMPRESSION: 1. Irregular shadowing mass within the left breast at the 11 o'clock axis, 7 cm from the nipple, measuring 1.2 x 0.9 x 0.6 cm, corresponding to the site of palpable abnormality and skin retraction, also corresponding to the area of architectural distortion on mammogram. This is a highly suspicious finding for which ultrasound-guided biopsy is recommended. 2.  No evidence of malignancy within the right breast. RECOMMENDATION: Ultrasound-guided biopsy for the irregular shadowing mass in the left breast at the 11 o'clock axis, 7 cm from the nipple, measuring 1.2  x 0.9 x 0.6 cm. Postprocedure mammogram to ensure mammographic and sonographic correlation. Ultrasound-guided biopsy is scheduled for November 4th at 8 a.m. I have discussed the findings and recommendations with the patient. Results were also provided in writing at the conclusion of the visit. If applicable, a reminder letter will be sent to the patient regarding the next appointment. BI-RADS CATEGORY  5: Highly suggestive of malignancy. Electronically Signed: By: Franki Cabot M.D. On: 08/25/2015 13:59     ASSESSMENT & PLAN: 45 year old female with past medical history of asthma, obesity, anxiety and depression, presented with left breast pain.  1. 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 -I discussed her Oncotype DX genomic test result. She has low risk recurrence score 17, which predicts of 11% 10 year risk of distant recurrence with tamoxifen alone. She will not benefit from adjuvant chemotherapy. -Giving the strong ER and PR expression in her tumor and premenopausal status, I recommend adjuvant endocrine therapy with tamoxifen for a total of 10 years to reduce the risk of  cancer recurrence. -She was also seen by radiation oncologist Dr. Pablo Ledger before, will see her back next week for adjuvant irradiation.   2. Left breast lump, likely seroma -I reviewed her pain medication Percocet today -I sent a message to Dr. Excell Seltzer to see if he is able to see her back earlier  3. Genetics -She is a very young, we recommended genetic testing for inheritable breast/ovarian cancer syndrome. She was tested for [facility] gene panel which was negative.  4. Asthma, anxiety and obesity  -She does not have a primary care physician, I strongly encouraged her to establish her care with her primary care physician.   Plan -no adjuvant chemotherapy -She will follow-up with Dr. Excell Seltzer for left breast seroma  -she will see Dr. Pablo Ledger for breast adjuvant radiation -I'll see her back in 2 months, or the last week of her radiation.  All questions were answered. The patient knows to call the clinic with any problems, questions or concerns. I spent 25 minutes counseling the patient face to face. The total time spent in the appointment was 30 minutes and more than 50% was on counseling.     Truitt Merle, MD 11/16/2015 12:19 PM

## 2015-11-20 NOTE — Progress Notes (Signed)
Location of Breast Cancer:1. Breast Left, Left axillary  10-11-15 - One Lymph Node, Negative For Tumor Histology per Pathology Report:09-1415 Breast, lumpectomy, Left   - INVASIVE DUCTAL CARCINOMA, SEE COMMENT. - INVASIVE TUMOR IS 2 MM FROM THE NEAREST MARGIN (SUPERIOR). - PREVIOUS BIOPSY SITE - SEE TUMOR SYNOPTIC TEMPLATE BELOW 2. Lymph node, sentinel, biopsy, Left axillary - ONE LYMPH NODE, NEGATIVE FOR TUMOR (0/1). - PLEASE SEE COMMENT. 3. Lymph node, sentinel, biopsy, Left axillary #2 - ONE LYMPH NODE, NEGATIVE FOR TUMOR (0/1)..  Receptor Status: ER(90%), PR (90%), Her2-neu (negative)  KI-67 (25%)  Yolanda White presented with intermittent left breast pain for one month, and she was seen in the emergency room on 08/08/2015. She was referred to breast imaging center and had a mammogram done on 08/29/2015. Which showed a 1.2 cm mass in the left breast 11:00 position. She underwent core needle biopsy on 09/01/2015, which showed invasive ductal carcinoma, ER/PR stripe positive, HER-2 negative.  Past/Anticipated interventions by surgeon, if any: 10-11-15 Left breast lumpectomy and SLN biopsy  Past/Anticipated interventions by medical oncology, if any: Oncotype testing with Tamoxifen                                                                                                 Pain issues, if any: Left breast level 3,"States she has fluid in her left breast  Dr. Burr Medico said that it needs to be removed arranging for her to see Dr. Excell Seltzer before the 11-30-15 scheduled appointment.  SAFETY ISSUES:  Prior radiation; No  Pacemaker/ICD: No   Possible current pregnancy:No  Is the patient on methotrexate:No  Current Complaints / other details: Has fluid in left breast saw Dr. Burr Medico 11-16-15 she the fluid  said that it needs to be removed arranging a sooner appointment with Dr. Excell Seltzer before the 11-30-15 scheduled appointment.       GYN HISTORY :  Menarchal: 11 LMP: Hysterectomy in  2010  Contraceptive: she was on for one year , stopped 23 years ago  HRT: n/a G4 P3: she was 45 with her first child, (+) breast feeding  BP 115/72 mmHg  Pulse 96  Temp(Src) 98.6 F (37 C) (Oral)  Ht _0  (1.575 m)  Wt 240 lb 14.4 oz (109.272 kg)  BMI 44.05 kg/m2  SpO2 100%  Wt Readings from Last 3 Encounters:  11/22/15 240 lb 14.4 oz (109.272 kg)  11/16/15 245 lb 1.6 oz (111.177 kg)  10/31/15 230 lb (104.327 kg)   Georgena Spurling, RN 11/20/2015,4:27 PM

## 2015-11-22 ENCOUNTER — Encounter: Payer: Self-pay | Admitting: *Deleted

## 2015-11-22 ENCOUNTER — Ambulatory Visit
Admission: RE | Admit: 2015-11-22 | Discharge: 2015-11-22 | Disposition: A | Discharge status: Home / Self Care | Payer: Medicaid Other | Source: Ambulatory Visit | Attending: Radiation Oncology | Admitting: Radiation Oncology

## 2015-11-22 ENCOUNTER — Encounter: Payer: Self-pay | Admitting: Radiation Oncology

## 2015-11-22 VITALS — BP 115/72 | HR 96 | Temp 98.6°F | Ht 62.0 in | Wt 240.9 lb

## 2015-11-22 DIAGNOSIS — C50212 Malignant neoplasm of upper-inner quadrant of left female breast: Secondary | ICD-10-CM | POA: Diagnosis present

## 2015-11-22 DIAGNOSIS — Z170 Estrogen receptor positive status [ER+]: Secondary | ICD-10-CM | POA: Insufficient documentation

## 2015-11-22 DIAGNOSIS — Z510 Encounter for antineoplastic radiation therapy: Secondary | ICD-10-CM | POA: Diagnosis present

## 2015-11-22 NOTE — Progress Notes (Signed)
  Radiation Oncology         [phone] ________________________________  Initial outpatient Consultation - Date: 11/22/2015   Name: Yolanda White MRN: [id number]   DOB: 28-Oct-1971  REFERRING PHYSICIAN: Excell Seltzer, MD  DIAGNOSIS AND STAGE: Breast cancer of upper-inner quadrant of left female breast [facility])   Staging form: Breast, AJCC 7th Edition     Clinical: Stage IA (TIc, N0, M0) - Signed by Truitt Merle, MD on 09/13/2015     Pathologic stage from 10/13/2015: Stage IIA (T2, N0, cM0) - Unsigned       Staging comments: Staged on surgical specimen by Dr. Donato Heinz    HISTORY OF PRESENT ILLNESS::Yolanda White is a 45 y.o. female who had her lumpectomy which revealed 3 cm invasive ductal carcinoma, Grade I. Margins were negative. Oncotype was low risk. She will not need chemotherapy. She is therefore ready to begin radiation. She has been struggling with pain since her surgery.   Ms. Epting is here alone today. Reports that two days ago her entire arm was in pain. States that Dr. Burr Medico believed her surgical site might need to be drained. She missed her post op appointment with Dr. Excell Seltzer and was rescheduled to follow-up on 11/30/15. She is scheduled for a follow-up appointment with Dr. Burr Medico on 01/18/16.   PREVIOUS RADIATION THERAPY: No  Past medical, social and family history were reviewed in the electronic chart. Review of symptoms was reviewed in the electronic chart. Medications were reviewed in the electronic chart.   PHYSICAL EXAM:  Filed Vitals:   11/22/15 1427  BP: 115/72  Pulse: 96  Temp: 98.6 F (37 C)  .240 lb 14.4 oz (109.272 kg).   General: Alert and oriented.  Breast: Breast exam deferred.  IMPRESSION: Stage IIA (T2, N0, cM0) Breast cancer of upper-inner quadrant of left female breast  PLAN: I spoke to the patient today regarding her diagnosis and options for treatment. We discussed the equivalence in terms of survival and local failure between mastectomy and  breast conservation. We discussed the role of radiation in decreasing local failures in patients who undergo lumpectomy. We discussed the process of simulation and the placement tattoos. We discussed 4-6 weeks of treatment as an outpatient. We discussed the possibility of asymptomatic lung damage. We discussed the low likelihood of secondary malignancies. We discussed the possible side effects including but not limited to skin redness, fatigue, permanent skin darkening, and breast swelling.  We discussed the use of cardiac sparing with deep inspiration breath hold if needed.   The patient met with our social worker, Loren Racer, to discuss transportation and financial assistance today. The patient has been scheduled for simulation and treatment planning on Thursday, 2/9, at 9 am. She is scheduled for a follow-up appointment with Dr. Burr Medico on 01/18/16.  I spent 40 minutes  face to face with the patient and more than 50% of that time was spent in counseling and/or coordination of care.   ------------------------------------------------  Thea Silversmith, MD  This document serves as a record of services personally performed by Thea Silversmith, MD. It was created on her behalf by Arlyce Harman, a trained medical scribe. The creation of this record is based on the scribe's personal observations and the provider's statements to them. This document has been checked and approved by the attending provider.
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
## 2015-11-23 ENCOUNTER — Encounter: Payer: Self-pay | Admitting: *Deleted

## 2015-11-23 NOTE — Addendum Note (Signed)
Encounter addended by: Malena Edman, RN on: 11/23/2015  5:21 PM<BR>     Documentation filed: Charges VN

## 2015-11-23 NOTE — Progress Notes (Signed)
[place] Work  Clinical Social Work was contacted by pt to follow up after meeting with pt yesterday. Pt eager to complete ADRs and CSW explained the process. Pt plans to reach out to CSW when she plans to return and CSW to assist accordingly.   Clinical Social Work interventions: ADR education  Loren Racer, [place] Worker [place]  [place] Phone: [phone] Fax: [phone]

## 2015-11-24 NOTE — Care Management Important Message (Signed)
Important Message  Patient Details  Name: Yolanda White MRN: [id number] Date of Birth: Apr 19, 1971   Medicare Important Message Given:  Yes    Nikolaj Geraghty [name] 11/24/2015, 11:18 AM

## 2015-11-24 NOTE — Care Management Important Message (Signed)
Important Message  Patient Details  Name: Yolanda White MRN: [id number] Date of Birth: 11/06/1970   Medicare Important Message Given:  Yes    [name] [name] 11/24/2015, 11:17 AM

## 2015-12-07 ENCOUNTER — Ambulatory Visit
Admission: RE | Admit: 2015-12-07 | Discharge: 2015-12-07 | Disposition: A | Discharge status: Home / Self Care | Payer: Medicaid Other | Source: Ambulatory Visit | Attending: Radiation Oncology | Admitting: Radiation Oncology

## 2015-12-07 ENCOUNTER — Encounter: Payer: Self-pay | Admitting: *Deleted

## 2015-12-07 DIAGNOSIS — C50212 Malignant neoplasm of upper-inner quadrant of left female breast: Secondary | ICD-10-CM

## 2015-12-07 DIAGNOSIS — Z510 Encounter for antineoplastic radiation therapy: Secondary | ICD-10-CM | POA: Diagnosis not present

## 2015-12-07 NOTE — Progress Notes (Signed)
Name: Yolanda White   MRN: [id number]  Date:  12/07/2015  DOB: 1971-01-30  Status:outpatient   DIAGNOSIS: Left Breast cancer.  CONSENT VERIFIED: yes SET UP: Patient is setup supine  IMMOBILIZATION:  The following immobilization was used:Custom Moldable Pillow, breast board.  NARRATIVE: [name] was brought to the [facility].  Identity was confirmed.  All relevant records and images related to the planned course of therapy were reviewed.  Then, the patient was positioned in a stable reproducible clinical set-up for radiation therapy.  Wires were placed to delineate the clinical extent of breast tissue. A wire was placed on the scar as well.  CT images were obtained.  An isocenter was placed. Skin markings were placed.  The position of the heart was then analyzed.  I do not feel she will benefit from breath hold. The CT images were loaded into the planning software where the target and avoidance structures were contoured.  The radiation prescription was entered and confirmed. The patient was discharged in stable condition and tolerated simulation well.    TREATMENT PLANNING NOTE/3D Simulation Note Treatment planning then occurred. I have requested : MLC's, isodose plan, basic dose calculation  3D simulation was performed.  I personally designed and supervised the construction of 3 medically necessary complex treatment devices in the form of MLCs which will be used for beam modification and to protect critical structures including the heart and lung as well as the immobilization device which is necessary for reproducible set up.  I have requested a dose volume histogram of the heart, lung and tumor cavity.     This document serves as a record of services personally performed by Thea Silversmith, MD. It was created on her behalf by Jenell Milliner, a trained medical scribe. The creation of this record is based on the scribe's personal observations and the provider's statements to  them. This document has been checked and approved by the attending provider.

## 2015-12-07 NOTE — Progress Notes (Signed)
[place] Work  Clinical Social Work was referred by Pension scheme manager for re-assessment of psychosocial needs.  Clinical Social Worker met with patient at [facility] after her SIM appt to check in, support and assess for needs.  Pt reports she did connect with counseling interns, but needs to reschedule her appt with them. Pt meeting with fin counselor today to further discuss transportation assistance. Pt was approved for SCAT, but would prefer to receive gas cards during radiation. Pt aware of CSW for counseling as well and plans to reschedule with interns once radiation begins.   Loren Racer, [place] Worker [place]  [place] Phone: [phone] Fax: [phone]

## 2015-12-07 NOTE — Progress Notes (Signed)
Radiation Oncology         [phone] ________________________________  Name: Yolanda White      MRN: [id number]          Date: 12/07/2015              DOB: 07/27/1971  Optical Surface Tracking Plan:  Since intensity modulated radiotherapy (IMRT) and 3D conformal radiation treatment methods are predicated on accurate and precise positioning for treatment, intrafraction motion monitoring is medically necessary to ensure accurate and safe treatment delivery.  The ability to quantify intrafraction motion without excessive ionizing radiation dose can only be performed with optical surface tracking. Accordingly, surface imaging offers the opportunity to obtain 3D measurements of patient position throughout IMRT and 3D treatments without excessive radiation exposure.  I am ordering optical surface tracking for this patient's upcoming course of radiotherapy. ________________________________ Signature   Reference:   Ursula Alert, J, et al. Surface imaging-based analysis of intrafraction motion for breast radiotherapy patients.Journal of [place], n. 6, nov. 2014. ISSN [id number].   Available at: <http://www.jacmp.org/index.php/jacmp/article/view/4957>.

## 2015-12-08 ENCOUNTER — Other Ambulatory Visit: Payer: Self-pay | Admitting: *Deleted

## 2015-12-11 ENCOUNTER — Telehealth: Payer: Self-pay

## 2015-12-11 NOTE — Telephone Encounter (Signed)
Called and left a message with new follow up per pof  Yolanda White

## 2015-12-12 DIAGNOSIS — Z510 Encounter for antineoplastic radiation therapy: Secondary | ICD-10-CM | POA: Diagnosis not present

## 2015-12-13 DIAGNOSIS — Z510 Encounter for antineoplastic radiation therapy: Secondary | ICD-10-CM | POA: Diagnosis not present

## 2015-12-14 ENCOUNTER — Ambulatory Visit
Admission: RE | Admit: 2015-12-14 | Discharge: 2015-12-14 | Disposition: A | Discharge status: Home / Self Care | Payer: Medicaid Other | Source: Ambulatory Visit | Attending: Radiation Oncology | Admitting: Radiation Oncology

## 2015-12-14 DIAGNOSIS — Z510 Encounter for antineoplastic radiation therapy: Secondary | ICD-10-CM | POA: Diagnosis not present

## 2015-12-18 ENCOUNTER — Ambulatory Visit
Admission: RE | Admit: 2015-12-18 | Discharge: 2015-12-18 | Disposition: A | Discharge status: Home / Self Care | Payer: Medicaid Other | Source: Ambulatory Visit | Attending: Radiation Oncology | Admitting: Radiation Oncology

## 2015-12-18 DIAGNOSIS — Z510 Encounter for antineoplastic radiation therapy: Secondary | ICD-10-CM | POA: Diagnosis not present

## 2015-12-19 ENCOUNTER — Ambulatory Visit
Admission: RE | Admit: 2015-12-19 | Discharge: 2015-12-19 | Disposition: A | Discharge status: Home / Self Care | Payer: Medicaid Other | Source: Ambulatory Visit | Attending: Radiation Oncology | Admitting: Radiation Oncology

## 2015-12-19 ENCOUNTER — Encounter: Payer: Self-pay | Admitting: Radiation Oncology

## 2015-12-19 VITALS — BP 103/66 | HR 86 | Temp 98.1°F | Ht 62.0 in | Wt 246.1 lb

## 2015-12-19 DIAGNOSIS — Z510 Encounter for antineoplastic radiation therapy: Secondary | ICD-10-CM | POA: Diagnosis not present

## 2015-12-19 DIAGNOSIS — C50212 Malignant neoplasm of upper-inner quadrant of left female breast: Secondary | ICD-10-CM

## 2015-12-19 NOTE — Progress Notes (Signed)
Ms. Hindi has received 2 fractions to her left breast.  Denies pain to her left breast today.  Skin to left breast without change in color.  Radiaplex gel and deodorant given today with educational instructions.  Appetite varies eats some days other days does not eat much.  Energy level is low, able to wait her grand-daughter.   BP 103/66 mmHg  Pulse 86  Temp(Src) 98.1 F (36.7 C) (Oral)  Ht 5\' 2"  (1.575 m)  Wt 246 lb 1.6 oz (111.63 kg)  BMI 45.00 kg/m2  SpO2 100%

## 2015-12-19 NOTE — Progress Notes (Signed)
Weekly Management Note Current Dose:  3.6 Gy  Projected Dose: 61 Gy   Narrative:  The patient presents for routine under treatment assessment.  CBCT/MVCT images/Port film x-rays were reviewed.  The chart was checked. Pt reports metallic tast, breast pain and fatigue after treatment yesterday. Less aptience with her grandchildren.  Overwhlemed. Started on sertaline by PCP.    Physical Findings: Weight: 246 lb 1.6 oz (111.63 kg). Unchanged  Impression:  The patient is tolerating radiation.  Plan:  Continue treatment as planned. Pt has many stressors outside of cancer. Would lek to get back to work so she can get her own place. Will ask social work to check in for support. Discussed ramifications of quitting treatment.

## 2015-12-20 ENCOUNTER — Ambulatory Visit
Admission: RE | Admit: 2015-12-20 | Discharge: 2015-12-20 | Disposition: A | Discharge status: Home / Self Care | Payer: Medicaid Other | Source: Ambulatory Visit | Attending: Radiation Oncology | Admitting: Radiation Oncology

## 2015-12-20 DIAGNOSIS — Z510 Encounter for antineoplastic radiation therapy: Secondary | ICD-10-CM | POA: Diagnosis not present

## 2015-12-20 DIAGNOSIS — C50212 Malignant neoplasm of upper-inner quadrant of left female breast: Secondary | ICD-10-CM

## 2015-12-20 MED ORDER — RADIAPLEXRX EX GEL
Freq: Once | CUTANEOUS | Status: AC
Start: 2015-12-20 — End: 2015-12-20
  Administered 2015-12-20: 14:00:00 via TOPICAL

## 2015-12-20 MED ORDER — ALRA NON-METALLIC DEODORANT (RAD-ONC)
1.0000 "application " | Freq: Once | TOPICAL | Status: AC
  Administered 2015-12-20: 1 via TOPICAL

## 2015-12-20 NOTE — Progress Notes (Signed)

## 2015-12-21 ENCOUNTER — Ambulatory Visit
Admission: RE | Admit: 2015-12-21 | Discharge: 2015-12-21 | Disposition: A | Discharge status: Home / Self Care | Payer: Medicaid Other | Source: Ambulatory Visit | Attending: Radiation Oncology | Admitting: Radiation Oncology

## 2015-12-21 DIAGNOSIS — Z510 Encounter for antineoplastic radiation therapy: Secondary | ICD-10-CM | POA: Diagnosis not present

## 2015-12-22 ENCOUNTER — Ambulatory Visit
Admission: RE | Admit: 2015-12-22 | Discharge: 2015-12-22 | Disposition: A | Discharge status: Home / Self Care | Payer: Medicaid Other | Source: Ambulatory Visit | Attending: Radiation Oncology | Admitting: Radiation Oncology

## 2015-12-22 DIAGNOSIS — Z510 Encounter for antineoplastic radiation therapy: Secondary | ICD-10-CM | POA: Diagnosis not present

## 2015-12-25 ENCOUNTER — Ambulatory Visit
Admission: RE | Admit: 2015-12-25 | Discharge: 2015-12-25 | Disposition: A | Discharge status: Home / Self Care | Payer: Medicaid Other | Source: Ambulatory Visit | Attending: Radiation Oncology | Admitting: Radiation Oncology

## 2015-12-25 ENCOUNTER — Telehealth: Payer: Self-pay | Admitting: *Deleted

## 2015-12-25 ENCOUNTER — Encounter: Payer: Self-pay | Admitting: General Practice

## 2015-12-25 ENCOUNTER — Encounter: Payer: Self-pay | Admitting: *Deleted

## 2015-12-25 DIAGNOSIS — Z510 Encounter for antineoplastic radiation therapy: Secondary | ICD-10-CM | POA: Diagnosis not present

## 2015-12-25 NOTE — Telephone Encounter (Signed)
Called pt to assess needs after start of xrt. Pt relate she does not want to "take radiation" that she "doesn't like the way it makes me feel" and that "i want to quit". Gave pt encouragement and emotional support. Discussed importance of receiving xrt and reasoning behind it. Received verbal understanding and willing to continue with radiation. Encourage pt to call with needs.

## 2015-12-25 NOTE — Progress Notes (Signed)
[place] Work  Clinical Social Work was referred by Pension scheme manager for assessment of psychosocial needs.  Clinical Social Worker met with patient today after her treatment to offer support and assess for needs.  Pt frustrated as her phone was getting to be cut off, per her report. Pt reports she was approved for the grant, but has no bills except phone in her name. CSW reached out to fin counselor team for assistance on this. Pt shared u=she used SCAT on Friday and that "was a disaster". CSW provided listening support and several additional resources to assist. Pt had not followed through on calling Cancer Care for fin asst and CSW provided pt with contact info again today. CSW also provided her with Luiz Ochoa info and support group offerings. Treat bag that was donated by church was given to her and brightened her day. Per radiation therapists pt is making it to treatments timely.  Pt is open to CSW to continue to check in and will bring Cancer Care application for CSW to help complete. Pt was smiling at end of visit.   Clinical Social Work interventions: Supportive listening Emotional support Resource education  Loren Racer, [place] Worker [place]  [place] Phone: [phone] Fax: [phone]

## 2015-12-25 NOTE — Progress Notes (Signed)
Spiritual Care Note on behalf of Duffy Rhody, [employer] counseling intern  Curt Bears has made contact with pt by phone, speaking for 30 min re pt's stressors at home, including lack of transportation.  She provided intro to counseling services.  They scheduled appointment for Friday 2/24 at 1pm; however, pt did not show.  Curt Bears plans to f/u by phone to reschedule and will enter separate Epic note upon IT's resolution of access limitations.  Curt Bears can be reached at [phone] or [email or web address] by entering "secure" in subject line.  [place], [place], [place] Supervisor of Counseling Interns Pager [phone] Voicemail [phone]

## 2015-12-26 ENCOUNTER — Encounter: Payer: Self-pay | Admitting: Radiation Oncology

## 2015-12-26 ENCOUNTER — Ambulatory Visit
Admission: RE | Admit: 2015-12-26 | Discharge: 2015-12-26 | Disposition: A | Discharge status: Home / Self Care | Payer: Medicaid Other | Source: Ambulatory Visit | Attending: Radiation Oncology | Admitting: Radiation Oncology

## 2015-12-26 VITALS — BP 94/53 | HR 98 | Temp 97.5°F | Ht 62.0 in | Wt 248.0 lb

## 2015-12-26 DIAGNOSIS — Z510 Encounter for antineoplastic radiation therapy: Secondary | ICD-10-CM | POA: Diagnosis not present

## 2015-12-26 DIAGNOSIS — C50212 Malignant neoplasm of upper-inner quadrant of left female breast: Secondary | ICD-10-CM

## 2015-12-26 NOTE — Progress Notes (Signed)
Weekly Management Note Current Dose:  12.6 Gy  Projected Dose: 61 Gy   Narrative:  The patient presents for routine under treatment assessment.  CBCT/MVCT images/Port film x-rays were reviewed.  The chart was checked.  Doing well. Met with social work TEFL teacher. Continues to be "tired and overwhelmed."   Physical Findings: Weight: 248 lb (112.492 kg). Slightly dark right breast.   Impression:  The patient is tolerating radiation.  Plan:  Continue treatment as planned. Using radiaplex. Appreciates support team input.

## 2015-12-26 NOTE — Progress Notes (Signed)
Yolanda White has received 7 fractions to her left breast. Pain to her left breast today 4/10 has Oxycodone for pain control. Skin to left breast tanning. UsingRadiaplex gel to left breast as instructed.Marland Kitchen Appetite has increased eating much better than last week. Energy level is low, able to watch her grand-daughter. BP 94/53 mmHg  Pulse 98  Temp(Src) 97.5 F (36.4 C) (Oral)  Ht 5\' 2"  (1.575 m)  Wt 248 lb (112.492 kg)  BMI 45.35 kg/m2  SpO2 100%

## 2015-12-27 ENCOUNTER — Ambulatory Visit
Admission: RE | Admit: 2015-12-27 | Discharge: 2015-12-27 | Disposition: A | Discharge status: Home / Self Care | Payer: Medicaid Other | Source: Ambulatory Visit | Attending: Radiation Oncology | Admitting: Radiation Oncology

## 2015-12-27 ENCOUNTER — Encounter: Payer: Self-pay | Admitting: General Practice

## 2015-12-27 DIAGNOSIS — Z510 Encounter for antineoplastic radiation therapy: Secondary | ICD-10-CM | POA: Diagnosis not present

## 2015-12-27 NOTE — Progress Notes (Signed)
Spiritual Care Note entered on behalf of [facility] Counseling Intern [place] left VM to check in and offer further counseling support.  Curt Bears will enter her own note upon IT's resolution of Epic outpatient access limitations (ticket submitted) and can be reached at [phone] or [email or web address] with "SECURE" in the subject line.  Chaplain Lorrin Jackson, [place], [place] Site Supervisor for [employer] [phone] Voicemail  [phone]

## 2015-12-28 ENCOUNTER — Ambulatory Visit: Payer: Medicaid Other

## 2015-12-28 ENCOUNTER — Ambulatory Visit
Admission: RE | Admit: 2015-12-28 | Discharge: 2015-12-28 | Disposition: A | Discharge status: Home / Self Care | Payer: Medicaid Other | Source: Ambulatory Visit | Attending: Radiation Oncology | Admitting: Radiation Oncology

## 2015-12-29 ENCOUNTER — Ambulatory Visit
Admission: RE | Admit: 2015-12-29 | Discharge: 2015-12-29 | Disposition: A | Discharge status: Home / Self Care | Payer: Medicaid Other | Source: Ambulatory Visit | Attending: Radiation Oncology | Admitting: Radiation Oncology

## 2015-12-29 DIAGNOSIS — Z510 Encounter for antineoplastic radiation therapy: Secondary | ICD-10-CM | POA: Diagnosis not present

## 2016-01-01 ENCOUNTER — Ambulatory Visit: Payer: Medicaid Other

## 2016-01-01 ENCOUNTER — Ambulatory Visit: Payer: Medicaid Other | Attending: Radiation Oncology | Admitting: Radiation Oncology

## 2016-01-01 ENCOUNTER — Ambulatory Visit
Admission: RE | Admit: 2016-01-01 | Discharge: 2016-01-01 | Disposition: A | Discharge status: Home / Self Care | Payer: Medicaid Other | Source: Ambulatory Visit | Attending: Radiation Oncology | Admitting: Radiation Oncology

## 2016-01-02 ENCOUNTER — Ambulatory Visit
Admission: RE | Admit: 2016-01-02 | Discharge: 2016-01-02 | Disposition: A | Discharge status: Home / Self Care | Payer: Medicaid Other | Source: Ambulatory Visit | Attending: Radiation Oncology | Admitting: Radiation Oncology

## 2016-01-02 ENCOUNTER — Ambulatory Visit: Payer: Medicaid Other

## 2016-01-03 ENCOUNTER — Encounter: Payer: Self-pay | Admitting: General Practice

## 2016-01-03 ENCOUNTER — Ambulatory Visit
Admission: RE | Admit: 2016-01-03 | Discharge: 2016-01-03 | Disposition: A | Discharge status: Home / Self Care | Payer: Medicaid Other | Source: Ambulatory Visit | Attending: Radiation Oncology | Admitting: Radiation Oncology

## 2016-01-03 DIAGNOSIS — Z510 Encounter for antineoplastic radiation therapy: Secondary | ICD-10-CM | POA: Diagnosis not present

## 2016-01-03 NOTE — Progress Notes (Signed)
Ms. Venegas requested to be seen by a nurse today.  She has received 10 fractions to her left breast.  She reports that she feels very fatigued due to caring for her children who have colds as well as herself.  VSS. She also reported that she has aching of her breast with intermittent shooting pains in the areola region.  She is currently taking Ibuprofen 800 mg tabs whenever she has pain her her left breast in conjunction with oxycodone.  Advised to take Ibuprofen as ordered by Dr. Lorra Hals TID and to take with either food or milk for better management of her pain. Did not want to see a physician today.  Will check her status tomorrow.  Guadelupe Sabin, RN informed.

## 2016-01-04 ENCOUNTER — Ambulatory Visit: Payer: Medicaid Other

## 2016-01-04 ENCOUNTER — Other Ambulatory Visit: Payer: Self-pay | Admitting: *Deleted

## 2016-01-04 ENCOUNTER — Ambulatory Visit
Admission: RE | Admit: 2016-01-04 | Discharge: 2016-01-04 | Disposition: A | Discharge status: Home / Self Care | Payer: Medicaid Other | Source: Ambulatory Visit | Attending: Radiation Oncology | Admitting: Radiation Oncology

## 2016-01-04 DIAGNOSIS — C50212 Malignant neoplasm of upper-inner quadrant of left female breast: Secondary | ICD-10-CM

## 2016-01-04 DIAGNOSIS — Z510 Encounter for antineoplastic radiation therapy: Secondary | ICD-10-CM | POA: Diagnosis not present

## 2016-01-04 MED ORDER — OXYCODONE-ACETAMINOPHEN 5-325 MG PO TABS: 1.0000 | ORAL_TABLET | ORAL | Status: DC | PRN

## 2016-01-04 NOTE — Progress Notes (Signed)
Spiritual Care Note entered on behalf of [employer] Counseling Intern [place] received return phone call from [facility]; attempted return call, leaving VM.  Curt Bears will enter her own note upon IT's resolution of Epic access limitations.  Curt Bears can be reached at [phone].  Chaplain Lorrin Jackson, [place], [place] Site Supervisor for [employer]

## 2016-01-04 NOTE — Progress Notes (Signed)
Yolanda White stopped by today for a re check of her left  Breast the nipple remains sore and tender no redness, using Radiplex to her breast has Hydrocodone and Ibuprofen for pain.  Radiation toleration is better than in the beginning of her treatment. BP 132/71 mmHg  Pulse 82  Temp(Src) 97.4 F (36.3 C) (Oral)  Resp 18  SpO2 95%

## 2016-01-05 ENCOUNTER — Ambulatory Visit
Admission: RE | Admit: 2016-01-05 | Discharge: 2016-01-05 | Disposition: A | Discharge status: Home / Self Care | Payer: Medicaid Other | Source: Ambulatory Visit | Attending: Radiation Oncology | Admitting: Radiation Oncology

## 2016-01-05 ENCOUNTER — Encounter: Payer: Self-pay | Admitting: *Deleted

## 2016-01-05 ENCOUNTER — Encounter: Payer: Self-pay | Admitting: Radiation Oncology

## 2016-01-05 VITALS — BP 121/87 | HR 85 | Temp 97.6°F | Resp 18 | Ht 62.0 in | Wt 242.4 lb

## 2016-01-05 DIAGNOSIS — C50212 Malignant neoplasm of upper-inner quadrant of left female breast: Secondary | ICD-10-CM

## 2016-01-05 DIAGNOSIS — Z510 Encounter for antineoplastic radiation therapy: Secondary | ICD-10-CM | POA: Diagnosis not present

## 2016-01-05 NOTE — Progress Notes (Signed)
[place] Work  Clinical Social Work was referred by need for follow up, possible transportation issues, re-assessment of psychosocial needs.  Clinical Social Worker met with patient at [facility] prior to radiation to offer support and assess for needs.  Pt shared earlier issues with SCAT. CSW validated that other pts are also having issues and hopefully this will improve. She was concerned staff were upset she was late, CSW explained staff is aware that this can happen and encouraged her to call if late, but still come. CSW provided a booklet on "Finding Joy". Pt appeared more upbeat today and brought letter from [place]. CSW unable to assist with completion of application today due to another counseling appt, but pt plans to bring it back on Monday and CSW to assist.    Clinical Social Work interventions: Emotional support, check in  Loren Racer, [place] Social Worker [place]  [place] Phone: [phone] Fax: [phone]

## 2016-01-05 NOTE — Progress Notes (Addendum)
Yolanda White has received 12 fractions to her left breast.  Denies pain today.  Appetite has decreased trying to eat small portions of food.  Energy level is low.  Skin to left breast has some moisture under the breast no breakage, having some itching also. to the breast the nipple pain is better today.  Asked to keep dry given telfa pad to use.  Using the Radiaplex. BP 121/87 mmHg  Pulse 85  Temp(Src) 97.6 F (36.4 C) (Oral)  Resp 18  Ht 5\' 2"  (1.575 m)  Wt 242 lb 6.4 oz (109.952 kg)  BMI 44.32 kg/m2  SpO2 100%

## 2016-01-05 NOTE — Progress Notes (Signed)
   Weekly Management Note:  Outpatient    ICD-9-CM ICD-10-CM   1. Breast cancer of upper-inner quadrant of left female breast (HCC) 174.2 C50.212     Current Dose:  21.6 Gy  Projected Dose: 61 Gy   Narrative:  The patient presents for routine under treatment assessment.  CBCT/MVCT images/Port film x-rays were reviewed.  The chart was checked.   Ms. Foppe has received 12 fractions to her left breast. She denies pain today. She reports that her appetite has decreased, but she is trying to eat small portions of food. Her energy level is low. Skin to left breast has some moisture under the breast no breakage. She reports that she is having some itching also. She states that her nipple pain is better today. Today, the nurse gave the patient a  telfa pad to use to keep the moist area under her breast dry. Using the Radiaplex.  Physical Findings:  height is 5\' 2"  (1.575 m) and weight is 242 lb 6.4 oz (109.952 kg). Her oral temperature is 97.6 F (36.4 C). Her blood pressure is 121/87 and her pulse is 85. Her respiration is 18 and oxygen saturation is 100%.   Wt Readings from Last 3 Encounters:  01/05/16 242 lb 6.4 oz (109.952 kg)  01/03/16 243 lb 14.4 oz (110.632 kg)  12/26/15 248 lb (112.492 kg)   Along the medial inframmamary fold of left breast, the patient has darkening of the skin but no breakdown.  Impression:  The patient is tolerating radiotherapy.  Plan:  Continue radiotherapy as planned. Patient was instructed to apply Neosporin to affected area if skin begins to peel     ________________________________   Eppie Gibson, M.D.   This document serves as a record of services personally performed by Eppie Gibson, MD. It was created on her behalf by Jenell Milliner, a trained medical scribe. The creation of this record is based on the scribe's personal observations and the provider's statements to them. This document has been checked and approved by the attending provider

## 2016-01-08 ENCOUNTER — Encounter: Payer: Self-pay | Admitting: *Deleted

## 2016-01-08 ENCOUNTER — Ambulatory Visit
Admission: RE | Admit: 2016-01-08 | Discharge: 2016-01-08 | Disposition: A | Discharge status: Home / Self Care | Payer: Medicaid Other | Source: Ambulatory Visit | Attending: Radiation Oncology | Admitting: Radiation Oncology

## 2016-01-08 DIAGNOSIS — Z510 Encounter for antineoplastic radiation therapy: Secondary | ICD-10-CM | POA: Diagnosis not present

## 2016-01-08 NOTE — Progress Notes (Signed)
[place] Work  Clinical Social Work met with pt to complete Cancer Care application with pt and [employer] application. Pt continues to use SCAT to get to her treatments and this has been somewhat stressful due to issues with SCAT being late. Pt shared the one year anniversary of her father's death is this Jan 09, 2023. She is not sure she will be up to coming to [facility] that day. We discussed grief and loss, anniversary dates and ways to cope around these issues. Pt shared an increase in her anxiety this week as the date draws closer. CSW validated emotions, very appropriate for what lies ahead. We discussed how often the actual anniversary date is not terrible, but the days leading up to it can be extremely difficult. Pt was started on zoloft by her PCP, but she feels this is making her more anxious and not able to sleep. She plans to share these concerns with her PCP at her upcoming appointment. Pt shared she is interested in journaling and CSW provided her with a journal today.  CSW will check back in with pt Wed or Thursday. Pt aware to reach out as needed.    Clinical Social Work interventions: Emotional support Resource assistance  Loren Racer, [place] Worker [place]  [place] Phone: [phone] Fax: [phone]

## 2016-01-09 ENCOUNTER — Ambulatory Visit
Admission: RE | Admit: 2016-01-09 | Discharge: 2016-01-09 | Disposition: A | Discharge status: Home / Self Care | Payer: Medicaid Other | Source: Ambulatory Visit | Attending: Radiation Oncology | Admitting: Radiation Oncology

## 2016-01-09 ENCOUNTER — Encounter: Payer: Self-pay | Admitting: Radiation Oncology

## 2016-01-09 VITALS — BP 123/86 | HR 85 | Temp 98.2°F | Resp 18 | Ht 62.0 in | Wt 246.5 lb

## 2016-01-09 DIAGNOSIS — C50212 Malignant neoplasm of upper-inner quadrant of left female breast: Secondary | ICD-10-CM

## 2016-01-09 DIAGNOSIS — Z510 Encounter for antineoplastic radiation therapy: Secondary | ICD-10-CM | POA: Diagnosis not present

## 2016-01-09 NOTE — Progress Notes (Signed)
Yolanda White has received 14 fractions to her left breast.  Pain level 6/10 taking Oxycodone for pain control.  Appetite is altered has not eaten today.  Energy level having fatigue sleeping more the past few days.  Skin th her left breast with tanning breast is tanning with dark spots under the breast.  Using Radiaplex.gel. BP 123/86 mmHg  Pulse 85  Temp(Src) 98.2 F (36.8 C)  Resp 18  Ht 5\' 2"  (1.575 m)  Wt 246 lb 8 oz (111.812 kg)  BMI 45.07 kg/m2  SpO2 96%

## 2016-01-09 NOTE — Progress Notes (Signed)
Weekly Management Note Current Dose: 25.2  Gy  Projected Dose:  61 Gy   Narrative:  The patient presents for routine under treatment assessment.  CBCT/MVCT images/Port film x-rays were reviewed.  The chart was checked. Some irritation under breast and in axilla. Sad about the anniversary of her father's death this week.   Physical Findings: Weight: 246 lb 8 oz (111.812 kg). Slightly dark left breast. No moist desquamation.   Impression:  The patient is tolerating radiation.  Plan:  Continue treatment as planned. Continue radiaplex. Pt is journaling after conversation with SW.

## 2016-01-10 ENCOUNTER — Ambulatory Visit
Admission: RE | Admit: 2016-01-10 | Discharge: 2016-01-10 | Disposition: A | Discharge status: Home / Self Care | Payer: Medicaid Other | Source: Ambulatory Visit | Attending: Radiation Oncology | Admitting: Radiation Oncology

## 2016-01-10 ENCOUNTER — Ambulatory Visit: Admission: RE | Admit: 2016-01-10 | Payer: Medicaid Other | Source: Ambulatory Visit

## 2016-01-11 ENCOUNTER — Encounter: Payer: Self-pay | Admitting: *Deleted

## 2016-01-11 ENCOUNTER — Telehealth: Payer: Self-pay | Admitting: *Deleted

## 2016-01-11 ENCOUNTER — Ambulatory Visit: Payer: Medicaid Other

## 2016-01-11 DIAGNOSIS — Z510 Encounter for antineoplastic radiation therapy: Secondary | ICD-10-CM | POA: Diagnosis not present

## 2016-01-11 NOTE — Telephone Encounter (Signed)
Called unable to reach Yolanda White to try and find out what is going on with her why she is missing her radiation appointments.  Asked her to give me a call back at [phone] tomorrow.

## 2016-01-11 NOTE — Progress Notes (Signed)
[place] Work  Clinical Social Work was referred by radiation therapist for assessment of psychosocial needs due to pt missing two days of treatment.  Clinical Social Worker contacted pt via phone and left supportive, encouraging message. CSW encouraged pt to return CSW call in order for CSW to further assist.  CSW had met with pt on Monday and pt was in pretty good spirits. However, pt has first anniversary of her father's death tomorrow and her grief has been difficult for her. Pt was able to verbalize these concerns. CSW feels this issue is contributing to her missing treatments. CSW to follow and assist accordingly.   Loren Racer, [place] Worker [place]  [place] Phone: [phone] Fax: [phone]

## 2016-01-11 NOTE — Progress Notes (Signed)
[place] Work  Pt returned CSW call. Pt cancelled her SCAT rides for the rest of the week as she is "just not up to facing people right now". Pt feeling angry due to grief and needs time to process her situation. Pt will consider coming for tx tomorrow. CSW discussed additional transportation options and pt plans to reach out to [facility] as an additional resource. Pt appreciated call and will continue to be in touch with CSW.   Clinical Social Work interventions: Supportive listening Resource education  Yolanda White, [place] Worker [place]  [place] Phone: [phone] Fax: [phone]

## 2016-01-12 ENCOUNTER — Telehealth: Payer: Self-pay | Admitting: *Deleted

## 2016-01-12 ENCOUNTER — Ambulatory Visit: Payer: Medicaid Other

## 2016-01-12 ENCOUNTER — Ambulatory Visit
Admission: RE | Admit: 2016-01-12 | Discharge: 2016-01-12 | Disposition: A | Discharge status: Home / Self Care | Payer: Medicaid Other | Source: Ambulatory Visit | Attending: Radiation Oncology | Admitting: Radiation Oncology

## 2016-01-12 NOTE — Telephone Encounter (Signed)
Yolanda White returned my call this morning shared with me that this week has been very bad for her because it's the anniversary of her father's death on 01/25/16 and she is having a very hard time emotionally.  She also has had some nausea and vomiting a few days; for the most part just had not wanted to be around anyone the past few days.  Stated she would see [place] next week to continue her radiation treatments.  I called [facility] to share the information and they reported she called them also today.

## 2016-01-12 NOTE — Telephone Encounter (Signed)
Hey Tonie.  Thank you for calling her.  This week is the anniversary of her father's death and she told me she wouldn't be at treatment yesterday and maybe today.  (Social work is meeting with her as well and you can look at [employer] note under the Notes section).  Why she is missing the other days, I'm not sure! Thanks for following up with her.

## 2016-01-15 ENCOUNTER — Ambulatory Visit
Admission: RE | Admit: 2016-01-15 | Discharge: 2016-01-15 | Disposition: A | Discharge status: Home / Self Care | Payer: Medicaid Other | Source: Ambulatory Visit | Attending: Radiation Oncology | Admitting: Radiation Oncology

## 2016-01-15 DIAGNOSIS — Z510 Encounter for antineoplastic radiation therapy: Secondary | ICD-10-CM | POA: Diagnosis not present

## 2016-01-16 ENCOUNTER — Ambulatory Visit
Admission: RE | Admit: 2016-01-16 | Discharge: 2016-01-16 | Disposition: A | Discharge status: Home / Self Care | Payer: Medicaid Other | Source: Ambulatory Visit | Attending: Radiation Oncology | Admitting: Radiation Oncology

## 2016-01-16 ENCOUNTER — Ambulatory Visit: Payer: Medicaid Other | Admitting: Radiation Oncology

## 2016-01-16 DIAGNOSIS — Z510 Encounter for antineoplastic radiation therapy: Secondary | ICD-10-CM | POA: Diagnosis not present

## 2016-01-16 DIAGNOSIS — C50212 Malignant neoplasm of upper-inner quadrant of left female breast: Secondary | ICD-10-CM

## 2016-01-16 NOTE — Progress Notes (Signed)
Weekly Management Note Current Dose: 28.8  Gy  Projected Dose:  61 Gy   Narrative:  The patient presents for routine under treatment assessment.  CBCT/MVCT images/Port film x-rays were reviewed.  The chart was checked. Some irritation under breast and in axilla. Proud of herself for making it through anniversary of her father's death.   Physical Findings: Weight:  . Slightly dark left breast. No moist desquamation but inframammary fold is dark.   Impression:  The patient is tolerating radiation.  Plan:  Continue treatment as planned. Continue radiaplex. SW following.

## 2016-01-17 ENCOUNTER — Encounter: Payer: Self-pay | Admitting: *Deleted

## 2016-01-17 ENCOUNTER — Ambulatory Visit
Admission: RE | Admit: 2016-01-17 | Discharge: 2016-01-17 | Disposition: A | Discharge status: Home / Self Care | Payer: Medicaid Other | Source: Ambulatory Visit | Attending: Radiation Oncology | Admitting: Radiation Oncology

## 2016-01-17 DIAGNOSIS — Z510 Encounter for antineoplastic radiation therapy: Secondary | ICD-10-CM | POA: Diagnosis not present

## 2016-01-17 NOTE — Progress Notes (Addendum)
[place] Work  Clinical Social Work was contacted by pt due to issues with SCAT. Pt reports several concerns with SCAT recently and wants suggestions for additional transportation options. CSW and pt discussed ways to process concerns with SCAT/[employer] customer service. Pt also plans to reach out to [facility] office for another transportation option. Pt has not returned to her PCP to get her anxiety meds adjusted and it appears pt is really struggling with this right now. She plans to meet with counseling intern on Friday after radiation. Pt was able to verbalize emotions, anxieties and concerns over the phone. CSW will continue to follow closely.   Clinical Social Work interventions: Emotional support and listening Resource assistance  Loren Racer, [place] Worker [place]  [place] Phone: [phone] Fax: [phone]

## 2016-01-17 NOTE — Progress Notes (Signed)
Counseling intern followed up with Pt regarding setting up counseling services. Pt scheduled intake session with counselor for 9-10am on Friday 3/24.   Duffy Rhody Counseling Intern

## 2016-01-18 ENCOUNTER — Ambulatory Visit
Admission: RE | Admit: 2016-01-18 | Discharge: 2016-01-18 | Disposition: A | Discharge status: Home / Self Care | Payer: Medicaid Other | Source: Ambulatory Visit | Attending: Radiation Oncology | Admitting: Radiation Oncology

## 2016-01-18 ENCOUNTER — Ambulatory Visit: Payer: Medicaid Other | Admitting: Hematology

## 2016-01-18 ENCOUNTER — Encounter: Payer: Self-pay | Admitting: *Deleted

## 2016-01-18 ENCOUNTER — Other Ambulatory Visit: Payer: Medicaid Other

## 2016-01-18 DIAGNOSIS — Z510 Encounter for antineoplastic radiation therapy: Secondary | ICD-10-CM | POA: Diagnosis not present

## 2016-01-18 NOTE — Progress Notes (Signed)
[place] Work  Clinical Social Work continues to follow and assist. Pt continued to have issues with [employer], so we have worked on other transportation options. Pt has arranged medicaid transportation through [employer] starting next Tuesday. CSW reviewed additional applications submitted for assistance. Pt should hear back from [place] this week. She was educated that Scott could take weeks. Pt now working closer with financial advocate for assistance as well. Pt appears more able to self advocate. CSW and pt to meet on Friday, March 24 to work on additional paperwork. Pt also to meet with [facility] for counseling on that day as well.    Clinical Social Work interventions: Resource assistance  Loren Racer, [place] Worker [place]  [place] Phone: [phone] Fax: [phone]

## 2016-01-19 ENCOUNTER — Ambulatory Visit
Admission: RE | Admit: 2016-01-19 | Discharge: 2016-01-19 | Disposition: A | Discharge status: Home / Self Care | Payer: Medicaid Other | Source: Ambulatory Visit | Attending: Radiation Oncology | Admitting: Radiation Oncology

## 2016-01-19 DIAGNOSIS — Z510 Encounter for antineoplastic radiation therapy: Secondary | ICD-10-CM | POA: Diagnosis not present

## 2016-01-19 NOTE — Progress Notes (Signed)
Counseling intern had initial intake session with pt at 9am. Counseling intern gathered basic demographic information as well as general background information in order to begin working with pt on presenting concerns. Pt indicated that she would like to process issues concerning her family of origin and feelings of hopelessness. Pt was tearful throughout session with appropriate affect and depressed mood. Counseling intern collaborated with client on direction for future sessions. Pt scheduled second session for 3/31 at 8:15am.  [facility] Counseling Intern

## 2016-01-22 ENCOUNTER — Ambulatory Visit: Payer: Medicaid Other

## 2016-01-22 ENCOUNTER — Encounter: Payer: Self-pay | Admitting: *Deleted

## 2016-01-22 DIAGNOSIS — Z510 Encounter for antineoplastic radiation therapy: Secondary | ICD-10-CM | POA: Diagnosis not present

## 2016-01-22 NOTE — Progress Notes (Signed)
[facility] Clinical Social Work  Clinical Social Work was referred by patient for assessment of psychosocial needs.  Clinical Social Worker met with patient after treatment to check in and address questions. Pt received letter from [facility] and funds are on the way. Pt had several questions about their funding process and CSW answered her questions. Pt in good spirits today. CSW to continue to follow.   Clinical Social Work interventions: Resource assistance  Grier Hock, LCSW Clinical Social Worker [facility]  [facility] Phone: [phone] Fax: [phone]         

## 2016-01-23 ENCOUNTER — Ambulatory Visit
Admission: RE | Admit: 2016-01-23 | Discharge: 2016-01-23 | Disposition: A | Discharge status: Home / Self Care | Payer: Medicaid Other | Source: Ambulatory Visit | Attending: Radiation Oncology | Admitting: Radiation Oncology

## 2016-01-23 DIAGNOSIS — C50212 Malignant neoplasm of upper-inner quadrant of left female breast: Secondary | ICD-10-CM

## 2016-01-23 DIAGNOSIS — Z510 Encounter for antineoplastic radiation therapy: Secondary | ICD-10-CM | POA: Diagnosis not present

## 2016-01-23 MED ORDER — SONAFINE EX EMUL
1.0000 "application " | Freq: Two times a day (BID) | CUTANEOUS | Status: DC
  Administered 2016-01-23: 1 via TOPICAL
  Filled 2016-01-23: qty 45

## 2016-01-23 NOTE — Progress Notes (Signed)
Weekly Management Note Current Dose: 37.38  Gy  Projected Dose:  61 Gy   Narrative:  The patient presents for routine under treatment assessment.  CBCT/MVCT images/Port film x-rays were reviewed.  The chart was checked. Some irritation under breast and in axilla. More itching this week. Saw on tx machine.   Physical Findings: Weight:  . Slightly dark left breast. No moist desquamation but inframammary fold is dark.   Impression:  The patient is tolerating radiation.  Plan:  Continue treatment as planned. Switch to sonafine. Starts boost next week.

## 2016-01-24 ENCOUNTER — Ambulatory Visit
Admission: RE | Admit: 2016-01-24 | Discharge: 2016-01-24 | Disposition: A | Discharge status: Home / Self Care | Payer: Medicaid Other | Source: Ambulatory Visit | Attending: Radiation Oncology | Admitting: Radiation Oncology

## 2016-01-24 DIAGNOSIS — Z510 Encounter for antineoplastic radiation therapy: Secondary | ICD-10-CM | POA: Diagnosis not present

## 2016-01-25 ENCOUNTER — Ambulatory Visit: Admission: RE | Admit: 2016-01-25 | Payer: Medicaid Other | Source: Ambulatory Visit | Admitting: Radiation Oncology

## 2016-01-25 ENCOUNTER — Ambulatory Visit: Payer: Medicaid Other

## 2016-01-25 ENCOUNTER — Ambulatory Visit
Admission: RE | Admit: 2016-01-25 | Discharge: 2016-01-25 | Disposition: A | Discharge status: Home / Self Care | Payer: Medicaid Other | Source: Ambulatory Visit | Attending: Radiation Oncology | Admitting: Radiation Oncology

## 2016-01-25 DIAGNOSIS — Z510 Encounter for antineoplastic radiation therapy: Secondary | ICD-10-CM | POA: Diagnosis not present

## 2016-01-26 ENCOUNTER — Ambulatory Visit: Payer: Medicaid Other

## 2016-01-26 ENCOUNTER — Telehealth: Payer: Self-pay | Admitting: *Deleted

## 2016-01-26 ENCOUNTER — Ambulatory Visit
Admission: RE | Admit: 2016-01-26 | Discharge: 2016-01-26 | Disposition: A | Discharge status: Home / Self Care | Payer: Medicaid Other | Source: Ambulatory Visit | Attending: Radiation Oncology | Admitting: Radiation Oncology

## 2016-01-26 NOTE — Addendum Note (Signed)
Encounter addended by: Malena Edman, RN on: 01/26/2016 11:02 AM<BR>     Documentation filed: Demographics Visit

## 2016-01-26 NOTE — Progress Notes (Signed)
Pt no-showed second counseling appointment with counseling intern. Pt was scheduled to be seen from 8:15-9:15am on 3/31. Counseling intern called pt to follow up and left a voicemail. Counseling intern will continue to follow up with pt and attempt to reschedule.   Duffy Rhody Counseling Intern

## 2016-01-26 NOTE — Telephone Encounter (Signed)
Spoke with Yolanda White to find out what was going on with her today why she was not able to come in for her treatment.  "I just feel back I am aching and coughing, temperature 101.5 during the night.  I don't know what it is now and I can't take my temperature now.  I did take flu and cold medication during the night.  My grandchildren are sick and I think I got this from them. I suggested that she try to eat and drink monitor her fever if she does not start to feel better go to her PCP since it's been most of the week she has not felt good..  She said she would call her PCP today and make an appointment.  I asked her to call me back and let me know about the outcome of her doctor's visit.  Treatment L2 made aware.

## 2016-01-29 ENCOUNTER — Ambulatory Visit
Admission: RE | Admit: 2016-01-29 | Discharge: 2016-01-29 | Disposition: A | Discharge status: Home / Self Care | Payer: Medicaid Other | Source: Ambulatory Visit | Attending: Radiation Oncology | Admitting: Radiation Oncology

## 2016-01-29 ENCOUNTER — Ambulatory Visit: Payer: Medicaid Other

## 2016-01-29 DIAGNOSIS — Z510 Encounter for antineoplastic radiation therapy: Secondary | ICD-10-CM | POA: Diagnosis not present

## 2016-01-30 ENCOUNTER — Encounter: Payer: Self-pay | Admitting: Radiation Oncology

## 2016-01-30 ENCOUNTER — Ambulatory Visit
Admission: RE | Admit: 2016-01-30 | Discharge: 2016-01-30 | Disposition: A | Discharge status: Home / Self Care | Payer: Medicaid Other | Source: Ambulatory Visit | Attending: Radiation Oncology | Admitting: Radiation Oncology

## 2016-01-30 ENCOUNTER — Other Ambulatory Visit: Payer: Self-pay | Admitting: Radiation Oncology

## 2016-01-30 ENCOUNTER — Ambulatory Visit: Payer: Medicaid Other

## 2016-01-30 VITALS — BP 111/77 | HR 74 | Temp 98.0°F | Ht 62.0 in | Wt 244.6 lb

## 2016-01-30 DIAGNOSIS — C50212 Malignant neoplasm of upper-inner quadrant of left female breast: Secondary | ICD-10-CM

## 2016-01-30 DIAGNOSIS — Z510 Encounter for antineoplastic radiation therapy: Secondary | ICD-10-CM | POA: Diagnosis not present

## 2016-01-30 MED ORDER — OXYCODONE-ACETAMINOPHEN 5-325 MG PO TABS
1.0000 | ORAL_TABLET | ORAL | Status: DC | PRN
Start: 2016-01-30 — End: 2016-03-13

## 2016-01-30 NOTE — Progress Notes (Signed)
Yolanda White has received 25 fractions to her left breast.  Skin to left breast with moist desquamation.  Given  genetian violet.  Appetite is altered because of pain.  Having fatigue most of the day.  Pain 5/10 does not have any of her pain medication Ibufropen or Oxycodone.  BP 111/77 mmHg  Pulse 74  Temp(Src) 98 F (36.7 C) (Oral)  Ht 5\' 2"  (1.575 m)  Wt 244 lb 9.6 oz (110.95 kg)  BMI 44.73 kg/m2  SpO2 100%

## 2016-01-30 NOTE — Progress Notes (Addendum)
Weekly Management Note Current Dose: 45  Gy  Projected Dose: 61Gy   Narrative:  The patient presents for routine under treatment assessment.  CBCT/MVCT images/Port film x-rays were reviewed.  The chart was checked. Skin breakdown in inframammary fold is painful. Has no pain medication left. Feeling bad and tearful.   Physical Findings: Weight: 244 lb 9.6 oz (110.95 kg). Moist desquamation in the inframammary fold.   Impression:  The patient is tolerating radiation.  Plan:  Continue treatment as planned.Gentian violet applied. Wrote for percocet 5 mg. #30. No refiils. To take every 4 hours as needed.

## 2016-01-31 ENCOUNTER — Ambulatory Visit: Payer: Medicaid Other

## 2016-01-31 ENCOUNTER — Ambulatory Visit
Admission: RE | Admit: 2016-01-31 | Discharge: 2016-01-31 | Disposition: A | Discharge status: Home / Self Care | Payer: Medicaid Other | Source: Ambulatory Visit | Attending: Radiation Oncology | Admitting: Radiation Oncology

## 2016-01-31 DIAGNOSIS — Z510 Encounter for antineoplastic radiation therapy: Secondary | ICD-10-CM | POA: Diagnosis not present

## 2016-02-01 ENCOUNTER — Ambulatory Visit
Admission: RE | Admit: 2016-02-01 | Discharge: 2016-02-01 | Disposition: A | Discharge status: Home / Self Care | Payer: Medicaid Other | Source: Ambulatory Visit | Attending: Radiation Oncology | Admitting: Radiation Oncology

## 2016-02-01 ENCOUNTER — Ambulatory Visit: Payer: Medicaid Other

## 2016-02-01 DIAGNOSIS — Z510 Encounter for antineoplastic radiation therapy: Secondary | ICD-10-CM | POA: Diagnosis not present

## 2016-02-01 NOTE — Progress Notes (Signed)
In today foe gentian violet application to her left breast.  The left breast looks much better today the skin irritation is drying out with the gentian violet.  Will come back again tomorrow.

## 2016-02-02 ENCOUNTER — Ambulatory Visit
Admission: RE | Admit: 2016-02-02 | Discharge: 2016-02-02 | Disposition: A | Discharge status: Home / Self Care | Payer: Medicaid Other | Source: Ambulatory Visit | Attending: Radiation Oncology | Admitting: Radiation Oncology

## 2016-02-02 ENCOUNTER — Ambulatory Visit: Payer: Medicaid Other

## 2016-02-02 ENCOUNTER — Encounter: Payer: Self-pay | Admitting: *Deleted

## 2016-02-02 DIAGNOSIS — Z510 Encounter for antineoplastic radiation therapy: Secondary | ICD-10-CM | POA: Diagnosis not present

## 2016-02-02 NOTE — Progress Notes (Signed)
[place] Work  Clinical Social Work was referred by (patient navigator, nurse, Pension scheme manager, Futures trader, Development worker, community, patient) for assessment of psychosocial needs.  Clinical Education officer, museum (met with or contacted) patient (at [facility], at home, or in the hospital) to offer support and assess for needs.      Clinical Social Work interventions:  Polo Riley, MSW, LCSW, OSW-C Clinical Social Worker [place] [phone]       [place] Work met with pt in the lobby as she was waiting for her treatment. Pt stated her new transportation was working better and was less stressful. Pt expressed her phone was turned off as she could not get cell phone bill printed out. CSW attempted to assist her with this process, but could not reach a conclusion today. Pt still awaiting several agencies to respond to her requests for assistance. Pt had hoped the [employer] would cover her hotel stay, but this was declined. Pt has been staying at extended stay motel and paying by the month as a means to seek quiet away from her family.   Pt requesting assistance contacting Zorita Pang, counseling intern for further counseling. They plan to get standing appt set. Pt reports she has had a lot of pain and issues due to her radiation. She is eager to be finished. CSW will continue to assist and support pt.   Clinical Social Work interventions: Support and resource assistance  Loren Racer, [place] Social Worker [place]  [place] Phone: [phone] Fax: [phone]

## 2016-02-02 NOTE — Progress Notes (Signed)
Counseling intern provided brief emotional support to pt in the [place] lobby following her treatment. Pt discussed current emotional and physical stressors and briefly processed feelings of grief. Counseling intern and pt set up 2nd appointment for next Friday (4/13) at 8:15am.   Duffy Rhody Counseling Intern

## 2016-02-05 ENCOUNTER — Ambulatory Visit
Admission: RE | Admit: 2016-02-05 | Discharge: 2016-02-05 | Disposition: A | Discharge status: Home / Self Care | Payer: Medicaid Other | Source: Ambulatory Visit | Attending: Radiation Oncology | Admitting: Radiation Oncology

## 2016-02-05 ENCOUNTER — Ambulatory Visit: Payer: Medicaid Other

## 2016-02-05 ENCOUNTER — Encounter (HOSPITAL_COMMUNITY): Payer: Self-pay | Admitting: *Deleted

## 2016-02-05 DIAGNOSIS — F17210 Nicotine dependence, cigarettes, uncomplicated: Secondary | ICD-10-CM | POA: Diagnosis present

## 2016-02-05 DIAGNOSIS — K5900 Constipation, unspecified: Secondary | ICD-10-CM | POA: Diagnosis not present

## 2016-02-05 DIAGNOSIS — C50212 Malignant neoplasm of upper-inner quadrant of left female breast: Secondary | ICD-10-CM | POA: Diagnosis present

## 2016-02-05 DIAGNOSIS — A09 Infectious gastroenteritis and colitis, unspecified: Secondary | ICD-10-CM | POA: Diagnosis present

## 2016-02-05 DIAGNOSIS — Z6841 Body Mass Index (BMI) 40.0 and over, adult: Secondary | ICD-10-CM

## 2016-02-05 DIAGNOSIS — Z23 Encounter for immunization: Secondary | ICD-10-CM

## 2016-02-05 DIAGNOSIS — Z510 Encounter for antineoplastic radiation therapy: Secondary | ICD-10-CM | POA: Diagnosis not present

## 2016-02-05 DIAGNOSIS — J4521 Mild intermittent asthma with (acute) exacerbation: Principal | ICD-10-CM | POA: Diagnosis present

## 2016-02-05 DIAGNOSIS — N182 Chronic kidney disease, stage 2 (mild): Secondary | ICD-10-CM | POA: Diagnosis present

## 2016-02-05 LAB — URINALYSIS, ROUTINE W REFLEX MICROSCOPIC
BILIRUBIN URINE: NEGATIVE
Glucose, UA: NEGATIVE mg/dL
Hgb urine dipstick: NEGATIVE
KETONES UR: NEGATIVE mg/dL
Leukocytes, UA: NEGATIVE
NITRITE: NEGATIVE
PH: 5 (ref 5.0–8.0)
Protein, ur: NEGATIVE mg/dL
Specific Gravity, Urine: 1.017 (ref 1.005–1.030)

## 2016-02-05 LAB — CBC
HEMATOCRIT: 37.3 % (ref 36.0–46.0)
Hemoglobin: 12.7 g/dL (ref 12.0–15.0)
MCH: 27.9 pg (ref 26.0–34.0)
MCHC: 34 g/dL (ref 30.0–36.0)
MCV: 82 fL (ref 78.0–100.0)
PLATELETS: 263 10*3/uL (ref 150–400)
RBC: 4.55 MIL/uL (ref 3.87–5.11)
RDW: 13.1 % (ref 11.5–15.5)
WBC: 10.9 10*3/uL — ABNORMAL HIGH (ref 4.0–10.5)

## 2016-02-05 MED ORDER — ONDANSETRON 4 MG PO TBDP
4.0000 mg | ORAL_TABLET | Freq: Once | ORAL | Status: AC | PRN
  Administered 2016-02-05: 4 mg via ORAL

## 2016-02-05 MED ORDER — ONDANSETRON 4 MG PO TBDP
ORAL_TABLET | ORAL | Status: AC
  Filled 2016-02-05: qty 1

## 2016-02-05 NOTE — ED Notes (Signed)
Pt reports n/v since 1400. Pt had radiation treatment for breast cancer this morning. Pt also repots dysuria and back pain.

## 2016-02-06 ENCOUNTER — Ambulatory Visit: Payer: Medicaid Other

## 2016-02-06 ENCOUNTER — Inpatient Hospital Stay (HOSPITAL_COMMUNITY)
Admission: EM | Admit: 2016-02-06 | Discharge: 2016-02-09 | DRG: 202 | Disposition: A | Discharge status: Home / Self Care | Payer: Medicaid Other | Attending: Internal Medicine | Admitting: Internal Medicine

## 2016-02-06 ENCOUNTER — Emergency Department (HOSPITAL_COMMUNITY): Payer: Medicaid Other

## 2016-02-06 ENCOUNTER — Observation Stay (HOSPITAL_COMMUNITY): Payer: Medicaid Other

## 2016-02-06 ENCOUNTER — Ambulatory Visit: Admission: RE | Admit: 2016-02-06 | Payer: Medicaid Other | Source: Ambulatory Visit | Admitting: Radiation Oncology

## 2016-02-06 ENCOUNTER — Encounter (HOSPITAL_COMMUNITY): Payer: Self-pay | Admitting: Radiology

## 2016-02-06 DIAGNOSIS — F17210 Nicotine dependence, cigarettes, uncomplicated: Secondary | ICD-10-CM | POA: Diagnosis present

## 2016-02-06 DIAGNOSIS — J9801 Acute bronchospasm: Secondary | ICD-10-CM

## 2016-02-06 DIAGNOSIS — J45901 Unspecified asthma with (acute) exacerbation: Secondary | ICD-10-CM | POA: Diagnosis present

## 2016-02-06 DIAGNOSIS — K56609 Unspecified intestinal obstruction, unspecified as to partial versus complete obstruction: Secondary | ICD-10-CM

## 2016-02-06 DIAGNOSIS — Z720 Tobacco use: Secondary | ICD-10-CM | POA: Diagnosis not present

## 2016-02-06 DIAGNOSIS — J4521 Mild intermittent asthma with (acute) exacerbation: Principal | ICD-10-CM

## 2016-02-06 DIAGNOSIS — R197 Diarrhea, unspecified: Secondary | ICD-10-CM

## 2016-02-06 DIAGNOSIS — J4551 Severe persistent asthma with (acute) exacerbation: Secondary | ICD-10-CM | POA: Diagnosis present

## 2016-02-06 DIAGNOSIS — R112 Nausea with vomiting, unspecified: Secondary | ICD-10-CM

## 2016-02-06 DIAGNOSIS — M545 Low back pain, unspecified: Secondary | ICD-10-CM

## 2016-02-06 DIAGNOSIS — R1110 Vomiting, unspecified: Secondary | ICD-10-CM | POA: Insufficient documentation

## 2016-02-06 DIAGNOSIS — R0902 Hypoxemia: Secondary | ICD-10-CM

## 2016-02-06 LAB — COMPREHENSIVE METABOLIC PANEL
ALBUMIN: 3.8 g/dL (ref 3.5–5.0)
ALK PHOS: 105 U/L (ref 38–126)
ALT: 23 U/L (ref 14–54)
AST: 24 U/L (ref 15–41)
Anion gap: 12 (ref 5–15)
BILIRUBIN TOTAL: 1.2 mg/dL (ref 0.3–1.2)
CALCIUM: 9.3 mg/dL (ref 8.9–10.3)
CO2: 22 mmol/L (ref 22–32)
Chloride: 104 mmol/L (ref 101–111)
Creatinine, Ser: 1.14 mg/dL — ABNORMAL HIGH (ref 0.44–1.00)
GFR calc Af Amer: >60 mL/min (ref >60)
GFR calc non Af Amer: 58 mL/min — ABNORMAL LOW (ref >60)
GLUCOSE: 121 mg/dL — AB (ref 65–99)
Potassium: 3.7 mmol/L (ref 3.5–5.1)
Sodium: 138 mmol/L (ref 135–145)
TOTAL PROTEIN: 7.8 g/dL (ref 6.5–8.1)

## 2016-02-06 LAB — CBC
HCT: 35.9 % — ABNORMAL LOW (ref 36.0–46.0)
Hemoglobin: 12.5 g/dL (ref 12.0–15.0)
MCH: 28.7 pg (ref 26.0–34.0)
MCHC: 34.8 g/dL (ref 30.0–36.0)
MCV: 82.5 fL (ref 78.0–100.0)
PLATELETS: 262 10*3/uL (ref 150–400)
RBC: 4.35 MIL/uL (ref 3.87–5.11)
RDW: 13.2 % (ref 11.5–15.5)
WBC: 7.1 10*3/uL (ref 4.0–10.5)

## 2016-02-06 LAB — INFLUENZA PANEL BY PCR (TYPE A & B)
H1N1FLUPCR: NOT DETECTED
INFLAPCR: NEGATIVE
INFLBPCR: NEGATIVE

## 2016-02-06 LAB — CREATININE, SERUM
CREATININE: 1.17 mg/dL — AB (ref 0.44–1.00)
GFR, EST NON AFRICAN AMERICAN: 56 mL/min — AB (ref >60)

## 2016-02-06 LAB — MRSA PCR SCREENING: MRSA BY PCR: NEGATIVE

## 2016-02-06 LAB — LIPASE, BLOOD: Lipase: 25 U/L (ref 11–51)

## 2016-02-06 MED ORDER — ALBUTEROL SULFATE (2.5 MG/3ML) 0.083% IN NEBU
2.5000 mg | INHALATION_SOLUTION | RESPIRATORY_TRACT | Status: DC | PRN
  Administered 2016-02-09: 2.5 mg via RESPIRATORY_TRACT
  Filled 2016-02-06: qty 3

## 2016-02-06 MED ORDER — SODIUM CHLORIDE 0.9 % IV BOLUS (SEPSIS)
1000.0000 mL | Freq: Once | INTRAVENOUS | Status: AC
Start: 2016-02-06 — End: 2016-02-06
  Administered 2016-02-06: 1000 mL via INTRAVENOUS

## 2016-02-06 MED ORDER — SENNOSIDES-DOCUSATE SODIUM 8.6-50 MG PO TABS
1.0000 | ORAL_TABLET | Freq: Every evening | ORAL | Status: DC | PRN
  Administered 2016-02-07: 1 via ORAL
  Filled 2016-02-06: qty 1

## 2016-02-06 MED ORDER — ACETAMINOPHEN 325 MG PO TABS
650.0000 mg | ORAL_TABLET | Freq: Four times a day (QID) | ORAL | Status: DC | PRN
  Administered 2016-02-08: 650 mg via ORAL
  Filled 2016-02-06: qty 2

## 2016-02-06 MED ORDER — MAGNESIUM SULFATE 2 GM/50ML IV SOLN
2.0000 g | Freq: Once | INTRAVENOUS | Status: AC
  Administered 2016-02-06: 2 g via INTRAVENOUS
  Filled 2016-02-06: qty 50

## 2016-02-06 MED ORDER — OXYCODONE-ACETAMINOPHEN 5-325 MG PO TABS
1.0000 | ORAL_TABLET | ORAL | Status: DC | PRN
Start: 2016-02-06 — End: 2016-02-09
  Administered 2016-02-06 – 2016-02-09 (×10): 1 via ORAL
  Filled 2016-02-06 (×10): qty 1

## 2016-02-06 MED ORDER — GUAIFENESIN ER 600 MG PO TB12
600.0000 mg | ORAL_TABLET | Freq: Two times a day (BID) | ORAL | Status: DC
  Administered 2016-02-06 – 2016-02-09 (×7): 600 mg via ORAL
  Filled 2016-02-06 (×7): qty 1

## 2016-02-06 MED ORDER — ONDANSETRON HCL 4 MG/2ML IJ SOLN
4.0000 mg | Freq: Once | INTRAMUSCULAR | Status: AC
  Administered 2016-02-06: 4 mg via INTRAVENOUS
  Filled 2016-02-06: qty 2

## 2016-02-06 MED ORDER — ONDANSETRON HCL 4 MG/2ML IJ SOLN: 4.0000 mg | Freq: Four times a day (QID) | INTRAMUSCULAR | Status: DC | PRN

## 2016-02-06 MED ORDER — METOCLOPRAMIDE HCL 5 MG/ML IJ SOLN
10.0000 mg | Freq: Once | INTRAMUSCULAR | Status: AC
  Administered 2016-02-06: 10 mg via INTRAVENOUS
  Filled 2016-02-06: qty 2

## 2016-02-06 MED ORDER — ONDANSETRON HCL 4 MG PO TABS: 4.0000 mg | ORAL_TABLET | Freq: Four times a day (QID) | ORAL | Status: DC | PRN

## 2016-02-06 MED ORDER — POTASSIUM CHLORIDE IN NACL 20-0.9 MEQ/L-% IV SOLN
INTRAVENOUS | Status: DC
  Administered 2016-02-06 – 2016-02-08 (×4): via INTRAVENOUS
  Filled 2016-02-06 (×6): qty 1000

## 2016-02-06 MED ORDER — HYDROMORPHONE HCL 1 MG/ML IJ SOLN
1.0000 mg | Freq: Once | INTRAMUSCULAR | Status: AC
  Administered 2016-02-06: 1 mg via INTRAVENOUS
  Filled 2016-02-06: qty 1

## 2016-02-06 MED ORDER — ALBUTEROL SULFATE (2.5 MG/3ML) 0.083% IN NEBU
5.0000 mg | INHALATION_SOLUTION | Freq: Once | RESPIRATORY_TRACT | Status: AC
  Administered 2016-02-06: 5 mg via RESPIRATORY_TRACT
  Filled 2016-02-06: qty 6

## 2016-02-06 MED ORDER — ALBUTEROL SULFATE (2.5 MG/3ML) 0.083% IN NEBU
2.5000 mg | INHALATION_SOLUTION | Freq: Four times a day (QID) | RESPIRATORY_TRACT | Status: DC
  Administered 2016-02-06 – 2016-02-08 (×11): 2.5 mg via RESPIRATORY_TRACT
  Filled 2016-02-06 (×11): qty 3

## 2016-02-06 MED ORDER — PREDNISONE 50 MG PO TABS
60.0000 mg | ORAL_TABLET | Freq: Every day | ORAL | Status: DC
  Administered 2016-02-06 – 2016-02-09 (×4): 60 mg via ORAL
  Filled 2016-02-06 (×3): qty 1
  Filled 2016-02-06: qty 3

## 2016-02-06 MED ORDER — PNEUMOCOCCAL VAC POLYVALENT 25 MCG/0.5ML IJ INJ
0.5000 mL | INJECTION | INTRAMUSCULAR | Status: AC
  Administered 2016-02-07: 0.5 mL via INTRAMUSCULAR
  Filled 2016-02-06: qty 0.5

## 2016-02-06 MED ORDER — ACETAMINOPHEN 650 MG RE SUPP: 650.0000 mg | Freq: Four times a day (QID) | RECTAL | Status: DC | PRN

## 2016-02-06 MED ORDER — ENOXAPARIN SODIUM 40 MG/0.4ML ~~LOC~~ SOLN
40.0000 mg | SUBCUTANEOUS | Status: DC
  Administered 2016-02-06 – 2016-02-08 (×3): 40 mg via SUBCUTANEOUS
  Filled 2016-02-06 (×2): qty 0.4

## 2016-02-06 NOTE — Progress Notes (Addendum)
PROGRESS NOTE  Yolanda White [id number] DOB: June 23, 1971 DOA: 02/06/2016 PCP: Default, Provider, MD  HPI/Recap of past 24 hours:  Persistent wheezing and intermittent cough, feeling nausea, no active vomiting, no diarrhea since admitted, no fever  Assessment/Plan: Principal Problem:   Asthma exacerbation Active Problems:   Cigarette smoker   Asthma  N/V/D: reported sick contact, possible viral. Will check stool pathogen pcr and c diff. Check KUB, start ivf, antiemetics.  Wheezing/cough: cxr no acute findings, not in respiratory distress, on room air, likely asthma exacerbation, will check flu pcr and respiratory viral panel, continue albuterol, steroids, iv mag x1.  Elevated cr at 1.14, possibly baseline ckd II, ua no acute finding, repeat cr in am after hydration.  Obesity: life style modification  Breast ca , s/p lumpectomy, currently under XRT, defer to outpatient oncology and xrt.   Code Status: full  Family Communication: patient   Disposition Plan: home in 1-2 days   Consultants:  none  Procedures:  none  Antibiotics:  none   Objective: BP 128/70 mmHg  Pulse 90  Temp(Src) 98.2 F (36.8 C) (Oral)  Resp 20  Ht 5\' 2"  (1.575 m)  Wt 110.252 kg (243 lb 1 oz)  BMI 44.45 kg/m2  SpO2 98% No intake or output data in the 24 hours ending 02/06/16 1556 Filed Weights   02/05/16 2220  Weight: 110.252 kg (243 lb 1 oz)    Exam:   General: obese, not very comfortable, not in distress  Cardiovascular: RRR  Respiratory: diffuse bilateral wheezing  Abdomen: Soft/ND/NT, positive BS  Musculoskeletal: No Edema  Neuro: aaox3  Data Reviewed: Basic Metabolic Panel:  Recent Labs Lab 02/05/16 2310  NA 138  K 3.7  CL 104  CO2 22  GLUCOSE 121*  BUN <5*  CREATININE 1.14*  CALCIUM 9.3   Liver Function Tests:  Recent Labs Lab 02/05/16 2310  AST 24  ALT 23  ALKPHOS 105  BILITOT 1.2  PROT 7.8  ALBUMIN 3.8    Recent Labs Lab  02/05/16 2310  LIPASE 25   No results for input(s): AMMONIA in the last 168 hours. CBC:  Recent Labs Lab 02/05/16 2310  WBC 10.9*  HGB 12.7  HCT 37.3  MCV 82.0  PLT 263   Cardiac Enzymes:   No results for input(s): CKTOTAL, CKMB, CKMBINDEX, TROPONINI in the last 168 hours. BNP (last 3 results) No results for input(s): BNP in the last 8760 hours.  ProBNP (last 3 results) No results for input(s): PROBNP in the last 8760 hours.  CBG: No results for input(s): GLUCAP in the last 168 hours.  No results found for this or any previous visit (from the past 240 hour(s)).   Studies: Dg Chest 2 View  02/06/2016  CLINICAL DATA:  Dyspnea and myalgias for 1 day. EXAM: CHEST  2 VIEW COMPARISON:  11/01/2015 FINDINGS: The heart size and mediastinal contours are within normal limits. Both lungs are clear. The visualized skeletal structures are unremarkable. IMPRESSION: No active cardiopulmonary disease. Electronically Signed   By: Andreas Newport M.D.   On: 02/06/2016 03:26   Ct Renal Stone Study  02/06/2016  CLINICAL DATA:  Nausea and vomiting since 1400 hours. Received radiation treatment for breast cancer today. Dysuria and back pain. EXAM: CT ABDOMEN AND PELVIS WITHOUT CONTRAST TECHNIQUE: Multidetector CT imaging of the abdomen and pelvis was performed following the standard protocol without IV contrast. COMPARISON:  Abdominal radiographs October 06, 2014 and CT abdomen and pelvis December 24, 2004 FINDINGS: LUNG BASES:  Subcentimeter pleural-based 4 mm nodule is likely benign, axial 9/31. The visualized heart and pericardium are unremarkable. KIDNEYS/BLADDER: Kidneys are orthotopic, demonstrating normal size and morphology. No nephrolithiasis, hydronephrosis; limited assessment for renal masses on this nonenhanced examination. The unopacified ureters are normal in course and caliber. Urinary bladder is partially distended and unremarkable. SOLID ORGANS: The liver, spleen, gallbladder, pancreas  and adrenal glands are unremarkable for this non-contrast examination. GASTROINTESTINAL TRACT: The stomach, small and large bowel are normal in course and caliber without inflammatory changes, the sensitivity may be decreased by lack of enteric contrast. Normal appendix. PERITONEUM/RETROPERITONEUM: Aortoiliac vessels are normal in course and caliber. No lymphadenopathy by CT size criteria. Status post hysterectomy. No intraperitoneal free fluid nor free air. SOFT TISSUES/ OSSEOUS STRUCTURES: Nonsuspicious. Focal sclerosis at LEFT sacroiliac joint compatible with osteoarthrosis, progressed from prior CT. IMPRESSION: No urolithiasis or obstructive uropathy. No acute intra-abdominal/ pelvic process. Electronically Signed   By: Elon Alas M.D.   On: 02/06/2016 02:27    Scheduled Meds: . albuterol  2.5 mg Nebulization Q6H  . enoxaparin (LOVENOX) injection  40 mg Subcutaneous Q24H  . guaiFENesin  600 mg Oral BID  . predniSONE  60 mg Oral Q breakfast    Continuous Infusions: . 0.9 % NaCl with KCl 20 mEq / L 75 mL/hr at 02/06/16 1551     Time spent: 46mins from 1pm to 1:25pm  Severn Goddard MD, PhD  [facility] Pager [phone]. If 7PM-7AM, please contact night-coverage at [email or web address], password [facility] 02/06/2016, 3:56 PM

## 2016-02-06 NOTE — ED Notes (Signed)
Pt became very relaxed after pain medication, Pt's O2 stats dropped to high 80's, placed on 2L of O2.

## 2016-02-06 NOTE — ED Notes (Signed)
Patient transported to X-ray 

## 2016-02-06 NOTE — H&P (Signed)
History and Physical  Patient Name: Yolanda White     [id number]    DOB: Sep 20, 1971    DOA: 02/06/2016 Referring physician: Malachy Moan, MD PCP: Default, Provider, MD      Chief Complaint: Cough and vomiting  HPI: Yolanda White is a 45 y.o. female with a past medical history significant for breast cancer currently getting radiation, intermittent asthma and allergic rhinitis who presents with cough and vomiting.  All the patient's family members have been sick with URI symptoms, and this afternoon after the patient's radiation therapy she started to cough and wheeze. She is out of her albuterol nebulizer at home, and so her coughing progressed over the course of the day. She initially complained of primarily nausea and vomiting to EDP, but to me she explains that she was coughing so much that she eventually gagged on her phlegm and vomited. She vomited 4 times NBNB emesis, and then developed abdominal cramping and so she came to the ER.    In the ED, she was afebrile, tachycardic, hemodynamically stable, and initially saturating well on room air.  CXR clear.  She was wheezing audibly, but got symptomatic relief from albuterol. Because of abdominal pain CT of the abdomen was ordered which was unremarkable.  Na 138, K 3.7, Cr 1.14.  WBC 10.9K, Hgb 12.7.  The patient had continued respiratory distress and intermittent hypoxia in the ER, and so TRH were asked to evaluate for observation.     Review of Systems:  Pt complains of cough, wheezing, chest tightness, abdominal cramps, shortness of breath. Pt denies any fever, chills, sputum.  All other systems negative except as just noted or noted in the history of present illness.  No Known Allergies  Prior to Admission medications   Medication Sig Start Date End Date Taking? Authorizing Provider  albuterol (VENTOLIN HFA) 108 (90 BASE) MCG/ACT inhaler Inhale 2 puffs into the lungs every 6 (six) hours as needed for wheezing or shortness  of breath. Reported on 11/16/2015   Yes Historical Provider, MD  oxyCODONE-acetaminophen (PERCOCET/ROXICET) 5-325 MG tablet Take 1 tablet by mouth every 4 (four) hours as needed. 01/30/16  Yes Thea Silversmith, MD    Past Medical History  Diagnosis Date  . Asthma   . Seasonal allergies   . Breast cancer of upper-inner quadrant of left female breast ([place]) 09/08/2015  . Breast cancer ([place])   . Anxiety   . Depression     Past Surgical History  Procedure Laterality Date  . Abdominal hysterectomy    . Tubal ligation    . Radioactive seed guided mastectomy with axillary sentinel lymph node biopsy Left 10/11/2015    Procedure: RADIOACTIVE SEED LOCALIZATION LEFT BREAST LUMPECTOMY WITH LEFT  AXILLARY SENTINEL LYMPH NODE BIOPSY;  Surgeon: Excell Seltzer, MD;  Location: [facility];  Service: General;  Laterality: Left;    Family history: family history includes Asthma in her brother and maternal aunt; Cancer in her paternal aunt; Diabetes in her brother, brother, father, and mother; Heart attack in her paternal grandmother; Lung cancer (age of onset: 68) in her father.  Social History: Patient lives with her daughters right now.  She does not work.  She smokes.         Physical Exam: BP 139/101 mmHg  Pulse 98  Temp(Src) 98.2 F (36.8 C) (Oral)  Resp 16  Ht 5\' 2"  (1.575 m)  Wt 110.252 kg (243 lb 1 oz)  BMI 44.45 kg/m2  SpO2 90% General appearance: Well-developed,  obese adult female, alert and in mild distress from dyspnea.   Eyes: Anicteric, conjunctiva pink, lids and lashes normal.     ENT: No nasal deformity, discharge, or epistaxis.  OP moist without lesions.   Lymph: No cervical or supraclavicular lymphadenopathy. Skin: Warm and dry.   Cardiac: RRR, nl S1-S2, no murmurs appreciated.  Capillary refill is brisk.  JVP normal.  No LE edema.  Radial and DP pulses 2+ and symmetric. Respiratory: Normal respiratory rate and rhythm.  Speaks in short sentences.  Audible  wheezing.  No rales or diminished breath sounds.   Abdomen: Abdomen soft without rigidity.  Minimal diffuse TTP. No ascites, distension.   MSK: No deformities or effusions. Neuro: Sensorium intact and responding to questions, attention normal.  Speech is fluent.  Moves all extremities equally and with normal coordination.    Psych: Behavior appropriate.  Affect normal.  No evidence of aural or visual hallucinations or delusions.       Labs on Admission:  The metabolic panel shows normal electrolytes and renal function. Transaminases and bilirubin are normal. Lipase is normal. The complete blood count shows minimal leukocytosis, no anemia or thrombocytopenia.   Radiological Exams on Admission: Personally reviewed: Dg Chest 2 View  02/06/2016  CLINICAL DATA:  Dyspnea and myalgias for 1 day. EXAM: CHEST  2 VIEW COMPARISON:  11/01/2015 FINDINGS: The heart size and mediastinal contours are within normal limits. Both lungs are clear. The visualized skeletal structures are unremarkable. IMPRESSION: No active cardiopulmonary disease. Electronically Signed   By: Andreas Newport M.D.   On: 02/06/2016 03:26   Ct Renal Stone Study  02/06/2016  CLINICAL DATA:  Nausea and vomiting since 1400 hours. Received radiation treatment for breast cancer today. Dysuria and back pain. EXAM: CT ABDOMEN AND PELVIS WITHOUT CONTRAST TECHNIQUE: Multidetector CT imaging of the abdomen and pelvis was performed following the standard protocol without IV contrast. COMPARISON:  Abdominal radiographs October 06, 2014 and CT abdomen and pelvis December 24, 2004 FINDINGS: LUNG BASES: Subcentimeter pleural-based 4 mm nodule is likely benign, axial 9/31. The visualized heart and pericardium are unremarkable. KIDNEYS/BLADDER: Kidneys are orthotopic, demonstrating normal size and morphology. No nephrolithiasis, hydronephrosis; limited assessment for renal masses on this nonenhanced examination. The unopacified ureters are normal in  course and caliber. Urinary bladder is partially distended and unremarkable. SOLID ORGANS: The liver, spleen, gallbladder, pancreas and adrenal glands are unremarkable for this non-contrast examination. GASTROINTESTINAL TRACT: The stomach, small and large bowel are normal in course and caliber without inflammatory changes, the sensitivity may be decreased by lack of enteric contrast. Normal appendix. PERITONEUM/RETROPERITONEUM: Aortoiliac vessels are normal in course and caliber. No lymphadenopathy by CT size criteria. Status post hysterectomy. No intraperitoneal free fluid nor free air. SOFT TISSUES/ OSSEOUS STRUCTURES: Nonsuspicious. Focal sclerosis at LEFT sacroiliac joint compatible with osteoarthrosis, progressed from prior CT. IMPRESSION: No urolithiasis or obstructive uropathy. No acute intra-abdominal/ pelvic process. Electronically Signed   By: Elon Alas M.D.   On: 02/06/2016 02:27       Assessment/Plan 1. Asthma exacerbation:  This is new.  Patient has seasonal allergies, and appears to have a viral trigger for this episode.   -Prednisone 60 mg, plan 12 day taper 3 days at a time -Albuterol scheduled and when necessary -Supplemental oxygen as necessary   2. History of breast cancer:  -Continue oxycodone acetaminophen as needed  3. Vomiting:  From history, this is post-tussive. -Ondansetron as needed      DVT PPx: Lovenox Diet: Regular Consultants: None  Code Status: Full Family Communication: None present  Medical decision making: What exists of the patient's previous chart was reviewed in depth and the case was discussed with Dr Betsey Holiday. Patient seen 6:50 AM on 02/06/2016.  Disposition Plan:  I recommend admission to med surg, obsevation status.  Clinical condition: stable.  Anticipate bronchodilators scheduled overnight, begin prednisone taper.  If SpO2 maintains > 92% and patient able to obtain albuterol refills (she is out) and prednisone, anticipate discharge to  home tomorrow morning.      Edwin Dada [facility] Pager [phone]

## 2016-02-06 NOTE — ED Provider Notes (Signed)
CSN: [id number]     Arrival date & time 02/05/16  2203 History  By signing my name below, I, Evelene Croon, attest that this documentation has been prepared under the direction and in the presence of Orpah Greek, MD . Electronically Signed: Evelene Croon, Scribe. 02/06/2016. 12:38 AM.    Chief Complaint  Patient presents with  . Nausea  . Emesis    The history is provided by the patient. No language interpreter was used.     HPI Comments:  Yolanda White is a 45 y.o. female with a history of breast cancer who presents to the Emergency Department complaining of nausea and multiple episodes of vomiting since ~ 1400 today. Pt is currently receiving treatment for breast CA (mon-fri), She states in the past she has experienced nausea and vomiting afterward but not as bad as today. She also reports associated back pain and right sided abdominal pain with cough or when vomiting. No alleviating factors noted. She reports difficulty tolerating PO. She also notes recent sick contacts at home (grandchildren).   Past Medical History  Diagnosis Date  . Asthma   . Seasonal allergies   . 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 Breast cancer ([place])   . Anxiety   . Depression    Past Surgical History  Procedure Laterality Date  . Abdominal hysterectomy    . Tubal ligation    . Radioactive seed guided mastectomy with axillary sentinel lymph node biopsy Left 10/11/2015    Procedure: RADIOACTIVE SEED LOCALIZATION LEFT BREAST LUMPECTOMY WITH LEFT  AXILLARY SENTINEL LYMPH NODE BIOPSY;  Surgeon: Excell Seltzer, MD;  Location: [facility];  Service: General;  Laterality: Left;   Family History  Problem Relation Age of Onset  . Diabetes Mother   . Diabetes Father   . Lung cancer Father 54    smoker  . Cancer Paternal Aunt     cervical  . Diabetes Brother   . Heart attack Paternal Grandmother     in her 67s  . Diabetes Brother      maternal half brother  . Asthma Brother   . Asthma Maternal Aunt    Social History  Substance Use Topics  . Smoking status: Current Every Day Smoker -- 0.50 packs/day for 34 years    Types: Cigarettes  . Smokeless tobacco: Never Used  . Alcohol Use: No   OB History    Gravida Para Term Preterm AB TAB SAB Ectopic Multiple Living   4 3 3  1     3      Review of Systems  Constitutional: Negative for fever.  Gastrointestinal: Positive for nausea, vomiting and abdominal pain.  Musculoskeletal: Positive for back pain.  All other systems reviewed and are negative.  Allergies  Review of patient's allergies indicates no known allergies.  Home Medications   Prior to Admission medications   Medication Sig Start Date End Date Taking? Authorizing Provider  acetaminophen (TYLENOL) 500 MG tablet Take 1,000 mg by mouth every 6 (six) hours as needed for moderate pain.     Historical Provider, MD  albuterol (PROVENTIL) (2.5 MG/3ML) 0.083% nebulizer solution Take 2.5 mg by nebulization every 6 (six) hours as needed for wheezing or shortness of breath.    Historical Provider, MD  albuterol (VENTOLIN HFA) 108 (90 BASE) MCG/ACT inhaler Inhale 2 puffs into the lungs every 6 (six) hours as needed for wheezing or shortness of breath. Reported on 11/16/2015  Historical Provider, MD  calcium carbonate (TUMS - DOSED IN MG ELEMENTAL CALCIUM) 500 MG chewable tablet Chew 1-2 tablets by mouth daily as needed for indigestion or heartburn.     Historical Provider, MD  ibuprofen (ADVIL,MOTRIN) 800 MG tablet Take 800 mg by mouth every 8 (eight) hours as needed. Reported on 01/30/2016    Nolene Ebbs, MD  oxyCODONE-acetaminophen (PERCOCET/ROXICET) 5-325 MG tablet Take 1 tablet by mouth every 4 (four) hours as needed. 01/30/16   Thea Silversmith, MD  ranitidine (ZANTAC) 150 MG tablet Take 150 mg by mouth daily as needed for heartburn.    Historical Provider, MD   BP 122/71 mmHg  Pulse 110  Temp(Src) 98.2 F (36.8 C)  (Oral)  Resp 16  Ht 5\' 2"  (1.575 m)  Wt 243 lb 1 oz (110.252 kg)  BMI 44.45 kg/m2  SpO2 100% Physical Exam  Constitutional: She is oriented to person, place, and time. She appears well-developed and well-nourished. No distress.  HENT:  Head: Normocephalic and atraumatic.  Right Ear: Hearing normal.  Left Ear: Hearing normal.  Nose: Nose normal.  Mouth/Throat: Oropharynx is clear and moist and mucous membranes are normal.  Eyes: Conjunctivae and EOM are normal. Pupils are equal, round, and reactive to light.  Neck: Normal range of motion. Neck supple.  Cardiovascular: Regular rhythm, S1 normal and S2 normal.  Exam reveals no gallop and no friction rub.   No murmur heard. Pulmonary/Chest: Effort normal and breath sounds normal. No respiratory distress. She exhibits no tenderness.  Abdominal: Soft. Normal appearance and bowel sounds are normal. There is no hepatosplenomegaly. There is no tenderness. There is no rebound, no guarding, no tenderness at McBurney's point and negative Murphy's sign. No hernia.  Musculoskeletal: Normal range of motion.  Diffuse lumbar tenderness and pain with ROM   Neurological: She is alert and oriented to person, place, and time. She has normal strength. No cranial nerve deficit or sensory deficit. Coordination normal. GCS eye subscore is 4. GCS verbal subscore is 5. GCS motor subscore is 6.  Skin: Skin is warm, dry and intact. No rash noted. No cyanosis.  Psychiatric: She has a normal mood and affect. Her speech is normal and behavior is normal. Thought content normal.  Nursing note and vitals reviewed.   ED Course  Procedures   DIAGNOSTIC STUDIES:  Oxygen Saturation is 100% on RA, normal by my interpretation.    COORDINATION OF CARE:  12:37 AM Discussed treatment plan with pt at bedside and pt agreed to plan.  Labs Review Labs Reviewed  COMPREHENSIVE METABOLIC PANEL - Abnormal; Notable for the following:    Glucose, Bld 121 (*)    BUN <5 (*)     Creatinine, Ser 1.14 (*)    GFR calc non Af Amer 58 (*)    All other components within normal limits  CBC - Abnormal; Notable for the following:    WBC 10.9 (*)    All other components within normal limits  URINALYSIS, ROUTINE W REFLEX MICROSCOPIC (NOT AT [facility]) - Abnormal; Notable for the following:    APPearance CLOUDY (*)    All other components within normal limits  LIPASE, BLOOD    Imaging Review No results found. I have personally reviewed and evaluated these images and lab results as part of my medical decision-making.   EKG Interpretation None      MDM   Final diagnoses:  None  bronchospasm Low back pain n/v  Patient presented to the ER for evaluation of nausea and  vomiting. Patient reports that she has been undergoing radiation therapy for breast cancer. She is not currently receiving chemotherapy. She did have onset of low back pain this evening as well as the nausea and vomiting. She has not had any urinary symptoms. Examination did reveal diffuse lower back tenderness, no CVA tenderness. Pain does appear to be musculoskeletal, as it worsens with movement. She does not have any lower extremity numbness, tingling or weakness.  CT abdomen and pelvis performed and does not show any evidence of ureterolithiasis, aortic abnormality, etc.  Patient was administered IV fluids and antiemetics with improvement of her nausea and vomiting but continued to have low back pain. She was administered Dilaudid for this and the back pain did improve but she became somewhat somnolent. Her oxygenation did drop some time after the Dilaudid, but upon reevaluation she also has active wheezing. Patient's cough has been progressively worsening here in the ER and she has been experiencing significant proximal symptoms of cough associated with the wheeze and shortness of breath. Some of her hypoxia is likely secondary to bronchospasm from upper respiratory infection. Chest x-ray, however, did not show  any evidence of pneumonia. Patient will require hospitalization for further management.  I personally performed the services described in this documentation, which was scribed in my presence. The recorded information has been reviewed and is accurate.    Orpah Greek, MD 02/06/16 [phone]

## 2016-02-07 ENCOUNTER — Ambulatory Visit: Payer: Medicaid Other

## 2016-02-07 ENCOUNTER — Telehealth: Payer: Self-pay | Admitting: *Deleted

## 2016-02-07 ENCOUNTER — Ambulatory Visit
Admit: 2016-02-07 | Discharge: 2016-02-07 | Disposition: A | Discharge status: Home / Self Care | Payer: Medicaid Other | Attending: Radiation Oncology | Admitting: Radiation Oncology

## 2016-02-07 DIAGNOSIS — J45901 Unspecified asthma with (acute) exacerbation: Secondary | ICD-10-CM | POA: Diagnosis not present

## 2016-02-07 DIAGNOSIS — Z6841 Body Mass Index (BMI) 40.0 and over, adult: Secondary | ICD-10-CM | POA: Diagnosis not present

## 2016-02-07 DIAGNOSIS — Z23 Encounter for immunization: Secondary | ICD-10-CM | POA: Diagnosis not present

## 2016-02-07 DIAGNOSIS — C50212 Malignant neoplasm of upper-inner quadrant of left female breast: Secondary | ICD-10-CM | POA: Diagnosis present

## 2016-02-07 DIAGNOSIS — R05 Cough: Secondary | ICD-10-CM | POA: Diagnosis present

## 2016-02-07 DIAGNOSIS — K5900 Constipation, unspecified: Secondary | ICD-10-CM | POA: Diagnosis not present

## 2016-02-07 DIAGNOSIS — A09 Infectious gastroenteritis and colitis, unspecified: Secondary | ICD-10-CM | POA: Diagnosis present

## 2016-02-07 DIAGNOSIS — F17210 Nicotine dependence, cigarettes, uncomplicated: Secondary | ICD-10-CM | POA: Diagnosis present

## 2016-02-07 DIAGNOSIS — J4521 Mild intermittent asthma with (acute) exacerbation: Secondary | ICD-10-CM | POA: Diagnosis present

## 2016-02-07 DIAGNOSIS — Z720 Tobacco use: Secondary | ICD-10-CM | POA: Diagnosis not present

## 2016-02-07 DIAGNOSIS — N182 Chronic kidney disease, stage 2 (mild): Secondary | ICD-10-CM | POA: Diagnosis present

## 2016-02-07 LAB — CBC
HCT: 34.2 % — ABNORMAL LOW (ref 36.0–46.0)
Hemoglobin: 11.8 g/dL — ABNORMAL LOW (ref 12.0–15.0)
MCH: 28.7 pg (ref 26.0–34.0)
MCHC: 34.5 g/dL (ref 30.0–36.0)
MCV: 83.2 fL (ref 78.0–100.0)
PLATELETS: 219 10*3/uL (ref 150–400)
RBC: 4.11 MIL/uL (ref 3.87–5.11)
RDW: 13.3 % (ref 11.5–15.5)
WBC: 9.9 10*3/uL (ref 4.0–10.5)

## 2016-02-07 LAB — BASIC METABOLIC PANEL
ANION GAP: 8 (ref 5–15)
BUN: <5 mg/dL — ABNORMAL LOW (ref 6–20)
CALCIUM: 8.5 mg/dL — AB (ref 8.9–10.3)
CO2: 22 mmol/L (ref 22–32)
CREATININE: 0.96 mg/dL (ref 0.44–1.00)
Chloride: 110 mmol/L (ref 101–111)
Glucose, Bld: 110 mg/dL — ABNORMAL HIGH (ref 65–99)
Potassium: 4.1 mmol/L (ref 3.5–5.1)
SODIUM: 140 mmol/L (ref 135–145)

## 2016-02-07 LAB — TSH: TSH: 2.531 u[IU]/mL (ref 0.350–4.500)

## 2016-02-07 LAB — MAGNESIUM: Magnesium: 2.1 mg/dL (ref 1.7–2.4)

## 2016-02-07 MED ORDER — BENZONATATE 100 MG PO CAPS
200.0000 mg | ORAL_CAPSULE | Freq: Three times a day (TID) | ORAL | Status: DC | PRN
  Administered 2016-02-07: 200 mg via ORAL
  Filled 2016-02-07: qty 2

## 2016-02-07 MED ORDER — BREATHING FREE BOOK
Freq: Once | Status: AC
  Administered 2016-02-07: 05:00:00
  Filled 2016-02-07 (×2): qty 1

## 2016-02-07 MED ORDER — BREATHING FREE BOOK
Freq: Once | Status: DC
  Filled 2016-02-07: qty 1

## 2016-02-07 NOTE — Telephone Encounter (Signed)
Called to see how she is doing since her admission to [facility] on Monday afternoon.  Ms. Schechter said the doctor feels that the pollen triggered an asthma attack and she was dehydrated.  She is feeling better today waiting for her doctor to make rounds this morning.  I will check back with her after 12 noon to see if she will be discharged. L2 made aware of the above conversation.

## 2016-02-07 NOTE — Progress Notes (Signed)
[facility] PROGRESS NOTE  Yolanda White [id number] DOB: 02-20-1971 DOA: 02/06/2016 PCP: Default, Provider, MD  HPI/Brief narrative 45 y.o. female with a past medical history significant for breast cancer currently getting radiation, intermittent asthma and allergic rhinitis who presents with cough and vomiting  Assessment/Plan: N/V/D:multiple close sick contacts, suspect infectious gastroenteritis. Checking stool pathogen pcr and c diff. Continue ivf, antiemetics. This afternoon, pt continues to complain of nausea. Have downgraded diet to full liquid. Per RN, pt did tolerate liquids. Will slowly advance to soft diet.  Wheezing/cough: cxr no acute findings, not in respiratory distress, on room air, likely asthma exacerbation. Lungs clear currently. No wheezing.  Elevated cr at 1.14, possibly baseline ckd II  Obesity: life style modification  Breast ca , s/p lumpectomy, currently under XRT, defer to outpatient oncology and xrt.   Code Status: Full Family Communication: Pt in room Disposition Plan: Possible d/c in 24hrs   Consultants:    Procedures:    Antibiotics: Anti-infectives    None      HPI/Subjective: Feels somewhat better today, however still complains of mild nausea  Objective: Filed Vitals:   02/07/16 0521 02/07/16 1000 02/07/16 1503 02/07/16 1508  BP: 103/74  121/66   Pulse: 77  94   Temp: 98.1 F (36.7 C)  98 F (36.7 C)   TempSrc: Oral  Oral   Resp: 16  20   Height:      Weight:      SpO2: 93% 98% 96% 95%    Intake/Output Summary (Last 24 hours) at 02/07/16 1754 Last data filed at 02/07/16 1014  Gross per 24 hour  Intake 1856.25 ml  Output    900 ml  Net 956.25 ml   Filed Weights   02/05/16 2220  Weight: 110.252 kg (243 lb 1 oz)    Exam:   General:  Awake, in nad  Cardiovascular: regular, s1, s2  Respiratory: normal resp effort, no wheezing  Abdomen: soft, nondistended  Musculoskeletal: perfused, no clubbing    Data Reviewed: Basic Metabolic Panel:  Recent Labs Lab 02/05/16 2310 02/06/16 1607 02/07/16 0838  NA 138  --  140  K 3.7  --  4.1  CL 104  --  110  CO2 22  --  22  GLUCOSE 121*  --  110*  BUN <5*  --  <5*  CREATININE 1.14* 1.17* 0.96  CALCIUM 9.3  --  8.5*  MG  --   --  2.1   Liver Function Tests:  Recent Labs Lab 02/05/16 2310  AST 24  ALT 23  ALKPHOS 105  BILITOT 1.2  PROT 7.8  ALBUMIN 3.8    Recent Labs Lab 02/05/16 2310  LIPASE 25   No results for input(s): AMMONIA in the last 168 hours. CBC:  Recent Labs Lab 02/05/16 2310 02/06/16 1607 02/07/16 0838  WBC 10.9* 7.1 9.9  HGB 12.7 12.5 11.8*  HCT 37.3 35.9* 34.2*  MCV 82.0 82.5 83.2  PLT 263 262 219   Cardiac Enzymes: No results for input(s): CKTOTAL, CKMB, CKMBINDEX, TROPONINI in the last 168 hours. BNP (last 3 results) No results for input(s): BNP in the last 8760 hours.  ProBNP (last 3 results) No results for input(s): PROBNP in the last 8760 hours.  CBG: No results for input(s): GLUCAP in the last 168 hours.  Recent Results (from the past 240 hour(s))  MRSA PCR Screening     Status: None   Collection Time: 02/06/16  5:34 PM  Result Value Ref Range  Status   MRSA by PCR NEGATIVE NEGATIVE Final    Comment:        The GeneXpert MRSA Assay (FDA approved for NASAL specimens only), is one component of a comprehensive MRSA colonization surveillance program. It is not intended to diagnose MRSA infection nor to guide or monitor treatment for MRSA infections.      Studies: Dg Chest 2 View  02/06/2016  CLINICAL DATA:  Dyspnea and myalgias for 1 day. EXAM: CHEST  2 VIEW COMPARISON:  11/01/2015 FINDINGS: The heart size and mediastinal contours are within normal limits. Both lungs are clear. The visualized skeletal structures are unremarkable. IMPRESSION: No active cardiopulmonary disease. Electronically Signed   By: Andreas Newport M.D.   On: 02/06/2016 03:26   Dg Abd Portable  1v  02/06/2016  CLINICAL DATA:  Nausea vomiting and right lower abdominal pain, onset yesterday EXAM: PORTABLE ABDOMEN - 1 VIEW COMPARISON:  None. FINDINGS: Three portable views of the abdomen demonstrate an unremarkable gas pattern. No evidence of obstruction or perforation. IMPRESSION: Negative for obstruction or perforation. Electronically Signed   By: Andreas Newport M.D.   On: 02/06/2016 22:11   Ct Renal Stone Study  02/06/2016  CLINICAL DATA:  Nausea and vomiting since 1400 hours. Received radiation treatment for breast cancer today. Dysuria and back pain. EXAM: CT ABDOMEN AND PELVIS WITHOUT CONTRAST TECHNIQUE: Multidetector CT imaging of the abdomen and pelvis was performed following the standard protocol without IV contrast. COMPARISON:  Abdominal radiographs October 06, 2014 and CT abdomen and pelvis December 24, 2004 FINDINGS: LUNG BASES: Subcentimeter pleural-based 4 mm nodule is likely benign, axial 9/31. The visualized heart and pericardium are unremarkable. KIDNEYS/BLADDER: Kidneys are orthotopic, demonstrating normal size and morphology. No nephrolithiasis, hydronephrosis; limited assessment for renal masses on this nonenhanced examination. The unopacified ureters are normal in course and caliber. Urinary bladder is partially distended and unremarkable. SOLID ORGANS: The liver, spleen, gallbladder, pancreas and adrenal glands are unremarkable for this non-contrast examination. GASTROINTESTINAL TRACT: The stomach, small and large bowel are normal in course and caliber without inflammatory changes, the sensitivity may be decreased by lack of enteric contrast. Normal appendix. PERITONEUM/RETROPERITONEUM: Aortoiliac vessels are normal in course and caliber. No lymphadenopathy by CT size criteria. Status post hysterectomy. No intraperitoneal free fluid nor free air. SOFT TISSUES/ OSSEOUS STRUCTURES: Nonsuspicious. Focal sclerosis at LEFT sacroiliac joint compatible with osteoarthrosis, progressed  from prior CT. IMPRESSION: No urolithiasis or obstructive uropathy. No acute intra-abdominal/ pelvic process. Electronically Signed   By: Elon Alas M.D.   On: 02/06/2016 02:27    Scheduled Meds: . albuterol  2.5 mg Nebulization Q6H  . breathing free book   Does not apply Once  . enoxaparin (LOVENOX) injection  40 mg Subcutaneous Q24H  . guaiFENesin  600 mg Oral BID  . predniSONE  60 mg Oral Q breakfast   Continuous Infusions: . 0.9 % NaCl with KCl 20 mEq / L 75 mL/hr at 02/07/16 0700    Principal Problem:   Asthma exacerbation Active Problems:   Cigarette smoker   Asthma   Emesis   Morbid obesity ([place])    Baldomero Mirarchi, [place] K  [facility] Pager [phone]. If 7PM-7AM, please contact night-coverage at [email or web address], password [facility] 02/07/2016, 5:54 PM  LOS: 0 days

## 2016-02-08 ENCOUNTER — Telehealth: Payer: Self-pay | Admitting: *Deleted

## 2016-02-08 ENCOUNTER — Inpatient Hospital Stay (HOSPITAL_COMMUNITY): Payer: Medicaid Other

## 2016-02-08 ENCOUNTER — Ambulatory Visit: Payer: Medicaid Other

## 2016-02-08 DIAGNOSIS — Z720 Tobacco use: Secondary | ICD-10-CM

## 2016-02-08 LAB — RESPIRATORY VIRUS PANEL
Adenovirus: NEGATIVE
INFLUENZA A: NEGATIVE
INFLUENZA B 1: NEGATIVE
Metapneumovirus: NEGATIVE
PARAINFLUENZA 2 A: NEGATIVE
PARAINFLUENZA 3 A: NEGATIVE
Parainfluenza 1: NEGATIVE
RESPIRATORY SYNCYTIAL VIRUS B: NEGATIVE
Respiratory Syncytial Virus A: NEGATIVE
Rhinovirus: NEGATIVE

## 2016-02-08 LAB — C DIFFICILE QUICK SCREEN W PCR REFLEX
C DIFFICILE (CDIFF) TOXIN: NEGATIVE
C DIFFICLE (CDIFF) ANTIGEN: NEGATIVE
C Diff interpretation: NEGATIVE

## 2016-02-08 MED ORDER — ALBUTEROL SULFATE (2.5 MG/3ML) 0.083% IN NEBU
2.5000 mg | INHALATION_SOLUTION | Freq: Two times a day (BID) | RESPIRATORY_TRACT | Status: DC
Start: 2016-02-09 — End: 2016-02-09
  Administered 2016-02-09: 2.5 mg via RESPIRATORY_TRACT
  Filled 2016-02-08: qty 3

## 2016-02-08 MED ORDER — SORBITOL 70 % SOLN
960.0000 mL | TOPICAL_OIL | Freq: Once | ORAL | Status: DC
  Filled 2016-02-08: qty 240

## 2016-02-08 MED ORDER — SORBITOL 70 % SOLN
960.0000 mL | TOPICAL_OIL | ORAL | Status: AC
  Administered 2016-02-08: 960 mL via RECTAL
  Filled 2016-02-08: qty 240

## 2016-02-08 MED ORDER — MAGNESIUM CITRATE PO SOLN
1.0000 | Freq: Once | ORAL | Status: AC
  Administered 2016-02-08: 1 via ORAL
  Filled 2016-02-08: qty 296

## 2016-02-08 NOTE — Telephone Encounter (Signed)
Spoke with Yolanda White remains an inpatient at [facility] thinks she will get to go home tomorrow.  Will speak with her again in the morning to get a possible discharge time. L2 treatment team made aware of the above conversation.

## 2016-02-08 NOTE — Progress Notes (Addendum)
Patient received soap sud enema and tolerated it well.  Patient is currently trying to have a BM will update with results.   2:23 PM patient had a Bm that was moderate in size.  It consisted of chunks of stool

## 2016-02-08 NOTE — Telephone Encounter (Signed)
Spoke with Yolanda White she is still an inatient. At [facility] And will not be released until she has a bowel movement.  Will not be here today for her radiation treatment today.  Will call us today after her doctor makes rounds and let us know the status of her treatment plan.  L2 made aware of the above discussion.

## 2016-02-08 NOTE — Progress Notes (Signed)
[facility] PROGRESS NOTE  Yolanda White [id number] DOB: [birth year] DOA: 02/06/2016 PCP: Default, Provider, MD  HPI/Brief narrative 45 y.o. female with a past medical history significant for breast cancer currently getting radiation, intermittent asthma and allergic rhinitis who presents with cough and vomiting  Assessment/Plan: N/V/D:multiple close sick contacts, suspect infectious gastroenteritis. Checking stool pathogen pcr. Cdiff neg. Continue ivf, antiemetics. Tolerating soft diet, however, complains of continued generalized abd pain. No BM overnight.  Wheezing/cough: cxr no acute findings, not in respiratory distress, on room air, likely asthma exacerbation. Lungs clear currently. No wheezing.  Elevated cr at 1.14, suspected baseline ckd II  Obesity: life style modification  Breast ca, s/p lumpectomy, currently under XRT, defer to outpatient oncology and xrt.   Constipation: Xray abd demonstrated stool throughout colon with no signs of obstruction. Pt does have bowel sounds and is passing flatus. Trying multiple cathartics with some success. Patient still reports abd pain  Code Status: Full Family Communication: Pt in room Disposition Plan: Possible d/c home in 24hrs   Consultants:    Procedures:    Antibiotics: Anti-infectives    None      HPI/Subjective: Complains of constipation and asking for bowel regimen  Objective: Filed Vitals:   02/08/16 0130 02/08/16 0611 02/08/16 0819 02/08/16 1429  BP:  117/63  116/67  Pulse:  75 70 68  Temp:  98.7 F (37.1 C)  98.4 F (36.9 C)  TempSrc:  Oral  Oral  Resp:  17 16 18   Height:      Weight:      SpO2: 94% 95% 95% 100%    Intake/Output Summary (Last 24 hours) at 02/08/16 1816 Last data filed at 02/08/16 1437  Gross per 24 hour  Intake   1920 ml  Output    250 ml  Net   1670 ml   Filed Weights   02/05/16 2220  Weight: 110.252 kg (243 lb 1 oz)    Exam:   General:  Awake, in nad, laying  in bed  Cardiovascular: regular, s1, s2  Respiratory: normal resp effort, no wheezing  Abdomen: soft, pos BS, generally tender  Musculoskeletal: perfused, no clubbing, no cyanosis  Data Reviewed: Basic Metabolic Panel:  Recent Labs Lab 02/05/16 2310 02/06/16 1607 02/07/16 0838  NA 138  --  140  K 3.7  --  4.1  CL 104  --  110  CO2 22  --  22  GLUCOSE 121*  --  110*  BUN <5*  --  <5*  CREATININE 1.14* 1.17* 0.96  CALCIUM 9.3  --  8.5*  MG  --   --  2.1   Liver Function Tests:  Recent Labs Lab 02/05/16 2310  AST 24  ALT 23  ALKPHOS 105  BILITOT 1.2  PROT 7.8  ALBUMIN 3.8    Recent Labs Lab 02/05/16 2310  LIPASE 25   No results for input(s): AMMONIA in the last 168 hours. CBC:  Recent Labs Lab 02/05/16 2310 02/06/16 1607 02/07/16 0838  WBC 10.9* 7.1 9.9  HGB 12.7 12.5 11.8*  HCT 37.3 35.9* 34.2*  MCV 82.0 82.5 83.2  PLT 263 262 219   Cardiac Enzymes: No results for input(s): CKTOTAL, CKMB, CKMBINDEX, TROPONINI in the last 168 hours. BNP (last 3 results) No results for input(s): BNP in the last 8760 hours.  ProBNP (last 3 results) No results for input(s): PROBNP in the last 8760 hours.  CBG: No results for input(s): GLUCAP in the last 168 hours.  Recent Results (  from the past 240 hour(s))  MRSA PCR Screening     Status: None   Collection Time: 02/06/16  5:34 PM  Result Value Ref Range Status   MRSA by PCR NEGATIVE NEGATIVE Final    Comment:        The GeneXpert MRSA Assay (FDA approved for NASAL specimens only), is one component of a comprehensive MRSA colonization surveillance program. It is not intended to diagnose MRSA infection nor to guide or monitor treatment for MRSA infections.   C difficile quick scan w PCR reflex     Status: None   Collection Time: 02/08/16  2:24 PM  Result Value Ref Range Status   C Diff antigen NEGATIVE NEGATIVE Final   C Diff toxin NEGATIVE NEGATIVE Final   C Diff interpretation Negative for  toxigenic C. difficile  Final     Studies: Dg Abd 1 View  02/08/2016  CLINICAL DATA:  Constipation.  Colon obstruction. EXAM: ABDOMEN - 1 VIEW COMPARISON:  02/06/2016 abdominal radiograph. FINDINGS: No dilated small bowel loops. Moderate stool volume throughout the colon, increased. No evidence of pneumatosis or pneumoperitoneum. No pathologic soft tissue calcifications. IMPRESSION: Nonobstructive bowel gas pattern. Moderate diffuse colonic stool volume, increased, suggesting constipation. Electronically Signed   By: Ilona Sorrel M.D.   On: 02/08/2016 11:24   Dg Abd Portable 1v  02/06/2016  CLINICAL DATA:  Nausea vomiting and right lower abdominal pain, onset yesterday EXAM: PORTABLE ABDOMEN - 1 VIEW COMPARISON:  None. FINDINGS: Three portable views of the abdomen demonstrate an unremarkable gas pattern. No evidence of obstruction or perforation. IMPRESSION: Negative for obstruction or perforation. Electronically Signed   By: Andreas Newport M.D.   On: 02/06/2016 22:11    Scheduled Meds: . albuterol  2.5 mg Nebulization Q6H  . breathing free book   Does not apply Once  . enoxaparin (LOVENOX) injection  40 mg Subcutaneous Q24H  . guaiFENesin  600 mg Oral BID  . predniSONE  60 mg Oral Q breakfast   Continuous Infusions:    Principal Problem:   Asthma exacerbation Active Problems:   Cigarette smoker   Asthma   Emesis   Morbid obesity ([place])    Marianne Golightly K  [facility] Pager [phone]. If 7PM-7AM, please contact night-coverage at [email or web address], password [facility] 02/08/2016, 6:16 PM  LOS: 1 day

## 2016-02-09 ENCOUNTER — Ambulatory Visit: Payer: Medicaid Other

## 2016-02-09 ENCOUNTER — Telehealth: Payer: Self-pay | Admitting: *Deleted

## 2016-02-09 LAB — GASTROINTESTINAL PANEL BY PCR, STOOL (REPLACES STOOL CULTURE)

## 2016-02-09 MED ORDER — DOCUSATE SODIUM 100 MG PO CAPS: 100.0000 mg | ORAL_CAPSULE | Freq: Two times a day (BID) | ORAL | Status: DC

## 2016-02-09 MED ORDER — KETOROLAC TROMETHAMINE 10 MG PO TABS: 10.0000 mg | ORAL_TABLET | Freq: Four times a day (QID) | ORAL | Status: DC | PRN

## 2016-02-09 MED ORDER — ALBUTEROL SULFATE (2.5 MG/3ML) 0.083% IN NEBU: 2.5000 mg | INHALATION_SOLUTION | Freq: Four times a day (QID) | RESPIRATORY_TRACT | Status: DC | PRN

## 2016-02-09 MED ORDER — POLYETHYLENE GLYCOL 3350 17 G PO PACK: 17.0000 g | PACK | Freq: Every day | ORAL | Status: DC

## 2016-02-09 MED ORDER — PREDNISONE 5 MG PO TABS: 5.0000 mg | ORAL_TABLET | Freq: Every day | ORAL | Status: DC

## 2016-02-09 NOTE — Care Management Note (Signed)
Case Management Note  Patient Details  Name: Yolanda White MRN: [id number] Date of Birth: 1971-02-03  Subjective/Objective:                 Admitted with Asthma exacerbation. Lives with fiance'. Independent with ADL's PTA. No DME usage.  PCP: Dr.Ewin Avbuere.   Action/Plan: Plan to d/c to home today  Expected Discharge Date:   02/09/2016            Expected Discharge Plan:  Home/Self Care  In-House Referral:     Discharge planning Services  CM Consult  Post Acute Care Choice:    Choice offered to:     DME Arranged:    DME Agency:     HH Arranged:    [place] Agency:     Status of Service:  Completed, signed off  Medicare Important Message Given:    Date Medicare IM Given:    Medicare IM give by:    Date Additional Medicare IM Given:    Additional Medicare Important Message give by:     If discussed at [place] of Stay Meetings, dates discussed:    Additional Comments:  Sharin Mons, [place] [phone] 02/09/2016, 10:30 AM

## 2016-02-09 NOTE — Discharge Summary (Signed)
Physician Discharge Summary  Yolanda White [id number] DOB: 1971-10-06 DOA: 02/06/2016  PCP: Default, Provider, MD  Admit date: 02/06/2016 Discharge date: 02/09/2016  Time spent: 20 minutes  Recommendations for Outpatient Follow-up:  1. Follow up with PCP in 2-3 weeks   Discharge Diagnoses:  Principal Problem:   Asthma exacerbation Active Problems:   Cigarette smoker   Asthma   Emesis   Morbid obesity ([place])   Discharge Condition: Improved  Diet recommendation: Regular  Filed Weights   02/05/16 2220  Weight: 110.252 kg (243 lb 1 oz)    History of present illness:  Please review dictated H and P from 4/11 for details. Briefly, 45 y.o. female with a past medical history significant for breast cancer currently getting radiation, intermittent asthma and allergic rhinitis who presents with cough and vomiting  Hospital Course:  N/V/D: multiple close sick contacts thus consideration for possible infectious gastroenteritis. Stool pathogen pcr neg. Cdiff neg. Xray abd performed which demonstrated large stool throughout colon. On further questioning, pt reports normally not having BM weeks at a time. See below  Wheezing/cough: cxr no acute findings, not in respiratory distress, on room air, likely asthma exacerbation. Lungs clear currently. No wheezing. Complete prednisone taper on discharge  Mildly elevated cr at 1.14, suspected baseline ckd II  Obesity: life style modification  Breast ca, s/p lumpectomy, currently under XRT, defer to outpatient oncology and xrt.   Constipation: Xray abd demonstrated stool throughout colon with no signs of obstruction. Pt does have bowel sounds and is passing flatus. Ultimately improved with multiple cathartics   Discharge Exam: Filed Vitals:   02/08/16 2322 02/09/16 0523 02/09/16 0629 02/09/16 0824  BP: 115/72  96/64   Pulse: 74  70   Temp: 98.2 F (36.8 C)  97.6 F (36.4 C)   TempSrc: Oral  Oral   Resp: 20  18   Height:       Weight:      SpO2: 96% 95% 99% 97%    General: Awake, in nad Cardiovascular: regular, s1, s2 Respiratory: normal resp effort, no wheezing  Discharge Instructions     Medication List    TAKE these medications        docusate sodium 100 MG capsule  Commonly known as:  COLACE  Take 1 capsule (100 mg total) by mouth 2 (two) times daily.     ketorolac 10 MG tablet  Commonly known as:  TORADOL  Take 1 tablet (10 mg total) by mouth every 6 (six) hours as needed.     oxyCODONE-acetaminophen 5-325 MG tablet  Commonly known as:  PERCOCET/ROXICET  Take 1 tablet by mouth every 4 (four) hours as needed.     polyethylene glycol packet  Commonly known as:  MIRALAX / GLYCOLAX  Take 17 g by mouth daily.     predniSONE 5 MG tablet  Commonly known as:  DELTASONE  Take 1 tablet (5 mg total) by mouth daily with breakfast.     VENTOLIN HFA 108 (90 Base) MCG/ACT inhaler  Generic drug:  albuterol  Inhale 2 puffs into the lungs every 6 (six) hours as needed for wheezing or shortness of breath. Reported on 11/16/2015       No Known Allergies Follow-up Information    Schedule an appointment as soon as possible for a visit with Follow up with your PCP in 2-3 weeks.   Why:  Hospital follow up       The results of significant diagnostics from this hospitalization (including imaging, microbiology,  ancillary and laboratory) are listed below for reference.    Significant Diagnostic Studies: Dg Chest 2 View  02/06/2016  CLINICAL DATA:  Dyspnea and myalgias for 1 day. EXAM: CHEST  2 VIEW COMPARISON:  11/01/2015 FINDINGS: The heart size and mediastinal contours are within normal limits. Both lungs are clear. The visualized skeletal structures are unremarkable. IMPRESSION: No active cardiopulmonary disease. Electronically Signed   By: Andreas Newport M.D.   On: 02/06/2016 03:26   Dg Abd 1 View  02/08/2016  CLINICAL DATA:  Constipation.  Colon obstruction. EXAM: ABDOMEN - 1 VIEW COMPARISON:   02/06/2016 abdominal radiograph. FINDINGS: No dilated small bowel loops. Moderate stool volume throughout the colon, increased. No evidence of pneumatosis or pneumoperitoneum. No pathologic soft tissue calcifications. IMPRESSION: Nonobstructive bowel gas pattern. Moderate diffuse colonic stool volume, increased, suggesting constipation. Electronically Signed   By: Ilona Sorrel M.D.   On: 02/08/2016 11:24   Dg Abd Portable 1v  02/06/2016  CLINICAL DATA:  Nausea vomiting and right lower abdominal pain, onset yesterday EXAM: PORTABLE ABDOMEN - 1 VIEW COMPARISON:  None. FINDINGS: Three portable views of the abdomen demonstrate an unremarkable gas pattern. No evidence of obstruction or perforation. IMPRESSION: Negative for obstruction or perforation. Electronically Signed   By: Andreas Newport M.D.   On: 02/06/2016 22:11   Ct Renal Stone Study  02/06/2016  CLINICAL DATA:  Nausea and vomiting since 1400 hours. Received radiation treatment for breast cancer today. Dysuria and back pain. EXAM: CT ABDOMEN AND PELVIS WITHOUT CONTRAST TECHNIQUE: Multidetector CT imaging of the abdomen and pelvis was performed following the standard protocol without IV contrast. COMPARISON:  Abdominal radiographs October 06, 2014 and CT abdomen and pelvis December 24, 2004 FINDINGS: LUNG BASES: Subcentimeter pleural-based 4 mm nodule is likely benign, axial 9/31. The visualized heart and pericardium are unremarkable. KIDNEYS/BLADDER: Kidneys are orthotopic, demonstrating normal size and morphology. No nephrolithiasis, hydronephrosis; limited assessment for renal masses on this nonenhanced examination. The unopacified ureters are normal in course and caliber. Urinary bladder is partially distended and unremarkable. SOLID ORGANS: The liver, spleen, gallbladder, pancreas and adrenal glands are unremarkable for this non-contrast examination. GASTROINTESTINAL TRACT: The stomach, small and large bowel are normal in course and caliber without  inflammatory changes, the sensitivity may be decreased by lack of enteric contrast. Normal appendix. PERITONEUM/RETROPERITONEUM: Aortoiliac vessels are normal in course and caliber. No lymphadenopathy by CT size criteria. Status post hysterectomy. No intraperitoneal free fluid nor free air. SOFT TISSUES/ OSSEOUS STRUCTURES: Nonsuspicious. Focal sclerosis at LEFT sacroiliac joint compatible with osteoarthrosis, progressed from prior CT. IMPRESSION: No urolithiasis or obstructive uropathy. No acute intra-abdominal/ pelvic process. Electronically Signed   By: Elon Alas M.D.   On: 02/06/2016 02:27    Microbiology: Recent Results (from the past 240 hour(s))  Respiratory virus panel     Status: None   Collection Time: 02/06/16  5:33 PM  Result Value Ref Range Status   Respiratory Syncytial Virus A Negative Negative Final   Respiratory Syncytial Virus B Negative Negative Final   Influenza A Negative Negative Final   Influenza B Negative Negative Final   Parainfluenza 1 Negative Negative Final   Parainfluenza 2 Negative Negative Final   Parainfluenza 3 Negative Negative Final   Metapneumovirus Negative Negative Final   Rhinovirus Negative Negative Final   Adenovirus Negative Negative Final    Comment: (NOTE) Performed At: [facility] [address] [place], [place] [id number] Lindon Romp MD [id number]   MRSA PCR Screening  Status: None   Collection Time: 02/06/16  5:34 PM  Result Value Ref Range Status   MRSA by PCR NEGATIVE NEGATIVE Final    Comment:        The GeneXpert MRSA Assay (FDA approved for NASAL specimens only), is one component of a comprehensive MRSA colonization surveillance program. It is not intended to diagnose MRSA infection nor to guide or monitor treatment for MRSA infections.   Gastrointestinal Panel by PCR , Stool     Status: None   Collection Time: 02/08/16  2:24 PM  Result Value Ref Range Status   Campylobacter species NOT DETECTED  NOT DETECTED Final   Plesimonas shigelloides NOT DETECTED NOT DETECTED Final   Salmonella species NOT DETECTED NOT DETECTED Final   Yersinia enterocolitica NOT DETECTED NOT DETECTED Final   Vibrio species NOT DETECTED NOT DETECTED Final   Vibrio cholerae NOT DETECTED NOT DETECTED Final   Enteroaggregative E coli (EAEC) NOT DETECTED NOT DETECTED Final   Enteropathogenic E coli (EPEC) NOT DETECTED NOT DETECTED Final   Enterotoxigenic E coli (ETEC) NOT DETECTED NOT DETECTED Final   Shiga like toxin producing E coli (STEC) NOT DETECTED NOT DETECTED Final   E. coli O157 NOT DETECTED NOT DETECTED Final   Shigella/Enteroinvasive E coli (EIEC) NOT DETECTED NOT DETECTED Final   Cryptosporidium NOT DETECTED NOT DETECTED Final   Cyclospora cayetanensis NOT DETECTED NOT DETECTED Final   Entamoeba histolytica NOT DETECTED NOT DETECTED Final   Giardia lamblia NOT DETECTED NOT DETECTED Final   Adenovirus F40/41 NOT DETECTED NOT DETECTED Final   Astrovirus NOT DETECTED NOT DETECTED Final   Norovirus GI/GII NOT DETECTED NOT DETECTED Final   Rotavirus A NOT DETECTED NOT DETECTED Final   Sapovirus (I, II, IV, and V) NOT DETECTED NOT DETECTED Final  C difficile quick scan w PCR reflex     Status: None   Collection Time: 02/08/16  2:24 PM  Result Value Ref Range Status   C Diff antigen NEGATIVE NEGATIVE Final   C Diff toxin NEGATIVE NEGATIVE Final   C Diff interpretation Negative for toxigenic C. difficile  Final     Labs: Basic Metabolic Panel:  Recent Labs Lab 02/05/16 2310 02/06/16 1607 02/07/16 0838  NA 138  --  140  K 3.7  --  4.1  CL 104  --  110  CO2 22  --  22  GLUCOSE 121*  --  110*  BUN <5*  --  <5*  CREATININE 1.14* 1.17* 0.96  CALCIUM 9.3  --  8.5*  MG  --   --  2.1   Liver Function Tests:  Recent Labs Lab 02/05/16 2310  AST 24  ALT 23  ALKPHOS 105  BILITOT 1.2  PROT 7.8  ALBUMIN 3.8    Recent Labs Lab 02/05/16 2310  LIPASE 25   No results for input(s): AMMONIA  in the last 168 hours. CBC:  Recent Labs Lab 02/05/16 2310 02/06/16 1607 02/07/16 0838  WBC 10.9* 7.1 9.9  HGB 12.7 12.5 11.8*  HCT 37.3 35.9* 34.2*  MCV 82.0 82.5 83.2  PLT 263 262 219   Cardiac Enzymes: No results for input(s): CKTOTAL, CKMB, CKMBINDEX, TROPONINI in the last 168 hours. BNP: BNP (last 3 results) No results for input(s): BNP in the last 8760 hours.  ProBNP (last 3 results) No results for input(s): PROBNP in the last 8760 hours.  CBG: No results for input(s): GLUCAP in the last 168 hours.   Signed:  CHIU, STEPHEN K  [facility] 02/09/2016, 6:23 PM

## 2016-02-09 NOTE — Telephone Encounter (Signed)
Spoke with Ms. Lewi to get an update she will be discharged around 3 pm today and needs to find a way home.  Transportation will be an issue today coming for radiation she has to arrange in advance for transportation.  Will return on Monday for her radiation treatments and she needs to keep her same appointment time because of her transportation arrangement. L2 treatment team made aware of the above discussion.

## 2016-02-09 NOTE — Progress Notes (Signed)
Yolanda White to be D/C'd Home per MD order.  Discussed with the patient and all questions fully answered.  VSS, Skin clean, dry and intact without evidence of skin break down, no evidence of skin tears noted. IV catheter discontinued intact. Site without signs and symptoms of complications. Dressing and pressure applied.  An After Visit Summary was printed and given to the patient. Patient received prescription.  D/c education completed with patient/family including follow up instructions, medication list, d/c activities limitations if indicated, with other d/c instructions as indicated by MD - patient able to verbalize understanding, all questions fully answered.   Patient instructed to return to ED, call 911, or call MD for any changes in condition.   Patient escorted via [place], and D/C home via private auto.  Malcolm Metro 02/09/2016 11:35 AM

## 2016-02-09 NOTE — Progress Notes (Signed)
Counseling intern called pt due to pt no-show of second appt; pt reports being hospitalized for the last week due to an asthma attack. Counseling intern will follow up with pt later on this afternoon to reschedule counseling appt.  Duffy Rhody Counseling Intern

## 2016-02-11 ENCOUNTER — Ambulatory Visit: Payer: Medicaid Other

## 2016-02-12 ENCOUNTER — Ambulatory Visit: Payer: Medicaid Other

## 2016-02-12 ENCOUNTER — Encounter: Payer: Self-pay | Admitting: Radiation Oncology

## 2016-02-12 ENCOUNTER — Ambulatory Visit
Admission: RE | Admit: 2016-02-12 | Discharge: 2016-02-12 | Disposition: A | Discharge status: Home / Self Care | Payer: Medicaid Other | Source: Ambulatory Visit | Attending: Radiation Oncology | Admitting: Radiation Oncology

## 2016-02-12 VITALS — BP 132/97 | HR 89 | Temp 98.0°F | Resp 18 | Ht 62.0 in | Wt 247.9 lb

## 2016-02-12 DIAGNOSIS — Z170 Estrogen receptor positive status [ER+]: Secondary | ICD-10-CM | POA: Diagnosis not present

## 2016-02-12 DIAGNOSIS — C50212 Malignant neoplasm of upper-inner quadrant of left female breast: Secondary | ICD-10-CM

## 2016-02-12 DIAGNOSIS — Z510 Encounter for antineoplastic radiation therapy: Secondary | ICD-10-CM | POA: Diagnosis present

## 2016-02-12 NOTE — Progress Notes (Signed)
Ms. Faughnan has received 25 fractions to her left breast.  Skin to left breast dry desquamation under the left breast, using neosporin to her breast and applying telfa.  Has some pain above the left nipple and above the breast.  Using Sonafine to her breast.  Appetite poor not eating very much most days drinking a lot of fluids.  Having fatigue most of the day wants to just sleep.  Pain 7/10 to low back taking Ibuprofen out of he Oxycodone.  Admitted to [facility] on last Monday,02-05-16 after her radiation treatment with an asthma attack.  Her asthma is under control. It was discovered that she also has a bowel obstruction and may need surgery for a colostomy in the future.  Having nausea and vomiting no vomiting since last Monday.  Still having nausea.  Will see her PCP on tomorrow. BP 132/97 mmHg  Pulse 89  Temp(Src) 98 F (36.7 C) (Oral)  Resp 18  Ht 5\' 2"  (1.575 m)  Wt 247 lb 14.4 oz (112.447 kg)  BMI 45.33 kg/m2  SpO2 99%

## 2016-02-12 NOTE — Progress Notes (Signed)
Weekly Management Note Outpatient   ICD-9-CM ICD-10-CM   1. Breast cancer of upper-inner quadrant of left female breast (HCC) 174.2 C50.212     Current Dose: 55 Gy  Projected Dose: 61Gy   Narrative:  The patient presents for routine under treatment assessment after her 25th fraction.  CBCT/MVCT images/Port film x-rays were reviewed.  The chart was checked. Skin breakdown in inframammary fold is dry. Using neosporin and Sonafine. Discharged from hospital with bowel obstruction and asthma exacerbation. May need a colostomy in the future. Asthma is better. Vomiting has stopped since hospital stay.  Physical Findings: Weight: 247 lb 14.4 oz (112.447 kg).dry desquamation in the inframammary fold and axilla. Diffuse hyperpigmentation over left breast.   Impression:  The patient is tolerating radiation.  Plan:  Continue treatment as planned.  -----------------------------------  Eppie Gibson, MD

## 2016-02-13 ENCOUNTER — Encounter: Payer: Self-pay | Admitting: Radiation Oncology

## 2016-02-13 ENCOUNTER — Ambulatory Visit: Payer: Medicaid Other

## 2016-02-13 ENCOUNTER — Ambulatory Visit
Admit: 2016-02-13 | Discharge: 2016-02-13 | Disposition: A | Discharge status: Home / Self Care | Payer: Medicaid Other | Attending: Radiation Oncology | Admitting: Radiation Oncology

## 2016-02-13 ENCOUNTER — Ambulatory Visit
Admission: RE | Admit: 2016-02-13 | Discharge: 2016-02-13 | Disposition: A | Discharge status: Home / Self Care | Payer: Medicaid Other | Source: Ambulatory Visit | Attending: Radiation Oncology | Admitting: Radiation Oncology

## 2016-02-13 VITALS — BP 126/82 | HR 64 | Temp 98.2°F | Ht 62.0 in | Wt 252.4 lb

## 2016-02-13 DIAGNOSIS — C50212 Malignant neoplasm of upper-inner quadrant of left female breast: Secondary | ICD-10-CM

## 2016-02-13 DIAGNOSIS — Z510 Encounter for antineoplastic radiation therapy: Secondary | ICD-10-CM | POA: Diagnosis not present

## 2016-02-13 NOTE — Progress Notes (Signed)
Weekly Management Note Current Dose:  57 Gy  Projected Dose: 61 Gy   Narrative:  The patient presents for routine under treatment assessment.  CBCT/MVCT images/Port film x-rays were reviewed.  The chart was checked. Discharged after asthma attack. Struggling with constipation. Not taking oxycodone. Has appt with PCP today.   Physical Findings: Weight: 252 lb 6.4 oz (114.488 kg). Skin has healed in inframammary incision. Rest of skin is dark with some pink skin in boost field.   Impression:  The patient is tolerating radiation.  Plan:  Continue treatment as planned. Discussed survivorship and post RT skin care.

## 2016-02-13 NOTE — Progress Notes (Signed)
Yolanda White has received 31 fractions to her left breast.  Note desquamation in the inframmary fold and in the lower axillary region with hyperpigmentation. She reports "shooting pains to left breast. Has little appetite, trying to make herself eat.  Still drinking a lot of liquids.  Having fatigue most of the day.  Going to see her PCP today for bowel issues.  Still no bowel movement since Friday.  Pain 8/10 to low back taking Toradol.

## 2016-02-14 ENCOUNTER — Ambulatory Visit
Admission: RE | Admit: 2016-02-14 | Discharge: 2016-02-14 | Disposition: A | Discharge status: Home / Self Care | Payer: Medicaid Other | Source: Ambulatory Visit | Attending: Radiation Oncology | Admitting: Radiation Oncology

## 2016-02-14 ENCOUNTER — Telehealth: Payer: Self-pay | Admitting: Hematology

## 2016-02-14 ENCOUNTER — Other Ambulatory Visit (HOSPITAL_BASED_OUTPATIENT_CLINIC_OR_DEPARTMENT_OTHER): Payer: Medicaid Other

## 2016-02-14 ENCOUNTER — Ambulatory Visit: Payer: Medicaid Other

## 2016-02-14 ENCOUNTER — Ambulatory Visit (HOSPITAL_BASED_OUTPATIENT_CLINIC_OR_DEPARTMENT_OTHER): Payer: Medicaid Other | Admitting: Hematology

## 2016-02-14 ENCOUNTER — Encounter: Payer: Self-pay | Admitting: Hematology

## 2016-02-14 VITALS — BP 117/66 | HR 80 | Temp 98.5°F | Resp 18 | Ht 62.0 in | Wt 252.1 lb

## 2016-02-14 DIAGNOSIS — C50212 Malignant neoplasm of upper-inner quadrant of left female breast: Secondary | ICD-10-CM

## 2016-02-14 DIAGNOSIS — Z510 Encounter for antineoplastic radiation therapy: Secondary | ICD-10-CM | POA: Diagnosis not present

## 2016-02-14 DIAGNOSIS — F419 Anxiety disorder, unspecified: Secondary | ICD-10-CM | POA: Diagnosis not present

## 2016-02-14 DIAGNOSIS — M545 Low back pain: Secondary | ICD-10-CM | POA: Diagnosis not present

## 2016-02-14 DIAGNOSIS — Z720 Tobacco use: Secondary | ICD-10-CM

## 2016-02-14 LAB — CBC WITH DIFFERENTIAL/PLATELET
BASO%: 0.6 % (ref 0.0–2.0)
BASOS ABS: 0.1 10*3/uL (ref 0.0–0.1)
EOS%: 8.7 % — AB (ref 0.0–7.0)
Eosinophils Absolute: 0.7 10*3/uL — ABNORMAL HIGH (ref 0.0–0.5)
HCT: 37.2 % (ref 34.8–46.6)
HEMOGLOBIN: 12.6 g/dL (ref 11.6–15.9)
LYMPH%: 20 % (ref 14.0–49.7)
MCH: 28.2 pg (ref 25.1–34.0)
MCHC: 33.9 g/dL (ref 31.5–36.0)
MCV: 83.2 fL (ref 79.5–101.0)
MONO#: 0.6 10*3/uL (ref 0.1–0.9)
MONO%: 7.3 % (ref 0.0–14.0)
NEUT#: 5.3 10*3/uL (ref 1.5–6.5)
NEUT%: 63.4 % (ref 38.4–76.8)
Platelets: 216 10*3/uL (ref 145–400)
RBC: 4.47 10*6/uL (ref 3.70–5.45)
RDW: 13.2 % (ref 11.2–14.5)
WBC: 8.4 10*3/uL (ref 3.9–10.3)
lymph#: 1.7 10*3/uL (ref 0.9–3.3)

## 2016-02-14 LAB — COMPREHENSIVE METABOLIC PANEL
ALBUMIN: 3.4 g/dL — AB (ref 3.5–5.0)
ALT: 18 U/L (ref 0–55)
AST: 11 U/L (ref 5–34)
Alkaline Phosphatase: 84 U/L (ref 40–150)
Anion Gap: 8 mEq/L (ref 3–11)
BUN: 15.2 mg/dL (ref 7.0–26.0)
CHLORIDE: 104 meq/L (ref 98–109)
CO2: 24 meq/L (ref 22–29)
Calcium: 9 mg/dL (ref 8.4–10.4)
Creatinine: 1.2 mg/dL — ABNORMAL HIGH (ref 0.6–1.1)
EGFR: 56 mL/min/{1.73_m2} — ABNORMAL LOW (ref >90)
GLUCOSE: 110 mg/dL (ref 70–140)
POTASSIUM: 4.6 meq/L (ref 3.5–5.1)
SODIUM: 136 meq/L (ref 136–145)
Total Bilirubin: 0.48 mg/dL (ref 0.20–1.20)
Total Protein: 7 g/dL (ref 6.4–8.3)

## 2016-02-14 MED ORDER — TAMOXIFEN CITRATE 20 MG PO TABS: 20.0000 mg | ORAL_TABLET | Freq: Every day | ORAL | Status: DC

## 2016-02-14 MED ORDER — LACTULOSE 20 G PO PACK: 20.0000 g | PACK | Freq: Three times a day (TID) | ORAL | Status: DC

## 2016-02-14 NOTE — Progress Notes (Signed)
Yolanda White  [phone]) [phone] [phone]  Clinic follow up Note   Patient Care Team: Nolene Ebbs, MD as PCP - General (Internal Medicine) Excell Seltzer, MD as Consulting Physician (General Surgery) Truitt Merle, MD as Consulting Physician (Hematology) Thea Silversmith, MD as Consulting Physician (Radiation Oncology) 02/14/2016  CHIEF COMPLAINTS:  Follow up left breast cancer.  Oncology History   Breast cancer of upper-inner quadrant of left female breast [facility])   Staging form: Breast, AJCC 7th Edition     Clinical: Stage IA (T1c, N0, M0) - Signed by Truitt Merle, MD on 09/13/2015     Pathologic: No stage assigned - Unsigned       Breast cancer of upper-inner quadrant of left female breast ([place])   08/29/2015 Mammogram diagnostic mammo and US showed a 1.2X0.9X0.6cm mass in left breast 11:00 position    09/01/2015 Receptors her2 ER 90%+, PR 90%+, HER2-, Ki67 25%   09/01/2015 Initial Biopsy left breast biopsy showed invasive ductal carcinoma, G1   09/01/2015 Initial Diagnosis Breast cancer of upper-inner quadrant of left female breast ([place])   10/11/2015 Surgery left breast lumpectomy and SLN biopsy   10/11/2015 Pathology Results invasive ductal carcinoma, 3cm, LVI(-), margins negative, grade 1, 3 nodes (-)   10/11/2015 Oncotype testing RS 17 (low risk), which predicts 10 year risk of distant recurrence with tamoxifen alone 11%   12/18/2015 -  Radiation Therapy adjuvant breast radiation    HISTORY OF PRESENTING ILLNESS:  Yolanda White 45 y.o. female is here because of her newly diagnosed left breast cancer. She is accompanied by her 2 daughters and one sister to our multidisciplinary breast clinic today.  She has not seen a primary care physician for many years. She presented with intermittent left breast pain for one month, and she was seen in the emergency room on 08/08/2015. She was referred to breast imaging center and had a mammogram done on 08/29/2015.  Which showed a 1.2 cm mass in the left breast 11:00 position. She underwent core needle biopsy on 09/01/2015, which showed invasive ductal carcinoma, ER/PR stripe positive, HER-2 negative.  She has had lot of social and financial stress in the past year. She lives in a hotel now, her daughters live with her sister. She reports right-sided low back pain for the past few months, positional, she denies injury prior to that. She is also quite depressed and very anxious since the cancer diagnosis, she denies any significant dyspnea, GI symptoms or other new symptoms.  CURRENT THERAPY: pending adjuvant Tamoxifen   INTERIM HISTORY: Yolanda White returns for follow-up. She has been tolerating adjuvant breast radiation well overall,  Moderate  radiation dermatitis,  No other significant side effects. Her main complaint is her back pain, which has been persistent and severe, she rates at 8-9 out of 10, this has been going on for a few months.  She will admitted to hospital for asthma exacerbation and aspiration a few weeks ago,  And was released after 4 days of hospital stay. She still has mild dry cough, multiple  Her family members got cold also recently.  She is also recently constipated, she was giving enema and MiraLAX in the hospital, is still quite constipated, with  Abdominal discomfort sometime. She has been drinking a lot of water and prone juice.   MEDICAL HISTORY:  Past Medical History  Diagnosis Date  . Asthma   . Seasonal allergies   . Breast cancer of upper-inner quadrant of left female breast ([place]) 09/08/2015  .  Breast cancer ([place])   . Anxiety   . Depression     SURGICAL HISTORY: Past Surgical History  Procedure Laterality Date  . Abdominal hysterectomy    . Tubal ligation    . Radioactive seed guided mastectomy with axillary sentinel lymph node biopsy Left 10/11/2015    Procedure: RADIOACTIVE SEED LOCALIZATION LEFT BREAST LUMPECTOMY WITH LEFT  AXILLARY SENTINEL LYMPH NODE BIOPSY;   Surgeon: Excell Seltzer, MD;  Location: [place];  Service: General;  Laterality: Left;   GYN HISTORY  Menarchal: 12 LMP: Hysterectomy in 2010  Contraceptive: she was on for one year , stopped 23 years ago  HRT: n/a G4  P3: she was 33 with her first child, (+) breast feeding    SOCIAL HISTORY: Social History   Social History  . Marital Status: Single    Spouse Name: N/A  . Number of Children: 3 daughters age 82-25  . Years of Education: N/A   Occupational History  . Not on file.   Social History Main Topics  . Smoking status: Current Every Day Smoker -- 0.30 packs/day    Types: Cigarettes  . Smokeless tobacco: Not on file  . Alcohol Use: No  . Drug Use: No  . Sexual Activity: Yes    Birth Control/ Protection: Surgical   Other Topics Concern  . Not on file   Social History Narrative    FAMILY HISTORY: Family History  Problem Relation Age of Onset  . Diabetes Mother   . Diabetes Father   . Lung cancer Father 51    smoker  . Cancer Paternal Aunt     cervical  . Diabetes Brother   . Heart attack Paternal Grandmother     in her 73s  . Diabetes Brother     maternal half brother  . Asthma Brother   . Asthma Maternal Aunt     ALLERGIES:  has No Known Allergies.  MEDICATIONS:  Current Outpatient Prescriptions  Medication Sig Dispense Refill  . albuterol (VENTOLIN HFA) 108 (90 BASE) MCG/ACT inhaler Inhale 2 puffs into the lungs every 6 (six) hours as needed for wheezing or shortness of breath. Reported on 11/16/2015    . docusate sodium (COLACE) 100 MG capsule Take 1 capsule (100 mg total) by mouth 2 (two) times daily. 10 capsule 0  . ketorolac (TORADOL) 10 MG tablet Take 1 tablet (10 mg total) by mouth every 6 (six) hours as needed. 20 tablet 0  . polyethylene glycol (MIRALAX / GLYCOLAX) packet Take 17 g by mouth daily. 14 each 0  . predniSONE (DELTASONE) 5 MG tablet Take 1 tablet (5 mg total) by mouth daily with breakfast.    . lactulose  (CEPHULAC) 20 g packet Take 1 packet (20 g total) by mouth 3 (three) times daily. 30 each 0  . oxyCODONE-acetaminophen (PERCOCET/ROXICET) 5-325 MG tablet Take 1 tablet by mouth every 4 (four) hours as needed. (Patient not taking: Reported on 02/13/2016) 30 tablet 0  . tamoxifen (NOLVADEX) 20 MG tablet Take 1 tablet (20 mg total) by mouth daily. 30 tablet 1   No current facility-administered medications for this visit.    REVIEW OF SYSTEMS:   Constitutional: Denies fevers, chills or abnormal night sweats Eyes: Denies blurriness of vision, double vision or watery eyes Ears, nose, mouth, throat, and face: Denies mucositis or sore throat Respiratory: Denies cough, dyspnea or wheezes Cardiovascular: Denies palpitation, chest discomfort or lower extremity swelling Gastrointestinal:  Denies nausea, heartburn or change in bowel habits Skin: Denies  abnormal skin rashes Lymphatics: Denies new lymphadenopathy or easy bruising Neurological:Denies numbness, tingling or new weaknesses Behavioral/Psych: Mood is stable, no new changes  All other systems were reviewed with the patient and are negative.  PHYSICAL EXAMINATION: ECOG PERFORMANCE STATUS: 1 - Symptomatic but completely ambulatory  Filed Vitals:   02/14/16 1133  BP: 117/66  Pulse: 80  Temp: 98.5 F (36.9 C)  Resp: 18   Filed Weights   02/14/16 1133  Weight: 252 lb 1.6 oz (114.352 kg)    GENERAL:alert, no distress and comfortable SKIN: skin color, texture, turgor are normal, no rashes or significant lesions EYES: normal, conjunctiva are pink and non-injected, sclera clear OROPHARYNX:no exudate, no erythema and lips, buccal mucosa, and tongue normal  NECK: supple, thyroid normal size, non-tender, without nodularity LYMPH:  no palpable lymphadenopathy in the cervical, axillary or inguinal LUNGS: clear to auscultation and percussion with normal breathing effort HEART: regular rate & rhythm and no murmurs and no lower extremity  edema ABDOMEN:abdomen soft, non-tender and normal bowel sounds Musculoskeletal:no cyanosis of digits and no clubbing  PSYCH: alert & oriented x 3 with fluent speech NEURO: no focal motor/sensory deficits Breasts: Breast inspection showed them to be symmetrical with no nipple discharge. The surgical incision above the left breast nipple is well healed, (+)  Diffuse skin pigmentation in the left breast, mild skin peeling of on the inferior of left breast, no  Ulcers or blisters.  Exam of the right breast and bilateral axillars was negative for mass or adenopathy.  LABORATORY DATA:  I have reviewed the data as listed Lab Results  Component Value Date   WBC 8.4 02/14/2016   HGB 12.6 02/14/2016   HCT 37.2 02/14/2016   MCV 83.2 02/14/2016   PLT 216 02/14/2016    Recent Labs  09/13/15 0752 11/01/15 0033 02/05/16 2310 02/06/16 1607 02/07/16 0838 02/14/16 1118  NA 139 139 138  --  140 136  K 4.4 4.3 3.7  --  4.1 4.6  CL  --  107 104  --  110  --   CO2 24 21* 22  --  22 24  GLUCOSE 85 108* 121*  --  110* 110  BUN 16.8 14 <5*  --  <5* 15.2  CREATININE 1.3* 1.01* 1.14* 1.17* 0.96 1.2*  CALCIUM 9.5 9.1 9.3  --  8.5* 9.0  GFRNONAA  --  >60 58* 56* >60  --   GFRAA  --  >60 >60 >60 >60  --   PROT 7.3  --  7.8  --   --  7.0  ALBUMIN 3.7  --  3.8  --   --  3.4*  AST 10  --  24  --   --  11  ALT 10  --  23  --   --  18  ALKPHOS 101  --  105  --   --  84  BILITOT 0.64  --  1.2  --   --  0.48   PATHOLOGY REPORT: Diagnosis 10/11/2015  1. Breast, lumpectomy, Left - INVASIVE DUCTAL CARCINOMA, SEE COMMENT. - INVASIVE TUMOR IS 2 MM FROM THE NEAREST MARGIN (SUPERIOR). - PREVIOUS BIOPSY SITE - SEE TUMOR SYNOPTIC TEMPLATE BELOW 2. Lymph node, sentinel, biopsy, Left axillary - ONE LYMPH NODE, NEGATIVE FOR TUMOR (0/1). - PLEASE SEE COMMENT. 3. Lymph node, sentinel, biopsy, Left axillary #2 - ONE LYMPH NODE, NEGATIVE FOR TUMOR (0/1). - PLEASE SEE COMMENT. 4. Lymph node, sentinel, biopsy, Left  axillary #3 - ONE LYMPH NODE,  NEGATIVE FOR TUMOR (0/1) - PLEASE SEE COMMENT. Microscopic Comment 1. BREAST, INVASIVE TUMOR, WITH LYMPH NODES PRESENT Specimen, including laterality and lymph node sampling (sentinel, non-sentinel): Left breast with sentinel lymph node sampling. Procedure: Lumpectomy. Histologic type: Ductal Grade: 1 of 3 Tubule formation: 2 Nuclear pleomorphism: 2 Mitotic:1 Tumor size (gross measurement): 3.0 cm Margins: Invasive, distance to closest margin: 2 mm (superior) In-situ, distance to closest margin: N/A If margin positive, focally or broadly: N/A Lymphovascular invasion: Absent. Ductal carcinoma in situ: Absent. Grade: N/A Extensive intraductal component: N/A Lobular neoplasia: Absent. Tumor focality: Unifocal, see comment. Treatment effect: None If present, treatment effect in breast tissue, lymph nodes or both: N/A Extent of tumor: Skin: N/A Nipple: N/A Skeletal muscle: N/A Lymph nodes: Examined: 3 Sentinel 0 Non-sentinel 3 Total Lymph nodes with metastasis: 0 Isolated tumor cells (< 0.2 mm): N/A Micrometastasis: (> 0.2 mm and < 2.0 mm): N/A Macrometastasis: (> 2.0 mm): N/A Extracapsular extension: N/A Breast prognostic profile: Estrogen receptor: Not repeated, previous study demonstrated 90% positivity ([id number]) Progesterone receptor: Not repeated, previous study demonstrated 90% positivity ([id number]) Her 2 neu: Repeated previous study demonstrated no amplification (1.27) ([id number]) Ki-67: Not repeated previous study demonstrated 25% proliferation rate ([id number]) Non-neoplastic breast: Previous biopsy site tissue changes. TNM: pT2, pN0, pMX Comments: Representative sections from the 3.0 cm stellate lesion with associated ribbon shaped clip demonstrate diagnostic features of invasive ductal carcinoma. Within a representative section from firm tissue away from the grossly identified primary mass, there is invasive ductal  caricnoma with extensive tissue fibrosis that is 2 mm fromtransfusion the nearest superior margin (slide 1H). Given that invasive tumor is infiltrating within grossly inapparent fibrous tissue, the invasive tumor is slide 1H is consider to represent grossly inapparent expansion of the primary 3.0cm mass and not multifocal disease. As such, the invasive tumor is considered to be at least 3.0cm in size. (CRR:gt, 10/13/15) 2. 3, and 4. There are no intranodal malignant metastatic tumor deposits identified on routine histology or with cytokeratin AE1/3 immunostains.   Results: HER2 - NEGATIVE RATIO OF HER2/CEP17 SIGNALS 1.18 AVERAGE HER2 COPY NUMBER PER CELL 2.30  ONCOTYPE DX RS 17 (low risk), which predicts 10 year risk of distant recurrence with tamoxifen alone 11%.  RADIOGRAPHIC STUDIES: I have personally reviewed the radiological images as listed and agreed with the findings in the report.  Mm Diag Breast Tomo Bilateral and [place]   08/29/2015  ADDENDUM REPORT: 08/29/2015 08:40 ADDENDUM: The left axilla was not evaluated during this exam. Left axillary ultrasound may be performed at the time of biopsy. Electronically Signed   By: Franki Cabot M.D.   On: 08/29/2015 08:40  08/29/2015  CLINICAL DATA:  Left breast lump EXAM: DIGITAL DIAGNOSTIC BILATERAL MAMMOGRAM WITH 3D TOMOSYNTHESIS WITH CAD ULTRASOUND LEFT BREAST COMPARISON:  Previous exam(s). ACR Breast Density Category b: There are scattered areas of fibroglandular density. FINDINGS: Bilateral CC and MLO views were obtained with 3D tomosynthesis. Additional spot compression view, with tomosynthesis, performed at the site of the palpable abnormality in the upper left breast which is demarcated with an overlying skin marker. There is architectural distortion with upper inner quadrant of the left breast, 11 o'clock axis region, corresponding to the site of palpable abnormality. There are no dominant masses, suspicious calcifications or secondary  signs of malignancy within the right breast. Mammographic images were processed with CAD. On physical exam, skin retraction noted within the upper left breast. Targeted ultrasound is performed, showing an irregular shadowing mass within the left breast at the  11 o'clock axis, 7 cm from the nipple, measuring 1.2 x 0.9 x 0.6 cm, corresponding to the site of palpable abnormality and mammographic finding. IMPRESSION: 1. Irregular shadowing mass within the left breast at the 11 o'clock axis, 7 cm from the nipple, measuring 1.2 x 0.9 x 0.6 cm, corresponding to the site of palpable abnormality and skin retraction, also corresponding to the area of architectural distortion on mammogram. This is a highly suspicious finding for which ultrasound-guided biopsy is recommended. 2.  No evidence of malignancy within the right breast. RECOMMENDATION: Ultrasound-guided biopsy for the irregular shadowing mass in the left breast at the 11 o'clock axis, 7 cm from the nipple, measuring 1.2 x 0.9 x 0.6 cm. Postprocedure mammogram to ensure mammographic and sonographic correlation. Ultrasound-guided biopsy is scheduled for November 4th at 8 a.m. I have discussed the findings and recommendations with the patient. Results were also provided in writing at the conclusion of the visit. If applicable, a reminder letter will be sent to the patient regarding the next appointment. BI-RADS CATEGORY  5: Highly suggestive of malignancy. Electronically Signed: By: Franki Cabot M.D. On: 08/25/2015 13:59     ASSESSMENT & PLAN: 45 year old female with past medical history of asthma, obesity, anxiety and depression, presented with left breast pain.  She had hysterectomy.   1. 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 -I discussed her Oncotype DX genomic test  result. She has low risk recurrence score 17, which predicts of 11% 10 year risk of distant recurrence with tamoxifen alone. She will not benefit from adjuvant chemotherapy. -Giving the strong ER and PR expression in her tumor and premenopausal status, I recommend adjuvant endocrine therapy with tamoxifen for a total of 10 years to reduce the risk of cancer recurrence.  She is finishing radiation therapy tomorrow, I plan to start her  In 2 weeks. -The potential side effects, which includes but not limited to, hot flash, skin and vaginal dryness, slightly increased risk of cardiovascular disease and cataract, small risk of thrombosis and endometrial cancer, were discussed with her in great details. Preventive strategies for thrombosis, such as being physically active, using compression stocks, avoid cigarette smoking, etc., were reviewed with her.  She has had hysterectomy, no risk for endometrial cancer. She voiced good understanding, and agrees to proceed.  -Alternative adjuvant endocrine therapy, such as aromatase inhibitor and overexpression were discussed with her also, given her young age. Due to her very early stage breast cancer,  I think  The additional benefit is limited. Due to her severe back pain, I do not think she will tolerate aromatase inhibitor well.  -We also reviewed the breast cancer surveillance, including annual mammogram, and physical exam.   2.  Severe back pain - likely related to her morbid obesity and degenerative changes - I recommend her to follow-up with her primary care physician, and see if she needs to see a orthopedic surgeon - she  Will continue Tylenol and ibuprofen as needed, she does not want to have narcotics due to her constipation  3. Genetics -She is a very young, we recommended genetic testing for inheritable breast/ovarian cancer syndrome. She was tested for [facility] gene panel which was negative.  4. Asthma, anxiety and obesity  - she'll follow-up with her  primary care physician  5.  Severe back pain - possibly related  to her morbid obesity and degenerative changes - she will call her primary care physician to get appointment as soon as possible   Plan - she will start tamoxifen in 2-3 weeks. I called into her pharmacy today - I'll see her back in 6 weeks. - We'll set up survivorship clinic visit on next visit  All questions were answered. The patient knows to call the clinic with any problems, questions or concerns. I spent 25 minutes counseling the patient face to face. The total time spent in the appointment was 30 minutes and more than 50% was on counseling.     Truitt Merle, MD 02/14/2016 2:11 PM

## 2016-02-14 NOTE — Telephone Encounter (Signed)
left msg for may appt °

## 2016-02-14 NOTE — Progress Notes (Signed)
Counseling intern saw pt in the lobby of the [facility] and discussed setting up another appointment. Counseling intern called pt at 4:30pm to schedule appt; second session is planned for 4/28 at 8:15am. Counseling intern will follow up with pt on Wednesday 4/26 to confirm appt.   Duffy Rhody Counseling Intern

## 2016-02-15 ENCOUNTER — Encounter: Payer: Self-pay | Admitting: Radiation Oncology

## 2016-02-15 ENCOUNTER — Ambulatory Visit: Payer: Medicaid Other

## 2016-02-15 ENCOUNTER — Encounter: Payer: Self-pay | Admitting: *Deleted

## 2016-02-15 ENCOUNTER — Ambulatory Visit
Admission: RE | Admit: 2016-02-15 | Discharge: 2016-02-15 | Disposition: A | Discharge status: Home / Self Care | Payer: Medicaid Other | Source: Ambulatory Visit | Attending: Radiation Oncology | Admitting: Radiation Oncology

## 2016-02-15 DIAGNOSIS — Z510 Encounter for antineoplastic radiation therapy: Secondary | ICD-10-CM | POA: Diagnosis not present

## 2016-02-15 NOTE — Progress Notes (Signed)
[place] Work  Clinical Social Work was contacted by pt  for assessment of psychosocial needs.  Pt left CSW message, but CSW was in a meeting. CSW returned pt's call. Pt shared she was going to the ring the bell today and wanted to share with CSW. Clinical Social Worker was at a meeting at [employer]. Pt in good spirits and doing pretty well. Pt has several more sessions planned with counseling interns and is aware this CSW available as well for support. CSW to check in with pt at her follow up appointments.   Clinical Social Work interventions: Supportive listening  Loren Racer, [place] Worker [place]  [place] Phone: [phone] Fax: [phone]

## 2016-02-16 ENCOUNTER — Ambulatory Visit: Payer: Medicaid Other

## 2016-02-16 ENCOUNTER — Telehealth: Payer: Self-pay | Admitting: *Deleted

## 2016-02-16 NOTE — Telephone Encounter (Signed)
Called pt to congratulate on completion of xrt. Relate doing well and without complaints. Discussed survivorship program and referral. Encourage pt to call with needs. Received verbal understanding.

## 2016-02-19 ENCOUNTER — Ambulatory Visit: Payer: Medicaid Other

## 2016-02-23 ENCOUNTER — Telehealth: Payer: Self-pay | Admitting: Hematology

## 2016-02-23 ENCOUNTER — Other Ambulatory Visit: Payer: Self-pay | Admitting: Adult Health

## 2016-02-23 DIAGNOSIS — C50212 Malignant neoplasm of upper-inner quadrant of left female breast: Secondary | ICD-10-CM

## 2016-02-23 NOTE — Telephone Encounter (Signed)
LEFT MSG FOR SCP VISIT IN LATE JUNE

## 2016-02-27 NOTE — Progress Notes (Signed)
Name: [name]   MRN: [id number]  Date:  01/23/16   DOB: [birth year]  Status:outpatient    DIAGNOSIS: Breast cancer of upper-inner quadrant of left female breast [facility])   Staging form: Breast, AJCC 7th Edition     Clinical: Stage IA (T1c, N0, M0) - Signed by Truitt Merle, MD on 09/13/2015     Pathologic stage from 10/13/2015: Stage IIA (T2, N0, cM0) - Unsigned       Staging comments: Staged on surgical specimen by Dr. Donato Heinz    CONSENT VERIFIED: yes   SET UP: Patient is setup supine   IMMOBILIZATION:  The following immobilization was used:Custom Moldable Pillow, breast board.   NARRATIVE: Yolanda White underwent complex simulation and treatment planning for her boost treatment today.  Her tumor volume was outlined on the planning CT scan. The depth of her cavity was felt to be appropriate for treatment with electrons    15  MeV electrons will be prescribed to the 100%  isodose line.   I personally oversaw and approved the construction of a unique block which will be used for beam modification purposes.  An isodose plan is requested. Special port plan is requested.

## 2016-02-27 NOTE — Progress Notes (Signed)
  Radiation Oncology         [phone] ________________________________  Name: Yolanda White MRN: [id number]  Date: 02/15/2016  DOB: 1971-02-26  End of Treatment Note  Diagnosis:   Breast cancer of upper-inner quadrant of left female breast [facility])   Staging form: Breast, AJCC 7th Edition     Clinical: Stage IA (T1c, N0, M0) - Signed by Truitt Merle, MD on 09/13/2015     Pathologic stage from 10/13/2015: Stage IIA (T2, N0, cM0) - Unsigned       Staging comments: Staged on surgical specimen by Dr. Donato Heinz       Indication for treatment:  Curative      Radiation treatment dates:   12/18/2015-02/15/2016  Site/dose:   1. Left breast/ 45 Gy at 1.8 Gy per fraction x 25 fractions.  2. Left breast boost/ 16 Gy at 2 Gy per fraction x 8 fractions  Beams/energy:  1. 3D-Conformal / 10X, 6X 2. Electron Monte Carlo / 15 MeV  Narrative: The patient tolerated radiation treatment relatively well.   The patient struggled with constipation, decrease in appetite, and fatigue during treatment. The patient experienced skin darkening with some pink skin in the boost field. She struggled with depression and the anniversary of her father's death for which she missed a few treatments.   Plan: The patient has completed radiation treatment. The patient will return to radiation oncology clinic for routine followup in one month. I advised them to call or return sooner if they have any questions or concerns related to their recovery or treatment.  ------------------------------------------------  Thea Silversmith, MD  This document serves as a record of services personally performed by Thea Silversmith, MD. It was created on her behalf by Arlyce Harman, a trained medical scribe. The creation of this record is based on the scribe's personal observations and the provider's statements to them. This document has been checked and approved by the attending provider.

## 2016-03-13 ENCOUNTER — Encounter: Payer: Self-pay | Admitting: Radiation Oncology

## 2016-03-13 ENCOUNTER — Ambulatory Visit
Admission: RE | Admit: 2016-03-13 | Discharge: 2016-03-13 | Disposition: A | Discharge status: Home / Self Care | Payer: Medicaid Other | Source: Ambulatory Visit | Attending: Radiation Oncology | Admitting: Radiation Oncology

## 2016-03-13 ENCOUNTER — Telehealth: Payer: Self-pay | Admitting: *Deleted

## 2016-03-13 VITALS — BP 106/68 | HR 86 | Temp 98.4°F | Resp 18 | Ht 62.0 in | Wt 246.0 lb

## 2016-03-13 DIAGNOSIS — C50212 Malignant neoplasm of upper-inner quadrant of left female breast: Secondary | ICD-10-CM | POA: Insufficient documentation

## 2016-03-13 MED ORDER — OXYCODONE-ACETAMINOPHEN 5-325 MG PO TABS: 1.0000 | ORAL_TABLET | ORAL | Status: DC | PRN

## 2016-03-13 NOTE — Telephone Encounter (Signed)
Patient called c/o "sharp pain in left breast for two days.  It aches to touch it.  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  Finished RT 02-15-2016, returned to work 02-19-2016.  My fingers are swollen and I can't take this.  The left breast incision looks fine but my breast feels full like it could leak milk just like when I breast fed.  I do have carpel tunnel and have had fluid drained to help this in the past.  I have not started the Tamoxifen yet.  I cried after work last night from 12:30 am till 6:00 am.  I'm scared."  Advised of tomorrow's RT appointment and to begin Tamoxifen therapy.  Available to come in for [facility] before RT appointment if needed.  Return number [phone].

## 2016-03-13 NOTE — Progress Notes (Signed)
   Department of Radiation Oncology  Phone:  [phone] Fax:        [phone]   Name: Yolanda White MRN: [id number]  DOB: 13-Jan-1971  Date: 03/13/2016  Follow Up Visit Note  Diagnosis: Breast cancer of upper-inner quadrant of left female breast [facility])   Staging form: Breast, AJCC 7th Edition     Clinical: Stage IA (T1c, N0, M0) - Signed by Truitt Merle, MD on 09/13/2015     Pathologic stage from 10/13/2015: Stage IIA (T2, N0, cM0) - Unsigned       Staging comments: Staged on surgical specimen by Dr. Donato Heinz  Summary and Interval since last radiation: 1 month  12/18/2015-02/15/2016: 1. Left breast/ 45 Gy at 1.8 Gy per fraction x 25 fractions.  2. Left breast boost/ 16 Gy at 2 Gy per fraction x 8 fractions  Interval History: Yolanda White presents today for an unscheduled followup. The patient complains of sharp pain in the left breast for the past two days. It aches when touched.She is also complaining of left hand swelling and she feels that she cannot manage the pain anymore. She has been afebrile for the past two days. The patient went back to work the following Monday after the completion of radiation. 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 She has not started Tamoxifen.  Physical Exam:  Filed Vitals:   03/13/16 1609  BP: 106/68  Pulse: 86  Temp: 98.4 F (36.9 C)  TempSrc: Oral  Resp: 18  Height: 5\' 2"  (1.575 m)  Weight: 246 lb (111.585 kg)  SpO2: 99%  Significant for pain when raising her left arm and tenderness to palpation in her left axilla. I cannot appreciate any lymphedema. The skin of her left breast is healing well and she has some hyperpigmentation over her scar in the UOQ of the left breast.  IMPRESSION: Yolanda White is a 45 y.o. 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  PLAN: I refilled her Oxycodone and referred her to physical therapy, who I hope can see her either tomorrow or Friday. I will see her back in a month. She  really wants to return to work and I'm hopeful that we can help her do that although she understands that she may have to work at less demanding jobs on her arm. The patient is scheduled to see Dr. Burr Medico on 03/27/16 and Survivorship in late June.  Thea Silversmith, MD  This document serves as a record of services personally performed by Thea Silversmith, MD. It was created on her behalf by Darcus Austin, a trained medical scribe. The creation of this record is based on the scribe's personal observations and the provider's statements to them. This document has been checked and approved by the attending provider.

## 2016-03-13 NOTE — Progress Notes (Addendum)
Yolanda White is here today for follow up visit breast cancer of upper-inner quadrant of left breast.  Complaining of sharp pain in the left breast for the past two days the breast feel full like it could leak milk like when she breast fed.  It aches when touched.  Fingers are swollen and she can not take this pain.  Afebrile the past two days.  Skin status:Still has skin darkening to left breast Lotion being used: Coco butter to left breast Have you seen your medical oncologist? Date If not ,when is appointment Dr. [id number] ER+,have started AI or Tamoxifen? If not, why? Has not started Tamoxifen Discuss survivorship appointment:04-25-16   [name] Offer referral reading material for Survivorship, [facility] and [facility] given 02-05-16  Appetite:Eats when she is hungry Pain:Sharp pain left breast past two days 9/10 Tylenol Extra strength and Ibuprofen Arm mobility:Having a problem lifting left arm without severe pain Fatigue:Having fatigue all day. Having regular bowel movements, still taking Miralax daily, Lactulose daily and Colace bid. BP 106/68 mmHg  Pulse 86  Temp(Src) 98.4 F (36.9 C) (Oral)  Resp 18  Ht 5\' 2"  (1.575 m)  Wt 246 lb (111.585 kg)  BMI 44.98 kg/m2  SpO2 99%

## 2016-03-14 ENCOUNTER — Ambulatory Visit: Payer: Medicaid Other | Admitting: Radiation Oncology

## 2016-03-14 ENCOUNTER — Ambulatory Visit
Admission: RE | Admit: 2016-03-14 | Discharge: 2016-03-14 | Disposition: A | Discharge status: Home / Self Care | Payer: Medicaid Other | Source: Ambulatory Visit | Attending: Radiation Oncology | Admitting: Radiation Oncology

## 2016-03-15 ENCOUNTER — Ambulatory Visit: Payer: Medicaid Other | Admitting: Physical Therapy

## 2016-03-27 ENCOUNTER — Encounter: Payer: Medicaid Other | Admitting: Hematology

## 2016-03-27 ENCOUNTER — Other Ambulatory Visit: Payer: Self-pay | Admitting: *Deleted

## 2016-03-27 ENCOUNTER — Telehealth: Payer: Self-pay | Admitting: Hematology

## 2016-03-27 ENCOUNTER — Other Ambulatory Visit: Payer: Medicaid Other

## 2016-03-27 ENCOUNTER — Encounter: Payer: Self-pay | Admitting: Hematology

## 2016-03-27 NOTE — Progress Notes (Signed)
No show  This encounter was created in error - please disregard.

## 2016-03-27 NOTE — Telephone Encounter (Signed)
left msg confirming 6/1 apt ° °

## 2016-03-28 ENCOUNTER — Encounter: Payer: Self-pay | Admitting: Hematology

## 2016-03-28 ENCOUNTER — Telehealth: Payer: Self-pay | Admitting: *Deleted

## 2016-03-28 ENCOUNTER — Encounter: Payer: Medicaid Other | Admitting: Hematology

## 2016-03-28 ENCOUNTER — Other Ambulatory Visit: Payer: Medicaid Other

## 2016-03-28 NOTE — Telephone Encounter (Signed)
Pt was NO SHOW for appt today.   Called pt on cell phone unsuccessfully - phone just ringing and no voice mail.   Called pt on home phone and left message requesting a call back to nurse .

## 2016-03-28 NOTE — Progress Notes (Signed)
No show  This encounter was created in error - please disregard.

## 2016-03-29 ENCOUNTER — Telehealth: Payer: Self-pay | Admitting: Hematology

## 2016-03-29 NOTE — Telephone Encounter (Signed)
per pfo to sch pt appt-cld pt and left message to call and r/s only if pt will keep * confirm appt per Dr Burr Medico

## 2016-03-29 NOTE — Telephone Encounter (Signed)
Clamensia-please return call to pt @ [phone] for appt scheduling. Thank you1

## 2016-04-01 ENCOUNTER — Ambulatory Visit: Payer: Medicaid Other | Attending: Radiation Oncology | Admitting: Physical Therapy

## 2016-04-01 DIAGNOSIS — M25612 Stiffness of left shoulder, not elsewhere classified: Secondary | ICD-10-CM | POA: Insufficient documentation

## 2016-04-01 DIAGNOSIS — M25512 Pain in left shoulder: Secondary | ICD-10-CM | POA: Insufficient documentation

## 2016-04-01 DIAGNOSIS — I890 Lymphedema, not elsewhere classified: Secondary | ICD-10-CM | POA: Diagnosis present

## 2016-04-01 NOTE — Therapy (Signed)
[place], [place], [address] Phone: [phone]   Fax:  [phone]  Physical Therapy Evaluation  Patient Details  Name: Yolanda White MRN: [id number] Date of Birth: 14-Aug-1971 Referring Provider: Dr. Pablo Ledger  Encounter Date: 04/01/2016      PT End of Session - 04/01/16 1527    Visit Number 1   Number of Visits 4   Date for PT Re-Evaluation 05/13/16   PT Start Time [id number]   PT Stop Time 1430   PT Time Calculation (min) 43 min   Activity Tolerance Patient tolerated treatment well   Behavior During Therapy [facility] for tasks assessed/performed      Past Medical History  Diagnosis Date  . Asthma   . Seasonal allergies   . Breast cancer of upper-inner quadrant of left female breast ([place]) 09/08/2015  . Breast cancer ([place])   . Anxiety   . Depression     Past Surgical History  Procedure Laterality Date  . Abdominal hysterectomy    . Tubal ligation    . Radioactive seed guided mastectomy with axillary sentinel lymph node biopsy Left 10/11/2015    Procedure: RADIOACTIVE SEED LOCALIZATION LEFT BREAST LUMPECTOMY WITH LEFT  AXILLARY SENTINEL LYMPH NODE BIOPSY;  Surgeon: Excell Seltzer, MD;  Location: [facility];  Service: General;  Laterality: Left;    There were no vitals filed for this visit.       Subjective Assessment - 04/01/16 1353    Subjective Pt reports she has been very overwhelmed recently with all of her appointments and she lost her father to cancer at the same time she was diagnosed. She reports left breast pain and left arm pain and swelling. Pt reports it does not hurt everyday but when it does hurt it hurts all day. She also reports if she tries to lift her hand above her head it feels very painful.    Pertinent History pt has history of ceroma and had it drained early this year in left breast, left breast cancer with hx of lumpectomy in Dec 2016 followed by  radiation which pt completed in May 2017, invasive ductal carcinoma, 3cm, LVI(-), margins negative, grade 1, 3 nodes (-)   Patient Stated Goals to get better, to get pain under control   Currently in Pain? Yes   Pain Score 6    Pain Location Chest   Pain Orientation Left   Pain Descriptors / Indicators Throbbing            [facility] PT Assessment - 04/01/16 0001    Assessment   Medical Diagnosis left breast cancer   Referring Provider Dr. Pablo Ledger   Onset Date/Surgical Date 10/11/15   Hand Dominance Right   Prior Therapy none   Precautions   Precautions Other (comment)  lymphedema   Restrictions   Weight Bearing Restrictions No   Balance Screen   Has the patient fallen in the past 6 months No   Has the patient had a decrease in activity level because of a fear of falling?  No   Is the patient reluctant to leave their home because of a fear of falling?  No  reluctant to leave house secondary to [place];Other relatives   Available Help at Discharge Family   Type of [place] to enter   Entrance Stairs-Number of Steps 8   Entrance Stairs-Rails  Can reach both   Home Layout Two level;Bed/bath upstairs   Alternate Level Stairs-Number of Steps 15   Alternate Level Stairs-Rails Right   Home Equipment None   Prior Function   Level of Independence Independent with basic ADLs  requires assist to wash hair and get dressed   Vocation Unemployed  currently not working    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 and then pt's arm started swelling   Leisure pt reports she is very sedentary secondary to depression and anxiety   Cognition   Overall Cognitive Status Within Functional Limits for tasks assessed   Observation/Other Assessments   Other Surveys  --  LLIS- pt only  answered 8 questions with 72% impairment   AROM   Right Shoulder Flexion 165 Degrees   Right Shoulder ABduction 175 Degrees   Right Shoulder Internal Rotation 58 Degrees   Right Shoulder External Rotation 90 Degrees   Left Shoulder Flexion 150 Degrees   Left Shoulder ABduction 125 Degrees   Left Shoulder Internal Rotation 19 Degrees   Left Shoulder External Rotation 74 Degrees           LYMPHEDEMA/ONCOLOGY QUESTIONNAIRE - 04/01/16 1409    Type   Cancer Type left breast cancer   Surgeries   Lumpectomy Date 10/11/15   Sentinel Lymph Node Biopsy Date 10/11/15   Number Lymph Nodes Removed 3   Date Lymphedema/Swelling Started   Date 03/21/16   Treatment   Past Radiation Treatment Yes   Date 03/10/16   Current Hormone Treatment No   Past Hormone Therapy No   What other symptoms do you have   Are you Having Heaviness or Tightness Yes   Are you having Pain Yes   Are you having pitting edema Yes   Body Site left upper arm   Is it Hard or Difficult finding clothes that fit Yes   Do you have infections No   Is there Decreased scar mobility No   Lymphedema Assessments   Lymphedema Assessments Upper extremities   Right Upper Extremity Lymphedema   15 cm Proximal to Olecranon Process 38 cm   Olecranon Process 28.6 cm   15 cm Proximal to Ulnar Styloid Process 28.5 cm   Just Proximal to Ulnar Styloid Process 17.5 cm   Across Hand at [employer] 21.5 cm   At [place] of 2nd Digit 6.5 cm   Left Upper Extremity Lymphedema   15 cm Proximal to Olecranon Process 37.8 cm   Olecranon Process 28 cm   15 cm Proximal to Ulnar Styloid Process 28.5 cm   Just Proximal to Ulnar Styloid Process 18.1 cm   Across Hand at [employer] 20.8 cm   At [place] of 2nd Digit 6.4 cm                [facility] Adult PT Treatment/Exercise - 04/01/16 0001    Manual Therapy   Manual Therapy Edema management   Edema Management cut piece of TG soft for pt to wear on LUE for management of edema                 PT Education - 04/01/16 1524    Education provided Yes   Education Details wear TG soft to increase comfort, risk reduction for lymphedema, corner stretch   Person(s) Educated Patient   Methods Explanation;Demonstration   Comprehension Verbalized understanding;Returned demonstration  [facility] Goals - 04/01/16 1533    CC Long Term Goal  #1   Title Pt will be independent in self manual lymphatic drainage for long term management of edema.   Time 4   Period Weeks   Status New   CC Long Term Goal  #2   Title Pt will receive a trial of FlexiTouch compression pump for long term management of edema.    Time 4   Period Weeks   Status New   CC Long Term Goal  #3   Title Pt will be independent in a home program for strengthening and ROM of left shoulder and UE   Time 4   Period Weeks   Status New   CC Long Term Goal  #4   Title Pt will be able to independently verbalize lymphedema risk reduction policies   Time 4   Period Weeks   Status New            Plan - 04/01/16 1527    Clinical Impression Statement Patient has left breast cancer and underwent a left lumpectomy with sentinel lymph node biopsy in Dec 2016. 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 Pt states her arm began swelling after that. She has since quit her job due to the physical demands. She has breast and arm pain on the left with left UE swelling. She also presents with decreased ROM of left shoulder and pain with movement. She has increased tightness of left pec major muscle.    Rehab Potential Good   Clinical Impairments Affecting Rehab Potential pt states she has a hard time leaving her home secondary to anxiety   PT Frequency 1x / week   PT Duration 4 weeks   PT Treatment/Interventions Manual techniques;Manual lymph drainage;Therapeutic exercise;Taping;Passive range of motion;DME Instruction;Patient/family  education;Scar mobilization;Compression bandaging;ADLs/Self Care Home Management;Vasopneumatic Device   PT Next Visit Plan begin instructing self MLD, teach ROM HEP for LUE   PT Home Exercise Plan corner stretch   Recommended Other Services FlexiTouch compression pump and compression sleeve and glove - will send facesheet for both   Consulted and Agree with Plan of Care Patient      Patient will benefit from skilled therapeutic intervention in order to improve the following deficits and impairments:  Decreased range of motion, Pain, Impaired UE functional use, Increased fascial restricitons, Decreased strength, Decreased knowledge of use of DME, Decreased knowledge of precautions, Increased edema  Visit Diagnosis: Lymphedema, not elsewhere classified - Plan: PT plan of care cert/re-cert  Stiffness of left shoulder, not elsewhere classified - Plan: PT plan of care cert/re-cert  Pain in left shoulder - Plan: PT plan of care cert/re-cert     Problem List Patient Active Problem List   Diagnosis Date Noted  . Asthma 02/06/2016  . Asthma exacerbation 02/06/2016  . Emesis   . Morbid obesity ([place])   . Genetic testing 09/26/2015  . Cigarette smoker 09/21/2015  . Severe obesity (BMI >= 40) ([place]) 09/21/2015  . Dyspnea 09/20/2015  . Breast cancer of upper-inner quadrant of left female breast ([place]) 09/08/2015    Alexia Freestone 04/01/2016, 3:43 PM  [place], [place], [address] Phone: [phone]   Fax:  [phone]  Name: KERISA CARNATHAN MRN: [id number] Date of Birth: 12-29-1970   Allyson Sabal, PT 04/01/2016 3:43 PM

## 2016-04-01 NOTE — Patient Instructions (Signed)
Flexibility: Corner Stretch    Standing in corner with hands just above shoulder level and feet __10-12__ inches from corner, lean forward until a comfortable stretch is felt across chest. Hold _30___ seconds. Repeat _5___ times per set. Do __1__ sets per session. Do _2___ sessions per day.  http://orth.exer.us/343   Copyright  VHI. All rights reserved.

## 2016-04-02 ENCOUNTER — Encounter (HOSPITAL_COMMUNITY): Payer: Self-pay | Admitting: Emergency Medicine

## 2016-04-02 ENCOUNTER — Emergency Department (HOSPITAL_COMMUNITY)
Admission: EM | Admit: 2016-04-02 | Discharge: 2016-04-03 | Disposition: A | Discharge status: Home / Self Care | Payer: Medicaid Other | Attending: Emergency Medicine | Admitting: Emergency Medicine

## 2016-04-02 DIAGNOSIS — N63 Unspecified lump in unspecified breast: Secondary | ICD-10-CM

## 2016-04-02 DIAGNOSIS — J45909 Unspecified asthma, uncomplicated: Secondary | ICD-10-CM | POA: Insufficient documentation

## 2016-04-02 DIAGNOSIS — F329 Major depressive disorder, single episode, unspecified: Secondary | ICD-10-CM | POA: Diagnosis not present

## 2016-04-02 DIAGNOSIS — N644 Mastodynia: Secondary | ICD-10-CM | POA: Diagnosis present

## 2016-04-02 DIAGNOSIS — Z853 Personal history of malignant neoplasm of breast: Secondary | ICD-10-CM | POA: Insufficient documentation

## 2016-04-02 DIAGNOSIS — Z79899 Other long term (current) drug therapy: Secondary | ICD-10-CM | POA: Insufficient documentation

## 2016-04-02 DIAGNOSIS — J069 Acute upper respiratory infection, unspecified: Secondary | ICD-10-CM | POA: Diagnosis not present

## 2016-04-02 DIAGNOSIS — F17210 Nicotine dependence, cigarettes, uncomplicated: Secondary | ICD-10-CM | POA: Diagnosis not present

## 2016-04-02 NOTE — ED Notes (Signed)
Pt comes to ed, c/o of left rotating shoulder soreness and pain. Pain verbalized 10 out 10. Pt has a recent left breast lumpectomy, and lymph node removal(3 nodes) . Pt has finishing radiation tx in may. Cancer free. Pt has not had a check up since surgery.  Pt see's her oncology doc/ dr Morey Hummingbird and [place] regularly. Pt reports no numbness but fingers sometimes tingle. Pt describes her pain a surging pain. Pts left ear and thoat also hurt, and she reports cold chills and congestion.

## 2016-04-03 MED ORDER — OXYCODONE-ACETAMINOPHEN 5-325 MG PO TABS: 2.0000 | ORAL_TABLET | Freq: Four times a day (QID) | ORAL | Status: DC | PRN

## 2016-04-03 MED ORDER — OXYCODONE-ACETAMINOPHEN 5-325 MG PO TABS
2.0000 | ORAL_TABLET | Freq: Once | ORAL | Status: AC
  Administered 2016-04-03: 2 via ORAL
  Filled 2016-04-03: qty 2

## 2016-04-03 NOTE — ED Notes (Signed)
NP at bedside.

## 2016-04-03 NOTE — ED Notes (Signed)
Patient was alert, oriented and stable upon discharge. RN went over AVS and patient had no further questions.  

## 2016-04-03 NOTE — Discharge Instructions (Signed)
Call the breast center first thing in the morning to make arrangements for a mammogram Call your oncologist as well

## 2016-04-03 NOTE — ED Provider Notes (Signed)
CSN: [id number]     Arrival date & time 04/02/16  2250 History   First MD Initiated Contact with Patient 04/03/16 0111     Chief Complaint  Patient presents with  . Breast Pain     (Consider location/radiation/quality/duration/timing/severity/associated sxs/prior Treatment) HPI Comments: Patient has a history of left breast cancer.  She had lumpectomy.  She has been receiving radiation therapy as well as chemotherapy.  She developed lymphedema of the left arm up into the shoulder.  Tonight she presents with a new painful lump in her left breast increased pain in her shoulder is now radiating to her ear.  She also has URI symptoms with runny nose and congestion.  Denies any fever, nausea, vomiting.  She also states that she ran out of her pain medicine several days ago  The history is provided by the patient.    Past Medical History  Diagnosis Date  . Asthma   . Seasonal allergies   . Breast cancer of upper-inner quadrant of left female breast ([place]) 09/08/2015  . Breast cancer ([place])   . Anxiety   . Depression    Past Surgical History  Procedure Laterality Date  . Abdominal hysterectomy    . Tubal ligation    . Radioactive seed guided mastectomy with axillary sentinel lymph node biopsy Left 10/11/2015    Procedure: RADIOACTIVE SEED LOCALIZATION LEFT BREAST LUMPECTOMY WITH LEFT  AXILLARY SENTINEL LYMPH NODE BIOPSY;  Surgeon: Excell Seltzer, MD;  Location: [facility];  Service: General;  Laterality: Left;   Family History  Problem Relation Age of Onset  . Diabetes Mother   . Diabetes Father   . Lung cancer Father 41    smoker  . Cancer Paternal Aunt     cervical  . Diabetes Brother   . Heart attack Paternal Grandmother     in her 33s  . Diabetes Brother     maternal half brother  . Asthma Brother   . Asthma Maternal Aunt    Social History  Substance Use Topics  . Smoking status: Current Every Day Smoker -- 0.50 packs/day for 34 years    Types: Cigarettes   . Smokeless tobacco: Never Used  . Alcohol Use: No   OB History    Gravida Para Term Preterm AB TAB SAB Ectopic Multiple Living   4 3 3  1     3      Review of Systems  Constitutional: Negative for fever.  HENT: Positive for congestion and ear pain.   Respiratory: Negative for shortness of breath.   Musculoskeletal: Negative for joint swelling.  Skin: Negative for color change.  Neurological: Negative for numbness.  All other systems reviewed and are negative.     Allergies  Review of patient's allergies indicates no known allergies.  Home Medications   Prior to Admission medications   Medication Sig Start Date End Date Taking? Authorizing Provider  albuterol (VENTOLIN HFA) 108 (90 BASE) MCG/ACT inhaler Inhale 2 puffs into the lungs every 6 (six) hours as needed for wheezing or shortness of breath. Reported on 11/16/2015    Historical Provider, MD  docusate sodium (COLACE) 100 MG capsule Take 1 capsule (100 mg total) by mouth 2 (two) times daily. Patient not taking: Reported on 04/01/2016 02/09/16   Donne Hazel, MD  ibuprofen (ADVIL,MOTRIN) 200 MG tablet Take 800 mg by mouth every 8 (eight) hours as needed.    Historical Provider, MD  ketorolac (TORADOL) 10 MG tablet Take 1 tablet (10 mg  total) by mouth every 6 (six) hours as needed. Patient not taking: Reported on 03/13/2016 02/09/16   Donne Hazel, MD  lactulose (CEPHULAC) 20 g packet Take 1 packet (20 g total) by mouth 3 (three) times daily. 02/14/16   Truitt Merle, MD  oxyCODONE-acetaminophen (PERCOCET/ROXICET) 5-325 MG tablet Take 2 tablets by mouth every 6 (six) hours as needed for severe pain. 04/03/16   Junius Creamer, NP  polyethylene glycol (MIRALAX / GLYCOLAX) packet Take 17 g by mouth daily. 02/09/16   Donne Hazel, MD  tamoxifen (NOLVADEX) 20 MG tablet Take 1 tablet (20 mg total) by mouth daily. Patient not taking: Reported on 03/13/2016 02/14/16   Truitt Merle, MD  traZODone (DESYREL) 50 MG tablet Take 50 mg by mouth at bedtime.     Historical Provider, MD   BP 112/93 mmHg  Pulse 99  Temp(Src) 98.2 F (36.8 C) (Oral)  Resp 21  SpO2 100% Physical Exam  Constitutional: She appears well-developed and well-nourished.  HENT:  Head: Normocephalic.  Eyes: Pupils are equal, round, and reactive to light.  Neck: Normal range of motion.  Cardiovascular: Normal rate and regular rhythm.   Pulmonary/Chest: Effort normal and breath sounds normal.    Musculoskeletal: She exhibits edema and tenderness.  Neurological: She is alert.  Skin: Skin is warm.  Nursing note and vitals reviewed.   ED Course  Procedures (including critical care time) Labs Review Labs Reviewed - No data to display  Imaging Review No results found. I have personally reviewed and evaluated these images and lab results as part of my medical decision-making.   EKG Interpretation None     Breast mass, tender, without redness or fluctuance.  I feel the patient would benefit most from repeat mammogram.  She has stable lymphedema to the left arm, left axilla.  She has developed URI symptoms, I will renew her prescription for Percocet and order an outpatient mammogram.  She will contact her oncologist in the morning MDM   Final diagnoses:  Breast mass in female  URI (upper respiratory infection)         Junius Creamer, NP 04/03/16 [place], MD 04/03/16 [phone]

## 2016-04-09 ENCOUNTER — Ambulatory Visit: Payer: Medicaid Other | Admitting: Physical Therapy

## 2016-04-09 ENCOUNTER — Encounter: Payer: Self-pay | Admitting: Physical Therapy

## 2016-04-09 DIAGNOSIS — M25512 Pain in left shoulder: Secondary | ICD-10-CM

## 2016-04-09 DIAGNOSIS — M25612 Stiffness of left shoulder, not elsewhere classified: Secondary | ICD-10-CM

## 2016-04-09 DIAGNOSIS — I890 Lymphedema, not elsewhere classified: Secondary | ICD-10-CM

## 2016-04-09 NOTE — Patient Instructions (Signed)

## 2016-04-09 NOTE — Therapy (Signed)
[place], [place], [address] Phone: [phone]   Fax:  [phone]  Physical Therapy Treatment  Patient Details  Name: Yolanda White MRN: [id number] Date of Birth: 08/09/1971 Referring Provider: Dr. Pablo Ledger  Encounter Date: 04/09/2016      PT End of Session - 04/09/16 1542    Visit Number 2   Number of Visits 4   Date for PT Re-Evaluation 05/13/16   PT Start Time [id number]   PT Stop Time 1430   PT Time Calculation (min) 45 min   Activity Tolerance Patient tolerated treatment well   Behavior During Therapy [facility] for tasks assessed/performed      Past Medical History  Diagnosis Date  . Asthma   . Seasonal allergies   . Breast cancer of upper-inner quadrant of left female breast ([place]) 09/08/2015  . Breast cancer ([place])   . Anxiety   . Depression     Past Surgical History  Procedure Laterality Date  . Abdominal hysterectomy    . Tubal ligation    . Radioactive seed guided mastectomy with axillary sentinel lymph node biopsy Left 10/11/2015    Procedure: RADIOACTIVE SEED LOCALIZATION LEFT BREAST LUMPECTOMY WITH LEFT  AXILLARY SENTINEL LYMPH NODE BIOPSY;  Surgeon: Excell Seltzer, MD;  Location: [place];  Service: General;  Laterality: Left;    There were no vitals filed for this visit.      Subjective Assessment - 04/09/16 1535    Subjective Pt went to ED last week due to shoulder and breast pain. She says that ever pain she has causes anxiety that cancer has returned.  She is supposed to get another mammogram and will call Dr. Burr Medico for referral as there was a mix up a the breast center.  She says she has been measured for her compression sleeve and bra and has been referred for Flexitouch.  she has been using her tg soft, but it ripped and won't stay up now.    Pertinent History pt has history of ceroma and had it drained early this year in left breast, left breast cancer  with hx of lumpectomy in Dec 2016 followed by radiation which pt completed in May 2017, invasive ductal carcinoma, 3cm, LVI(-), margins negative, grade 1, 3 nodes (-)   Patient Stated Goals to get better, to get pain under control   Currently in Pain? No/denies                         [facility] Adult PT Treatment/Exercise - 04/09/16 0001    Self-Care   Self-Care Other Self-Care Comments   Other Self-Care Comments  issued infomration from ABC class and encouraged pt to call and register    Shoulder Exercises: Supine   Protraction AROM;Left;10 reps   External Rotation AROM;Left;10 reps   Other Supine Exercises dowel rod exercise,    Other Supine Exercises verbally reviewed other exercise in ABC packet    Manual Therapy   Manual Therapy Edema management;Manual Lymphatic Drainage (MLD)   Edema Management cut piece of TG soft for pt to wear on LUE for management of edema encouraged pt to keep a deep fold in th top of it to prevent rolling down    Manual Lymphatic Drainage (MLD) Instructed pt in and performed, short neck, diphragmatic breathing, anterior interaxillary anastamosis, left lateral anastamosis, left breast and abdominal area, left upper and lower arm.  PT Education - 04/09/16 1541    Education provided Yes   Education Details self manual lymph drainage, shoulder active range of motion    Person(s) Educated Patient   Methods Explanation;Demonstration;Handout   Comprehension Verbalized understanding;Returned demonstration                [facility] Goals - 04/01/16 1533    CC Long Term Goal  #1   Title Pt will be independent in self manual lymphatic drainage for long term management of edema.   Time 4   Period Weeks   Status New   CC Long Term Goal  #2   Title Pt will receive a trial of FlexiTouch compression pump for long term management of edema.    Time 4   Period Weeks   Status New   CC Long Term Goal  #3   Title Pt  will be independent in a home program for strengthening and ROM of left shoulder and UE   Time 4   Period Weeks   Status New   CC Long Term Goal  #4   Title Pt will be able to independently verbalize lymphedema risk reduction policies   Time 4   Period Weeks   Status New            Plan - 04/09/16 1542    Clinical Impression Statement Pt continues to have pain in left shoulder and in breast and has anxiety about it. Encouraged pt to do more shoulder exercise and to follow up with mammogram. She will try self manual lymph drainage and wear tg soft for comfort    Rehab Potential Good   Clinical Impairments Affecting Rehab Potential pt states she has a hard time leaving her home secondary to anxiety, previous radiation    PT Next Visit Plan review self MLD, and ROM HEP for LUE  check to make sure pt has gotten her mammogram as recommended by ED MD   Consulted and Agree with Plan of Care Patient      Patient will benefit from skilled therapeutic intervention in order to improve the following deficits and impairments:  Decreased range of motion, Pain, Impaired UE functional use, Increased fascial restricitons, Decreased strength, Decreased knowledge of use of DME, Decreased knowledge of precautions, Increased edema  Visit Diagnosis: Lymphedema, not elsewhere classified  Pain in left shoulder  Stiffness of left shoulder, not elsewhere classified     Problem List Patient Active Problem List   Diagnosis Date Noted  . Asthma 02/06/2016  . Asthma exacerbation 02/06/2016  . Emesis   . Morbid obesity ([place])   . Genetic testing 09/26/2015  . Cigarette smoker 09/21/2015  . Severe obesity (BMI >= 40) ([place]) 09/21/2015  . Dyspnea 09/20/2015  . Breast cancer of upper-inner quadrant of left female breast ([place]) 09/08/2015   Donato Heinz. Owens Shark PT  Norwood Levo 04/09/2016, 3:45 PM  [place],  [place], [address] Phone: [phone]   Fax:  [phone]  Name: Yolanda White MRN: [id number] Date of Birth: 05-16-1971

## 2016-04-10 ENCOUNTER — Encounter: Payer: Self-pay | Admitting: Physical Therapy

## 2016-04-16 ENCOUNTER — Telehealth: Payer: Self-pay | Admitting: Hematology

## 2016-04-16 ENCOUNTER — Ambulatory Visit: Payer: Medicaid Other | Admitting: Physical Therapy

## 2016-04-16 DIAGNOSIS — M25612 Stiffness of left shoulder, not elsewhere classified: Secondary | ICD-10-CM

## 2016-04-16 DIAGNOSIS — I890 Lymphedema, not elsewhere classified: Secondary | ICD-10-CM

## 2016-04-16 DIAGNOSIS — M25512 Pain in left shoulder: Secondary | ICD-10-CM

## 2016-04-16 NOTE — Therapy (Signed)
[place], [place], [address] Phone: [phone]   Fax:  [phone]  Physical Therapy Treatment  Patient Details  Name: Yolanda White MRN: [id number] Date of Birth: [birth year] Referring Provider: Dr. Pablo Ledger  Encounter Date: 04/16/2016      PT End of Session - 04/16/16 1713    Visit Number 3   Number of Visits 4   Date for PT Re-Evaluation 05/13/16   PT Start Time [id number]   PT Stop Time 1645   PT Time Calculation (min) 40 min   Activity Tolerance Patient tolerated treatment well   Behavior During Therapy [facility] for tasks assessed/performed      Past Medical History  Diagnosis Date  . Asthma   . Seasonal allergies   . Breast cancer of upper-inner quadrant of left female breast ([place]) 09/08/2015  . Breast cancer ([place])   . Anxiety   . Depression     Past Surgical History  Procedure Laterality Date  . Abdominal hysterectomy    . Tubal ligation    . Radioactive seed guided mastectomy with axillary sentinel lymph node biopsy Left 10/11/2015    Procedure: RADIOACTIVE SEED LOCALIZATION LEFT BREAST LUMPECTOMY WITH LEFT  AXILLARY SENTINEL LYMPH NODE BIOPSY;  Surgeon: Excell Seltzer, MD;  Location: [place];  Service: General;  Laterality: Left;    There were no vitals filed for this visit.      Subjective Assessment - 04/16/16 1608    Subjective Pt had 2 root canals last week and has been taking an antibiotic, she doesn't know the dosage,  Dr. Burr Medico is [place] so pt has appt with another oncologist in July  Pt is very  verbal about her stresses at home and that she doesn't have the time to take care of herself    Pertinent History pt has history of ceroma and had it drained early this year in left breast, left breast cancer with hx of lumpectomy in Dec 2016 followed by radiation which pt completed in May 2017, invasive ductal carcinoma, 3cm, LVI(-), margins negative,  grade 1, 3 nodes (-)   Patient Stated Goals to get better, to get pain under control   Currently in Pain? Yes   Pain Score --  did not rate   Pain Location Mouth  she says her mouth hurt worse than her shoulder    Pain Descriptors / Indicators Aching                         [facility] Adult PT Treatment/Exercise - 04/16/16 0001    Neck Exercises: Seated   Other Seated Exercise neck AROM with shoulder rolls    Shoulder Exercises: Supine   Protraction AROM;Left;10 reps   Shoulder Exercises: Sidelying   External Rotation AROM;Left;10 reps   ABduction AROM;Left;10 reps   Other Sidelying Exercises small circles 5 in each direction    Manual Therapy   Manual Lymphatic Drainage (MLD) Instructed pt in and performed, short neck, diphragmatic breathing, anterior interaxillary anastamosis, left lateral anastamosis, left breast and abdominal area, left upper and lower arm.                          [facility] Goals - 04/01/16 1533    CC Long Term Goal  #1   Title Pt will be independent in self manual lymphatic drainage for long term management of edema.  Time 4   Period Weeks   Status New   CC Long Term Goal  #2   Title Pt will receive a trial of FlexiTouch compression pump for long term management of edema.    Time 4   Period Weeks   Status New   CC Long Term Goal  #3   Title Pt will be independent in a home program for strengthening and ROM of left shoulder and UE   Time 4   Period Weeks   Status New   CC Long Term Goal  #4   Title Pt will be able to independently verbalize lymphedema risk reduction policies   Time 4   Period Weeks   Status New            Plan - 04/16/16 1714    Clinical Impression Statement Pt appears under stress from mouth procedures and pain and family demands at home.  She has not yet heard about her compression bra and sleeve. She says she has not been doing her exercise for her shoulders because of her mouth surgery     Clinical Impairments Affecting Rehab Potential pt states she has a hard time leaving her home secondary to anxiety, previous radiation    PT Next Visit Plan review self MLD, and ROM HEP for LUE check on garments delivery do quick dash and LLIS for discharge    Consulted and Agree with Plan of Care Patient      Patient will benefit from skilled therapeutic intervention in order to improve the following deficits and impairments:  Decreased range of motion, Pain, Impaired UE functional use, Increased fascial restricitons, Decreased strength, Decreased knowledge of use of DME, Decreased knowledge of precautions, Increased edema  Visit Diagnosis: Lymphedema, not elsewhere classified  Pain in left shoulder  Stiffness of left shoulder, not elsewhere classified     Problem List Patient Active Problem List   Diagnosis Date Noted  . Asthma 02/06/2016  . Asthma exacerbation 02/06/2016  . Emesis   . Morbid obesity ([place])   . Genetic testing 09/26/2015  . Cigarette smoker 09/21/2015  . Severe obesity (BMI >= 40) ([place]) 09/21/2015  . Dyspnea 09/20/2015  . Breast cancer of upper-inner quadrant of left female breast ([place]) 09/08/2015   Donato Heinz. Owens Shark PT   Norwood Levo 04/16/2016, 5:17 PM  [place], [place], [address] Phone: [phone]   Fax:  [phone]  Name: Yolanda White MRN: [id number] Date of Birth: 19-Feb-1971

## 2016-04-16 NOTE — Telephone Encounter (Signed)
pt cld to make an appt-gave pt time & date of appt 7/10@1 

## 2016-04-17 ENCOUNTER — Encounter: Payer: Self-pay | Admitting: *Deleted

## 2016-04-17 ENCOUNTER — Encounter: Payer: Self-pay | Admitting: Physical Therapy

## 2016-04-17 ENCOUNTER — Other Ambulatory Visit: Payer: Self-pay | Admitting: *Deleted

## 2016-04-17 DIAGNOSIS — C50212 Malignant neoplasm of upper-inner quadrant of left female breast: Secondary | ICD-10-CM

## 2016-04-17 MED ORDER — TAMOXIFEN CITRATE 20 MG PO TABS: 20.0000 mg | ORAL_TABLET | Freq: Every day | ORAL | Status: DC

## 2016-04-17 NOTE — Progress Notes (Signed)
Returned call to McClure at [phone] who called about a compression sleeve and bra order.  Left a message that I would call him back again tomorrow because she does not have an order.  I will check with the physical therapist tomorrow about the need for the compression sleeve and bra.

## 2016-04-18 ENCOUNTER — Encounter: Payer: Self-pay | Admitting: *Deleted

## 2016-04-18 NOTE — Progress Notes (Signed)
Spoke with [employer] rep needing Medicaid prior authorization form completed for compression sleeve and bra for a patient of Dr. Unknown Jim.  Will refax the forms to [phone] today for completion they were faxed to [phone].

## 2016-04-23 ENCOUNTER — Ambulatory Visit: Payer: Medicaid Other | Admitting: Physical Therapy

## 2016-04-23 DIAGNOSIS — I890 Lymphedema, not elsewhere classified: Secondary | ICD-10-CM

## 2016-04-23 DIAGNOSIS — M25612 Stiffness of left shoulder, not elsewhere classified: Secondary | ICD-10-CM

## 2016-04-23 DIAGNOSIS — M25512 Pain in left shoulder: Secondary | ICD-10-CM

## 2016-04-23 NOTE — Therapy (Signed)
[place], [place], [address] Phone: [phone]   Fax:  [phone]  Physical Therapy Treatment  Patient Details  Name: Yolanda White MRN: [id number] Date of Birth: February 05, 1971 Referring Provider: Dr. Pablo Ledger  Encounter Date: 04/23/2016      PT End of Session - 04/23/16 1702    Visit Number 4   Number of Visits 4   Date for PT Re-Evaluation 05/13/16   PT Start Time 1430   PT Stop Time 1515   PT Time Calculation (min) 45 min   Activity Tolerance Patient tolerated treatment well   Behavior During Therapy [facility] for tasks assessed/performed      Past Medical History  Diagnosis Date  . Asthma   . Seasonal allergies   . Breast cancer of upper-inner quadrant of left female breast ([place]) 09/08/2015  . Breast cancer ([place])   . Anxiety   . Depression     Past Surgical History  Procedure Laterality Date  . Abdominal hysterectomy    . Tubal ligation    . Radioactive seed guided mastectomy with axillary sentinel lymph node biopsy Left 10/11/2015    Procedure: RADIOACTIVE SEED LOCALIZATION LEFT BREAST LUMPECTOMY WITH LEFT  AXILLARY SENTINEL LYMPH NODE BIOPSY;  Surgeon: Excell Seltzer, MD;  Location: [place];  Service: General;  Laterality: Left;    There were no vitals filed for this visit.      Subjective Assessment - 04/23/16 1441    Subjective Pt says she is feeling a little better. She continues to have emotional stressors in her life.  She has been trying to do manual lymph drainage but continue so have swelling in her arm and chest.  She has not received her compression garments    Pertinent History pt has history of ceroma and had it drained early this year in left breast, left breast cancer with hx of lumpectomy in Dec 2016 followed by radiation which pt completed in May 2017, invasive ductal carcinoma, 3cm, LVI(-), margins negative, grade 1, 3 nodes (-)   Patient Stated  Goals to get better, to get pain under control   Currently in Pain? Yes  bothers her when she moves it.    Pain Score 6    Pain Location Shoulder   Pain Orientation Left   Pain Descriptors / Indicators Sharp;Shooting   Pain Type Chronic pain   Pain Radiating Towards occasionally has pain in numbess in hand , can't hold anything too ong    Pain Onset More than a month ago   Pain Frequency Intermittent   Aggravating Factors  worse with motion    Pain Relieving Factors medicine and rest             [facility] PT Assessment - 04/23/16 0001    AROM   Left Shoulder Flexion 153 Degrees   Left Shoulder ABduction 153 Degrees   Left Shoulder Internal Rotation 75 Degrees   Left Shoulder External Rotation 60 Degrees           LYMPHEDEMA/ONCOLOGY QUESTIONNAIRE - 04/23/16 1445    Left Upper Extremity Lymphedema   15 cm Proximal to Olecranon Process 41 cm   Olecranon Process 28 cm   15 cm Proximal to Ulnar Styloid Process 28.5 cm   Just Proximal to Ulnar Styloid Process 18.5 cm   Across Hand at [employer] 21 cm   At [place] of 2nd Digit 6.3 cm  [facility] Adult PT Treatment/Exercise - 04/23/16 0001    Neck Exercises: Seated   Shoulder Flexion Both;5 reps   Shoulder Flexion Limitations narrow and wide grip.    Upper Extremity D1 Flexion;5 reps;Theraband  both arms   Theraband Level (UE D1) Level 2 (Red)   Shoulder Exercises: Supine   Horizontal ABduction Strengthening;Both;5 reps   Theraband Level (Shoulder Horizontal ABduction) Level 1 (Yellow)   External Rotation Strengthening;Both;5 reps   Theraband Level (Shoulder External Rotation) Level 1 (Yellow)   Flexion Strengthening;Both;5 reps;Theraband   Theraband Level (Shoulder Flexion) Level 2 (Red)   Flexion Limitations narrow and wide grip    Other Supine Exercises diagonal elevation with external rotation with yellow band x 5 reps with each arm    Manual Therapy   Manual Lymphatic Drainage (MLD) reviewed  self manul lymph draianage.                 PT Education - 04/23/16 1702    Education provided Yes   Education Details scapular strengthening exercise    Person(s) Educated Patient   Methods Demonstration;Explanation;Handout   Comprehension Verbalized understanding;Returned demonstration                [facility] Goals - 04/23/16 1706    CC Long Term Goal  #1   Title Pt will be independent in self manual lymphatic drainage for long term management of edema.   Status Achieved   CC Long Term Goal  #2   Title Pt will receive a trial of FlexiTouch compression pump for long term management of edema.    Status Achieved   CC Long Term Goal  #3   Title Pt will be independent in a home program for strengthening and ROM of left shoulder and UE   Status Achieved   CC Long Term Goal  #4   Title Pt will be able to independently verbalize lymphedema risk reduction policies   Status Achieved            Plan - 04/23/16 1703    Clinical Impression Statement Pt continues to have visible lymhedema in left breast and lateral chest with eft upper arm circumference  increase despite self manua lymph drianage at home and use of a sports bra.  She has used all her authorized visits for the year.  She will need a  Flexitouch for use at home to manage the breast , upper trunk and arm lymphedema on her own at home.    Clinical Impairments Affecting Rehab Potential pt states she has a hard time leaving her home secondary to anxiety, previous radiation    PT Treatment/Interventions Manual techniques;Manual lymph drainage;Therapeutic exercise;Taping;Passive range of motion;DME Instruction;Patient/family education;Scar mobilization;Compression bandaging;ADLs/Self Care Home Management;Vasopneumatic Device   PT Next Visit Plan discharge this episode    Consulted and Agree with Plan of Care Patient      Patient will benefit from skilled therapeutic intervention in order to improve the  following deficits and impairments:  Decreased range of motion, Pain, Impaired UE functional use, Increased fascial restricitons, Decreased strength, Decreased knowledge of use of DME, Decreased knowledge of precautions, Increased edema  Visit Diagnosis: Lymphedema, not elsewhere classified  Pain in left shoulder  Stiffness of left shoulder, not elsewhere classified     Problem List Patient Active Problem List   Diagnosis Date Noted  . Asthma 02/06/2016  . Asthma exacerbation 02/06/2016  . Emesis   . Morbid obesity ([place])   . Genetic testing 09/26/2015  .  Cigarette smoker 09/21/2015  . Severe obesity (BMI >= 40) ([place]) 09/21/2015  . Dyspnea 09/20/2015  . Breast cancer of upper-inner quadrant of left female breast ([place]) 09/08/2015     PHYSICAL THERAPY DISCHARGE SUMMARY  Visits from Start of Care: 4  Current functional level related to goals / functional outcomes: Improved shoulder range of motion, intermittent pain    Remaining deficits: Breast, trunk and upper arm lymphedema, shoulder pain   Education / Equipment: Self manual lymph drainage, home exercise  Plan: Patient agrees to discharge.  Patient goals were met. Patient is being discharged due to meeting the stated rehab goals. completed authorized visits?          Donato Heinz. Owens Shark PT  Norwood Levo 04/23/2016, 5:07 PM  [place], [place], [address] Phone: [phone]   Fax:  [phone]  Name: Yolanda White MRN: [id number] Date of Birth: 1970-11-26

## 2016-04-23 NOTE — Patient Instructions (Addendum)
Over Head Pull: Narrow Grip        On back, knees bent, feet flat, band across thighs, elbows straight but relaxed. Pull hands apart (start). Keeping elbows straight, bring arms up and over head, hands toward floor. Keep pull steady on band. Hold momentarily. Return slowly, keeping pull steady, back to start. Repeat _5-10__ times. Band color __yellow___   Side Pull: Double Arm   On back, knees bent, feet flat. Arms perpendicular to body, shoulder level, elbows straight but relaxed. Pull arms out to sides, elbows straight. Resistance band comes across collarbones, hands toward floor. Hold momentarily. Slowly return to starting position. Repeat 5-10___ times. Band color yellow ___   Sash   On back, knees bent, feet flat, left hand on left hip, right hand above left. Pull right arm DIAGONALLY (hip to shoulder) across chest. Bring right arm along head toward floor. Hold momentarily. Slowly return to starting position. Repeat 5-10___ times. Do with left arm. Band color __yellow___   Shoulder Rotation: Double Arm   On back, knees bent, feet flat, elbows tucked at sides, bent 90, hands palms up. Pull hands apart and down toward floor, keeping elbows near sides. Hold momentarily. Slowly return to starting position. Repeat _5-10__ times. Band color __yellow____

## 2016-04-24 ENCOUNTER — Encounter: Payer: Self-pay | Admitting: Physical Therapy

## 2016-04-25 ENCOUNTER — Encounter: Payer: Self-pay | Admitting: Nurse Practitioner

## 2016-04-25 ENCOUNTER — Ambulatory Visit
Admission: RE | Admit: 2016-04-25 | Discharge: 2016-04-25 | Disposition: A | Discharge status: Home / Self Care | Payer: Medicaid Other | Source: Ambulatory Visit | Attending: Radiation Oncology | Admitting: Radiation Oncology

## 2016-04-25 ENCOUNTER — Telehealth: Payer: Self-pay | Admitting: Adult Health

## 2016-04-25 ENCOUNTER — Encounter: Payer: Medicaid Other | Admitting: Nurse Practitioner

## 2016-04-25 ENCOUNTER — Telehealth: Payer: Self-pay | Admitting: *Deleted

## 2016-04-25 DIAGNOSIS — C50212 Malignant neoplasm of upper-inner quadrant of left female breast: Secondary | ICD-10-CM

## 2016-04-25 NOTE — Telephone Encounter (Signed)
Called patient to inform that fu appt. For 04-25-16 has been rescheduled for 05-16-16 @ 11:30 am with Dr. Tammi Klippel, lvm for a return call

## 2016-04-25 NOTE — Progress Notes (Signed)
The Survivorship Care Plan was mailed to Yolanda White as she reported not being able to come in to the Survivorship Clinic for an in-person visit at this time. A letter was mailed to her outlining the purpose of the content of the care plan, as well as encouraging her to reach out to me with any questions or concerns.  My business card was included in the correspondence to the patient as well.  A copy of the care plan was also routed/faxed/mailed to Yolanda Fendt, MD, the patient's PCP.  I will not be placing any follow-up appointments to the Survivorship Clinic for Yolanda White, but I am happy to see her at any time in the future for any survivorship concerns that may arise. Thank you for allowing me to participate in her care!  Kenn File, [place] [phone]

## 2016-04-25 NOTE — Telephone Encounter (Signed)
I received a transferred call from Ms. Kalbach stating that she "missed some appointments this morning and the survivorship class."    I looked at Ms. Stukey's appt calender and it appears that she missed her 8:50 am follow-up appt with Dr. Tammi Klippel, as well as her survivorship visit with [name], NP.  I explained that her survivorship visit today was not a class, but was an office visit with our breast survivorship NP. I explained the purpose/goals of the survivorship visit and why it was important for her.  I explained that she would receive a survivorship care plan document, detailing her treatment and what to expect in the future.    She voiced that she would like to have materials mailed to her in lieu of rescheduling her survivorship visit.  I let her know that she should really see her radiation oncology physician for follow-up since completing treatment and she agreed.  I have notified Enid Derry in [place] to help get her appt with Dr. Tammi Klippel rescheduled.   I will forward this message to [name], NP to  Make her aware.   Mike Craze, NP [place] [phone]'

## 2016-05-05 ENCOUNTER — Encounter (HOSPITAL_COMMUNITY): Payer: Self-pay

## 2016-05-05 ENCOUNTER — Other Ambulatory Visit: Payer: Self-pay | Admitting: Hematology

## 2016-05-05 ENCOUNTER — Emergency Department (HOSPITAL_COMMUNITY)
Admission: EM | Admit: 2016-05-05 | Discharge: 2016-05-05 | Disposition: A | Discharge status: Home / Self Care | Payer: Medicaid Other | Attending: Emergency Medicine | Admitting: Emergency Medicine

## 2016-05-05 DIAGNOSIS — R197 Diarrhea, unspecified: Secondary | ICD-10-CM | POA: Diagnosis not present

## 2016-05-05 DIAGNOSIS — F17210 Nicotine dependence, cigarettes, uncomplicated: Secondary | ICD-10-CM | POA: Diagnosis not present

## 2016-05-05 DIAGNOSIS — J45909 Unspecified asthma, uncomplicated: Secondary | ICD-10-CM | POA: Diagnosis not present

## 2016-05-05 DIAGNOSIS — R1110 Vomiting, unspecified: Secondary | ICD-10-CM | POA: Diagnosis not present

## 2016-05-05 DIAGNOSIS — Z79899 Other long term (current) drug therapy: Secondary | ICD-10-CM | POA: Diagnosis not present

## 2016-05-05 DIAGNOSIS — R1013 Epigastric pain: Secondary | ICD-10-CM | POA: Insufficient documentation

## 2016-05-05 DIAGNOSIS — Z853 Personal history of malignant neoplasm of breast: Secondary | ICD-10-CM | POA: Diagnosis not present

## 2016-05-05 DIAGNOSIS — R109 Unspecified abdominal pain: Secondary | ICD-10-CM | POA: Diagnosis present

## 2016-05-05 LAB — CBC
HEMATOCRIT: 40.3 % (ref 36.0–46.0)
Hemoglobin: 13.5 g/dL (ref 12.0–15.0)
MCH: 28.3 pg (ref 26.0–34.0)
MCHC: 33.5 g/dL (ref 30.0–36.0)
MCV: 84.5 fL (ref 78.0–100.0)
Platelets: 208 10*3/uL (ref 150–400)
RBC: 4.77 MIL/uL (ref 3.87–5.11)
RDW: 12.8 % (ref 11.5–15.5)
WBC: 11.1 10*3/uL — AB (ref 4.0–10.5)

## 2016-05-05 LAB — COMPREHENSIVE METABOLIC PANEL
ALBUMIN: 3.3 g/dL — AB (ref 3.5–5.0)
ALT: 15 U/L (ref 14–54)
AST: 15 U/L (ref 15–41)
Alkaline Phosphatase: 86 U/L (ref 38–126)
Anion gap: 7 (ref 5–15)
BUN: 10 mg/dL (ref 6–20)
CHLORIDE: 105 mmol/L (ref 101–111)
CO2: 22 mmol/L (ref 22–32)
Calcium: 8.5 mg/dL — ABNORMAL LOW (ref 8.9–10.3)
Creatinine, Ser: 1.01 mg/dL — ABNORMAL HIGH (ref 0.44–1.00)
GFR calc Af Amer: >60 mL/min (ref >60)
GLUCOSE: 101 mg/dL — AB (ref 65–99)
POTASSIUM: 4.3 mmol/L (ref 3.5–5.1)
SODIUM: 134 mmol/L — AB (ref 135–145)
Total Bilirubin: 0.9 mg/dL (ref 0.3–1.2)
Total Protein: 6.7 g/dL (ref 6.5–8.1)

## 2016-05-05 LAB — URINE MICROSCOPIC-ADD ON
Bacteria, UA: NONE SEEN
RBC / HPF: NONE SEEN RBC/hpf (ref 0–5)
WBC UA: NONE SEEN WBC/hpf (ref 0–5)

## 2016-05-05 LAB — URINALYSIS, ROUTINE W REFLEX MICROSCOPIC
Bilirubin Urine: NEGATIVE
Glucose, UA: NEGATIVE mg/dL
KETONES UR: NEGATIVE mg/dL
LEUKOCYTES UA: NEGATIVE
NITRITE: NEGATIVE
PH: 5.5 (ref 5.0–8.0)
PROTEIN: NEGATIVE mg/dL
Specific Gravity, Urine: 1.018 (ref 1.005–1.030)

## 2016-05-05 LAB — LIPASE, BLOOD: LIPASE: 21 U/L (ref 11–51)

## 2016-05-05 MED ORDER — DICYCLOMINE HCL 20 MG PO TABS: 20.0000 mg | ORAL_TABLET | Freq: Two times a day (BID) | ORAL | Status: DC | PRN

## 2016-05-05 MED ORDER — ONDANSETRON 4 MG PO TBDP
4.0000 mg | ORAL_TABLET | Freq: Three times a day (TID) | ORAL | Status: DC | PRN
Start: 2016-05-05 — End: 2016-09-16

## 2016-05-05 MED ORDER — DICYCLOMINE HCL 10 MG PO CAPS
10.0000 mg | ORAL_CAPSULE | Freq: Once | ORAL | Status: AC
  Administered 2016-05-05: 10 mg via ORAL
  Filled 2016-05-05: qty 1

## 2016-05-05 MED ORDER — SODIUM CHLORIDE 0.9 % IV BOLUS (SEPSIS)
1000.0000 mL | Freq: Once | INTRAVENOUS | Status: AC
  Administered 2016-05-05: 1000 mL via INTRAVENOUS

## 2016-05-05 MED ORDER — GI COCKTAIL ~~LOC~~
30.0000 mL | Freq: Once | ORAL | Status: AC
  Administered 2016-05-05: 30 mL via ORAL
  Filled 2016-05-05: qty 30

## 2016-05-05 MED ORDER — ONDANSETRON 4 MG PO TBDP
4.0000 mg | ORAL_TABLET | Freq: Once | ORAL | Status: AC
  Administered 2016-05-05: 4 mg via ORAL
  Filled 2016-05-05: qty 1

## 2016-05-05 MED ORDER — ONDANSETRON HCL 4 MG/2ML IJ SOLN
4.0000 mg | Freq: Once | INTRAMUSCULAR | Status: DC
  Filled 2016-05-05: qty 2

## 2016-05-05 NOTE — Discharge Instructions (Signed)

## 2016-05-05 NOTE — ED Provider Notes (Signed)
CSN: [id number]     Arrival date & time 05/05/16  1726 History   First MD Initiated Contact with Patient 05/05/16 1936     Chief Complaint  Patient presents with  . Abdominal Pain  . Emesis     (Consider location/radiation/quality/duration/timing/severity/associated sxs/prior Treatment) HPI Comments: 45 year old female with past mental history including anxiety/depression, breast cancer, asthma who presents with abdominal pain, vomiting, and diarrhea. Early this morning, the patient began vomiting and later developed pain just above her umbilicus. The pain has been constant, crampy, and severe in nature. She has had multiple episodes of vomiting and has noticed some streaks of blood but no large volumes of blood. She has had several episodes of diarrhea, nonbloody. No sick contacts, new foods, or recent travel. No fevers. No chest pain or shortness of breath. She took ibuprofen and Pepto-Bismol without relief.  Patient is a 45 y.o. female presenting with abdominal pain and vomiting. The history is provided by the patient.  Abdominal Pain Associated symptoms: vomiting   Emesis Associated symptoms: abdominal pain     Past Medical History  Diagnosis Date  . Asthma   . Seasonal allergies   . Breast cancer of upper-inner quadrant of left female breast ([place]) 09/08/2015  . Breast cancer ([place])   . Anxiety   . Depression    Past Surgical History  Procedure Laterality Date  . Abdominal hysterectomy    . Tubal ligation    . Radioactive seed guided mastectomy with axillary sentinel lymph node biopsy Left 10/11/2015    Procedure: RADIOACTIVE SEED LOCALIZATION LEFT BREAST LUMPECTOMY WITH LEFT  AXILLARY SENTINEL LYMPH NODE BIOPSY;  Surgeon: Excell Seltzer, MD;  Location: [facility];  Service: General;  Laterality: Left;   Family History  Problem Relation Age of Onset  . Diabetes Mother   . Diabetes Father   . Lung cancer Father 78    smoker  . Cancer Paternal Aunt    cervical  . Diabetes Brother   . Heart attack Paternal Grandmother     in her 17s  . Diabetes Brother     maternal half brother  . Asthma Brother   . Asthma Maternal Aunt    Social History  Substance Use Topics  . Smoking status: Current Every Day Smoker -- 0.50 packs/day for 34 years    Types: Cigarettes  . Smokeless tobacco: Never Used  . Alcohol Use: No   OB History    Gravida Para Term Preterm AB TAB SAB Ectopic Multiple Living   4 3 3  1     3      Review of Systems  Gastrointestinal: Positive for vomiting and abdominal pain.   10 Systems reviewed and are negative for acute change except as noted in the HPI.   Allergies  Review of patient's allergies indicates no known allergies.  Home Medications   Prior to Admission medications   Medication Sig Start Date End Date Taking? Authorizing Provider  albuterol (VENTOLIN HFA) 108 (90 BASE) MCG/ACT inhaler Inhale 2 puffs into the lungs every 6 (six) hours as needed for wheezing or shortness of breath. Reported on 11/16/2015   Yes Historical Provider, MD  bismuth subsalicylate (PEPTO BISMOL) 262 MG chewable tablet Chew 524 mg by mouth once.   Yes Historical Provider, MD  ibuprofen (ADVIL,MOTRIN) 800 MG tablet Take 800 mg by mouth 3 (three) times daily as needed (pain).  04/17/16  Yes Historical Provider, MD  lactulose (CEPHULAC) 20 g packet Take 1 packet (20 g total)  by mouth 3 (three) times daily. Patient taking differently: Take 20 g by mouth daily as needed (constipation).  02/14/16  Yes Truitt Merle, MD  penicillin v potassium (VEETID) 500 MG tablet Take 500 mg by mouth daily. 04/10/16  Yes Historical Provider, MD  polyethylene glycol (MIRALAX / GLYCOLAX) packet Take 17 g by mouth daily. Patient taking differently: Take 17 g by mouth daily as needed (constipation).  02/09/16  Yes Donne Hazel, MD  tamoxifen (NOLVADEX) 20 MG tablet Take 1 tablet (20 mg total) by mouth daily. 04/17/16  Yes Truitt Merle, MD  traZODone (DESYREL) 50 MG  tablet Take 50 mg by mouth at bedtime.   Yes Historical Provider, MD  dicyclomine (BENTYL) 20 MG tablet Take 1 tablet (20 mg total) by mouth 2 (two) times daily as needed for spasms (abdominal cramping). 05/05/16   Sharlett Iles, MD  docusate sodium (COLACE) 100 MG capsule Take 1 capsule (100 mg total) by mouth 2 (two) times daily. Patient not taking: Reported on 04/01/2016 02/09/16   Donne Hazel, MD  ketorolac (TORADOL) 10 MG tablet Take 1 tablet (10 mg total) by mouth every 6 (six) hours as needed. Patient not taking: Reported on 03/13/2016 02/09/16   Donne Hazel, MD  ondansetron (ZOFRAN ODT) 4 MG disintegrating tablet Take 1 tablet (4 mg total) by mouth every 8 (eight) hours as needed for nausea or vomiting. 05/05/16   Sharlett Iles, MD  oxyCODONE-acetaminophen (PERCOCET/ROXICET) 5-325 MG tablet Take 2 tablets by mouth every 6 (six) hours as needed for severe pain. Patient not taking: Reported on 05/05/2016 04/03/16   Junius Creamer, NP   BP 106/65 mmHg  Pulse 79  Temp(Src) 98.9 F (37.2 C) (Oral)  Resp 16  Ht 5\' 2"  (1.575 m)  Wt 240 lb (108.863 kg)  BMI 43.89 kg/m2  SpO2 100% Physical Exam  Constitutional: She is oriented to person, place, and time. She appears well-developed and well-nourished. No distress.  Uncomfortable, rubbing abdomen  HENT:  Head: Normocephalic and atraumatic.  Moist mucous membranes  Eyes: Conjunctivae are normal. Pupils are equal, round, and reactive to light.  Neck: Neck supple.  Cardiovascular: Normal rate, regular rhythm and normal heart sounds.   No murmur heard. Pulmonary/Chest: Effort normal and breath sounds normal.  Abdominal: Soft. Bowel sounds are normal. She exhibits no distension. There is no tenderness.  Musculoskeletal: She exhibits no edema.  Neurological: She is alert and oriented to person, place, and time.  Fluent speech  Skin: Skin is warm and dry.  Psychiatric: She has a normal mood and affect. Judgment normal.  Nursing note and  vitals reviewed.   ED Course  Procedures (including critical care time) Labs Review Labs Reviewed  COMPREHENSIVE METABOLIC PANEL - Abnormal; Notable for the following:    Sodium 134 (*)    Glucose, Bld 101 (*)    Creatinine, Ser 1.01 (*)    Calcium 8.5 (*)    Albumin 3.3 (*)    All other components within normal limits  CBC - Abnormal; Notable for the following:    WBC 11.1 (*)    All other components within normal limits  URINALYSIS, ROUTINE W REFLEX MICROSCOPIC (NOT AT [facility]) - Abnormal; Notable for the following:    Hgb urine dipstick TRACE (*)    All other components within normal limits  URINE MICROSCOPIC-ADD ON - Abnormal; Notable for the following:    Squamous Epithelial / LPF 0-5 (*)    All other components within normal limits  LIPASE, BLOOD    Imaging Review No results found. I have personally reviewed and evaluated these lab results as part of my medical decision-making.   EKG Interpretation None     Medications  sodium chloride 0.9 % bolus 1,000 mL (0 mLs Intravenous Stopped 05/05/16 2242)  dicyclomine (BENTYL) capsule 10 mg (10 mg Oral Given 05/05/16 2023)  gi cocktail (Maalox,Lidocaine,Donnatal) (30 mLs Oral Given 05/05/16 2023)  ondansetron (ZOFRAN-ODT) disintegrating tablet 4 mg (4 mg Oral Given 05/05/16 2058)    MDM   Final diagnoses:  Epigastric pain  Vomiting and diarrhea   PT p/w vomiting, diarrhea, and upper abdominal pain that began this morning. She was uncomfortable but nontoxic on exam with stable vital signs. No focal abdominal tenderness on exam. Obtained above lab work which showed unremarkable LFTs and lipase. WBC 11.1. Given no focal abdominal tenderness and the fact that her pain is near midepigastrium, I feel that she does not need any CT imaging at this time. Gave the patient Zofran, GI cocktail, and Bentyl after which she was PO challenged. Patient able to tolerate ginger ale with no vomiting. On reexamination, she stated that her nausea had  resolved but she continued to have epigastric cramping. I instructed on supportive care including liquids and mild diet until symptoms improve. Provided with Zofran and Bentyl to use as needed at home. Extensively reviewed return precautions including worsening abdominal pain, lower abdominal pain, fever, intractable vomiting, or any other new symptoms. Patient voiced understanding and was discharged in satisfactory condition.  Sharlett Iles, MD 05/05/16 [phone]

## 2016-05-05 NOTE — ED Notes (Signed)
Patient complains of umbilicus pain with vomiting and diarrhea since early am. States that she now is vomiting bile, no emesis on arrival

## 2016-05-06 ENCOUNTER — Other Ambulatory Visit (HOSPITAL_BASED_OUTPATIENT_CLINIC_OR_DEPARTMENT_OTHER): Payer: Medicaid Other

## 2016-05-06 ENCOUNTER — Telehealth: Payer: Self-pay | Admitting: Hematology

## 2016-05-06 ENCOUNTER — Encounter: Payer: Self-pay | Admitting: Hematology

## 2016-05-06 ENCOUNTER — Ambulatory Visit (HOSPITAL_BASED_OUTPATIENT_CLINIC_OR_DEPARTMENT_OTHER): Payer: Medicaid Other | Admitting: Hematology

## 2016-05-06 VITALS — BP 111/66 | HR 75 | Temp 97.7°F | Resp 18 | Ht 62.0 in | Wt 245.8 lb

## 2016-05-06 DIAGNOSIS — M545 Low back pain: Secondary | ICD-10-CM

## 2016-05-06 DIAGNOSIS — Z720 Tobacco use: Secondary | ICD-10-CM | POA: Diagnosis not present

## 2016-05-06 DIAGNOSIS — E669 Obesity, unspecified: Secondary | ICD-10-CM | POA: Diagnosis not present

## 2016-05-06 DIAGNOSIS — M549 Dorsalgia, unspecified: Secondary | ICD-10-CM | POA: Insufficient documentation

## 2016-05-06 DIAGNOSIS — C50212 Malignant neoplasm of upper-inner quadrant of left female breast: Secondary | ICD-10-CM | POA: Diagnosis present

## 2016-05-06 DIAGNOSIS — G8929 Other chronic pain: Secondary | ICD-10-CM | POA: Diagnosis not present

## 2016-05-06 DIAGNOSIS — F329 Major depressive disorder, single episode, unspecified: Secondary | ICD-10-CM

## 2016-05-06 LAB — CBC WITH DIFFERENTIAL/PLATELET
BASO%: 0.2 % (ref 0.0–2.0)
Basophils Absolute: 0 10*3/uL (ref 0.0–0.1)
EOS%: 7.6 % — AB (ref 0.0–7.0)
Eosinophils Absolute: 0.5 10*3/uL (ref 0.0–0.5)
HCT: 35.2 % (ref 34.8–46.6)
HGB: 12.2 g/dL (ref 11.6–15.9)
LYMPH#: 1.3 10*3/uL (ref 0.9–3.3)
LYMPH%: 22.5 % (ref 14.0–49.7)
MCH: 28.5 pg (ref 25.1–34.0)
MCHC: 34.7 g/dL (ref 31.5–36.0)
MCV: 82.2 fL (ref 79.5–101.0)
MONO#: 0.5 10*3/uL (ref 0.1–0.9)
MONO%: 7.6 % (ref 0.0–14.0)
NEUT%: 62.1 % (ref 38.4–76.8)
NEUTROS ABS: 3.7 10*3/uL (ref 1.5–6.5)
NRBC: 0 % (ref 0–0)
Platelets: 207 10*3/uL (ref 145–400)
RBC: 4.28 10*6/uL (ref 3.70–5.45)
RDW: 13 % (ref 11.2–14.5)
WBC: 6 10*3/uL (ref 3.9–10.3)

## 2016-05-06 LAB — COMPREHENSIVE METABOLIC PANEL
ALBUMIN: 3.2 g/dL — AB (ref 3.5–5.0)
ALK PHOS: 102 U/L (ref 40–150)
ALT: 13 U/L (ref 0–55)
AST: 14 U/L (ref 5–34)
Anion Gap: 8 mEq/L (ref 3–11)
BILIRUBIN TOTAL: 0.56 mg/dL (ref 0.20–1.20)
BUN: 7.8 mg/dL (ref 7.0–26.0)
CO2: 22 mEq/L (ref 22–29)
Calcium: 8.7 mg/dL (ref 8.4–10.4)
Chloride: 108 mEq/L (ref 98–109)
Creatinine: 1 mg/dL (ref 0.6–1.1)
EGFR: 69 mL/min/{1.73_m2} — ABNORMAL LOW (ref >90)
GLUCOSE: 93 mg/dL (ref 70–140)
POTASSIUM: 3.8 meq/L (ref 3.5–5.1)
SODIUM: 138 meq/L (ref 136–145)
TOTAL PROTEIN: 7 g/dL (ref 6.4–8.3)

## 2016-05-06 MED ORDER — SERTRALINE HCL 25 MG PO TABS: 25.0000 mg | ORAL_TABLET | Freq: Every day | ORAL | Status: DC

## 2016-05-06 MED ORDER — VENLAFAXINE HCL ER 75 MG PO CP24: 75.0000 mg | ORAL_CAPSULE | Freq: Every day | ORAL | Status: DC

## 2016-05-06 MED ORDER — TAMOXIFEN CITRATE 20 MG PO TABS: 20.0000 mg | ORAL_TABLET | Freq: Every day | ORAL | Status: DC

## 2016-05-06 NOTE — Telephone Encounter (Signed)
Pt confirmed appt and received avs °

## 2016-05-06 NOTE — Progress Notes (Signed)
[place]  [phone] [phone]  Clinic follow up Note   Patient Care Team: Nolene Ebbs, MD as PCP - General (Internal Medicine) Excell Seltzer, MD as Consulting Physician (General Surgery) Truitt Merle, MD as Consulting Physician (Hematology) Thea Silversmith, MD as Consulting Physician (Radiation Oncology) 05/06/2016  CHIEF COMPLAINTS:  Follow up left breast cancer.  Oncology History   Breast cancer of upper-inner quadrant of left female breast [facility])   Staging form: Breast, AJCC 7th Edition     Clinical: Stage IA (T1c, N0, M0) - Signed by Truitt Merle, MD on 09/13/2015     Pathologic: No stage assigned - Unsigned       Breast cancer of upper-inner quadrant of left female breast ([place])   08/29/2015 Mammogram Diagnostic mammo and US showed a 1.2 X 0.9 X 0.6cm mass in left breast 11:00 position    09/01/2015 Initial Biopsy left breast biopsy showed invasive ductal carcinoma, G1   09/01/2015 Receptors her2 ER 90%+, PR 90%+, HER2-, Ki67 25%   09/01/2015 Clinical Stage Stage IA: T1c N0   09/13/2015 Procedure MyRisk panel: no del mut at [employer], ATM, BARD1, BMPR1A, BRCA1, BRCA2, BRIP1, CHD1, CDK4, CDKN2A, CHEK2, EPCAM (large rearrangement only), MLH1, MSH2, MSH6, MUTYH, NBN, PALB2, PMS2, PTEN, RAD51C, RAD51D, SMAD4, STK11, and TP53.    10/11/2015 Surgery left breast lumpectomy and SLN biopsy   10/11/2015 Pathology Results invasive ductal carcinoma, 3cm, LVI(-), margins negative, grade 1, 3 nodes (-)   10/11/2015 Oncotype testing RS 17 (low risk), which predicts 10 year risk of distant recurrence with tamoxifen alone 11%   10/11/2015 Pathologic Stage Stage IIA: T2 N0   12/18/2015 - 02/15/2016 Radiation Therapy Adjuvant breast radiation: Left breast/ 45 Gy at 1.8 Gy per fraction x 25 fractions.  2. Left breast boost/ 16 Gy at 2 Gy per fraction x 8 fractions   02/2016 -  Anti-estrogen oral therapy Tamoxifen 20 mg daily. Planned duration of therapy 10 years.   04/25/2016 Survivorship SCP mailed to patient after missed appointment and request per pt   HISTORY OF PRESENTING ILLNESS:  Yolanda White 45 y.o. female is here because of her newly diagnosed left breast cancer. She is accompanied by her 2 daughters and one sister to our multidisciplinary breast clinic today.  She has not seen a primary care physician for many years. She presented with intermittent left breast pain for one month, and she was seen in the emergency room on 08/08/2015. She was referred to breast imaging center and had a mammogram done on 08/29/2015. Which showed a 1.2 cm mass in the left breast 11:00 position. She underwent core needle biopsy on 09/01/2015, which showed invasive ductal carcinoma, ER/PR stripe positive, HER-2 negative.  She has had lot of social and financial stress in the past year. She lives in a hotel now, her daughters live with her sister. She reports right-sided low back pain for the past few months, positional, she denies injury prior to that. She is also quite depressed and very anxious since the cancer diagnosis, she denies any significant dyspnea, GI symptoms or other new symptoms.  CURRENT THERAPY: Tamoxifen 70m dialy started in 02/2016  INTERIM HISTORY: Mrs. PEbarbreturns for follow-up. She Has been on tamoxifen for the past 5-6 weeks, reports moderate to severe hot flash, and most weaned. She reports significant is the stress at home, she has to take of 4 little grandchildren at home without much breaks, and she feels quite depressed sometime. She also reports intermittent left  breast pain, probably from the seroma. No significant joint discomfort. She developed significant nausea and vomiting yesterday morning when she woke up, and reports stream of blood in the vomitus. She presented to emergency room yesterday, and was discharged home. Her symptom slightly improved today, although she has not eaten much since yesterday. No fever or chills, no diarrhea. She  denies significant history of GI bleeding in the past.   MEDICAL HISTORY:  Past Medical History  Diagnosis Date  . Asthma   . Seasonal allergies   . Breast cancer of upper-inner quadrant of left female breast ([place]) 09/08/2015  . Breast cancer ([place])   . Anxiety   . Depression     SURGICAL HISTORY: Past Surgical History  Procedure Laterality Date  . Abdominal hysterectomy    . Tubal ligation    . Radioactive seed guided mastectomy with axillary sentinel lymph node biopsy Left 10/11/2015    Procedure: RADIOACTIVE SEED LOCALIZATION LEFT BREAST LUMPECTOMY WITH LEFT  AXILLARY SENTINEL LYMPH NODE BIOPSY;  Surgeon: Excell Seltzer, MD;  Location: [place];  Service: General;  Laterality: Left;   GYN HISTORY  Menarchal: 12 LMP: Hysterectomy in 2010  Contraceptive: she was on for one year , stopped 23 years ago  HRT: n/a G4  P3: she was 66 with her first child, (+) breast feeding    SOCIAL HISTORY: Social History   Social History  . Marital Status: Single    Spouse Name: N/A  . Number of Children: 3 daughters age 57-25  . Years of Education: N/A   Occupational History  . Not on file.   Social History Main Topics  . Smoking status: Current Every Day Smoker -- 0.30 packs/day    Types: Cigarettes  . Smokeless tobacco: Not on file  . Alcohol Use: No  . Drug Use: No  . Sexual Activity: Yes    Birth Control/ Protection: Surgical   Other Topics Concern  . Not on file   Social History Narrative    FAMILY HISTORY: Family History  Problem Relation Age of Onset  . Diabetes Mother   . Diabetes Father   . Lung cancer Father 37    smoker  . Cancer Paternal Aunt     cervical  . Diabetes Brother   . Heart attack Paternal Grandmother     in her 44s  . Diabetes Brother     maternal half brother  . Asthma Brother   . Asthma Maternal Aunt     ALLERGIES:  has No Known Allergies.  MEDICATIONS:  Current Outpatient Prescriptions  Medication Sig  Dispense Refill  . albuterol (VENTOLIN HFA) 108 (90 BASE) MCG/ACT inhaler Inhale 2 puffs into the lungs every 6 (six) hours as needed for wheezing or shortness of breath. Reported on 11/16/2015    . bismuth subsalicylate (PEPTO BISMOL) 262 MG chewable tablet Chew 524 mg by mouth once.    . dicyclomine (BENTYL) 20 MG tablet Take 1 tablet (20 mg total) by mouth 2 (two) times daily as needed for spasms (abdominal cramping). 10 tablet 0  . docusate sodium (COLACE) 100 MG capsule Take 1 capsule (100 mg total) by mouth 2 (two) times daily. (Patient not taking: Reported on 04/01/2016) 10 capsule 0  . ibuprofen (ADVIL,MOTRIN) 800 MG tablet Take 800 mg by mouth 3 (three) times daily as needed (pain).   5  . ketorolac (TORADOL) 10 MG tablet Take 1 tablet (10 mg total) by mouth every 6 (six) hours as needed. (Patient not taking:  Reported on 03/13/2016) 20 tablet 0  . lactulose (CEPHULAC) 20 g packet Take 1 packet (20 g total) by mouth 3 (three) times daily. (Patient taking differently: Take 20 g by mouth daily as needed (constipation). ) 30 each 0  . ondansetron (ZOFRAN ODT) 4 MG disintegrating tablet Take 1 tablet (4 mg total) by mouth every 8 (eight) hours as needed for nausea or vomiting. 8 tablet 0  . oxyCODONE-acetaminophen (PERCOCET/ROXICET) 5-325 MG tablet Take 2 tablets by mouth every 6 (six) hours as needed for severe pain. (Patient not taking: Reported on 05/05/2016) 30 tablet 0  . penicillin v potassium (VEETID) 500 MG tablet Take 500 mg by mouth daily.  0  . polyethylene glycol (MIRALAX / GLYCOLAX) packet Take 17 g by mouth daily. (Patient taking differently: Take 17 g by mouth daily as needed (constipation). ) 14 each 0  . sertraline (ZOLOFT) 25 MG tablet Take 1 tablet (25 mg total) by mouth daily. 60 tablet 2  . tamoxifen (NOLVADEX) 20 MG tablet Take 1 tablet (20 mg total) by mouth daily. 30 tablet 3  . traZODone (DESYREL) 50 MG tablet Take 50 mg by mouth at bedtime.     No current facility-administered  medications for this visit.    REVIEW OF SYSTEMS:   Constitutional: Denies fevers, chills or abnormal night sweats Eyes: Denies blurriness of vision, double vision or watery eyes Ears, nose, mouth, throat, and face: Denies mucositis or sore throat Respiratory: Denies cough, dyspnea or wheezes Cardiovascular: Denies palpitation, chest discomfort or lower extremity swelling Gastrointestinal:  Denies nausea, heartburn or change in bowel habits Skin: Denies abnormal skin rashes Lymphatics: Denies new lymphadenopathy or easy bruising Neurological:Denies numbness, tingling or new weaknesses Behavioral/Psych: Mood is stable, no new changes  All other systems were reviewed with the patient and are negative.  PHYSICAL EXAMINATION: ECOG PERFORMANCE STATUS: 1 - Symptomatic but completely ambulatory  Filed Vitals:   05/06/16 1431  BP: 111/66  Pulse: 75  Temp: 97.7 F (36.5 C)  Resp: 18   Filed Weights   05/06/16 1431  Weight: 245 lb 12.8 oz (111.494 kg)    GENERAL:alert, no distress and comfortable SKIN: skin color, texture, turgor are normal, no rashes or significant lesions EYES: normal, conjunctiva are pink and non-injected, sclera clear OROPHARYNX:no exudate, no erythema and lips, buccal mucosa, and tongue normal  NECK: supple, thyroid normal size, non-tender, without nodularity LYMPH:  no palpable lymphadenopathy in the cervical, axillary or inguinal LUNGS: clear to auscultation and percussion with normal breathing effort HEART: regular rate & rhythm and no murmurs and no lower extremity edema ABDOMEN:abdomen soft, non-tender and normal bowel sounds Musculoskeletal:no cyanosis of digits and no clubbing  PSYCH: alert & oriented x 3 with fluent speech NEURO: no focal motor/sensory deficits Breasts: Breast inspection showed them to be symmetrical with no nipple discharge. The surgical incision above the left breast nipple is well healed, (+)  Diffuse skin pigmentation in the left  breast, skin is intact, mild edema, exam of the both breasts and bilateral axillars was negative for mass or adenopathy.  LABORATORY DATA:  I have reviewed the data as listed Lab Results  Component Value Date   WBC 6.0 05/06/2016   HGB 12.2 05/06/2016   HCT 35.2 05/06/2016   MCV 82.2 05/06/2016   PLT 207 05/06/2016    Recent Labs  02/05/16 2310 02/06/16 1607 02/07/16 0838 02/14/16 1118 05/05/16 1804 05/06/16 1354  NA 138  --  140 136 134* 138  K 3.7  --  4.1 4.6 4.3 3.8  CL 104  --  110  --  105  --   CO2 22  --  '22 24 22 22  ' GLUCOSE 121*  --  110* 110 101* 93  BUN <5*  --  <5* 15.2 10 7.8  CREATININE 1.14* 1.17* 0.96 1.2* 1.01* 1.0  CALCIUM 9.3  --  8.5* 9.0 8.5* 8.7  GFRNONAA 58* 56* >60  --  >60  --   GFRAA >60 >60 >60  --  >60  --   PROT 7.8  --   --  7.0 6.7 7.0  ALBUMIN 3.8  --   --  3.4* 3.3* 3.2*  AST 24  --   --  '11 15 14  ' ALT 23  --   --  '18 15 13  ' ALKPHOS 105  --   --  84 86 102  BILITOT 1.2  --   --  0.48 0.9 0.56   PATHOLOGY REPORT: Diagnosis 10/11/2015  1. Breast, lumpectomy, Left - INVASIVE DUCTAL CARCINOMA, SEE COMMENT. - INVASIVE TUMOR IS 2 MM FROM THE NEAREST MARGIN (SUPERIOR). - PREVIOUS BIOPSY SITE - SEE TUMOR SYNOPTIC TEMPLATE BELOW 2. Lymph node, sentinel, biopsy, Left axillary - ONE LYMPH NODE, NEGATIVE FOR TUMOR (0/1). - PLEASE SEE COMMENT. 3. Lymph node, sentinel, biopsy, Left axillary #2 - ONE LYMPH NODE, NEGATIVE FOR TUMOR (0/1). - PLEASE SEE COMMENT. 4. Lymph node, sentinel, biopsy, Left axillary #3 - ONE LYMPH NODE, NEGATIVE FOR TUMOR (0/1) - PLEASE SEE COMMENT. Microscopic Comment 1. BREAST, INVASIVE TUMOR, WITH LYMPH NODES PRESENT Specimen, including laterality and lymph node sampling (sentinel, non-sentinel): Left breast with sentinel lymph node sampling. Procedure: Lumpectomy. Histologic type: Ductal Grade: 1 of 3 Tubule formation: 2 Nuclear pleomorphism: 2 Mitotic:1 Tumor size (gross measurement): 3.0  cm Margins: Invasive, distance to closest margin: 2 mm (superior) In-situ, distance to closest margin: N/A If margin positive, focally or broadly: N/A Lymphovascular invasion: Absent. Ductal carcinoma in situ: Absent. Grade: N/A Extensive intraductal component: N/A Lobular neoplasia: Absent. Tumor focality: Unifocal, see comment. Treatment effect: None If present, treatment effect in breast tissue, lymph nodes or both: N/A Extent of tumor: Skin: N/A Nipple: N/A Skeletal muscle: N/A Lymph nodes: Examined: 3 Sentinel 0 Non-sentinel 3 Total Lymph nodes with metastasis: 0 Isolated tumor cells (< 0.2 mm): N/A Micrometastasis: (> 0.2 mm and < 2.0 mm): N/A Macrometastasis: (> 2.0 mm): N/A Extracapsular extension: N/A Breast prognostic profile: Estrogen receptor: Not repeated, previous study demonstrated 90% positivity ([id number]) Progesterone receptor: Not repeated, previous study demonstrated 90% positivity ([id number]) Her 2 neu: Repeated previous study demonstrated no amplification (1.27) ([id number]) Ki-67: Not repeated previous study demonstrated 25% proliferation rate ([id number]) Non-neoplastic breast: Previous biopsy site tissue changes. TNM: pT2, pN0, pMX Comments: Representative sections from the 3.0 cm stellate lesion with associated ribbon shaped clip demonstrate diagnostic features of invasive ductal carcinoma. Within a representative section from firm tissue away from the grossly identified primary mass, there is invasive ductal caricnoma with extensive tissue fibrosis that is 2 mm fromtransfusion the nearest superior margin (slide 1H). Given that invasive tumor is infiltrating within grossly inapparent fibrous tissue, the invasive tumor is slide 1H is consider to represent grossly inapparent expansion of the primary 3.0cm mass and not multifocal disease. As such, the invasive tumor is considered to be at least 3.0cm in size. (CRR:gt, 10/13/15) 2. 3, and 4. There  are no intranodal malignant metastatic tumor deposits identified on routine histology or with cytokeratin AE1/3 immunostains.   Results: HER2 -  NEGATIVE RATIO OF HER2/CEP17 SIGNALS 1.18 AVERAGE HER2 COPY NUMBER PER CELL 2.30  ONCOTYPE DX RS 17 (low risk), which predicts 10 year risk of distant recurrence with tamoxifen alone 11%.  RADIOGRAPHIC STUDIES: I have personally reviewed the radiological images as listed and agreed with the findings in the report.  Mm Diag Breast Tomo Bilateral and [place]   08/29/2015  ADDENDUM REPORT: 08/29/2015 08:40 ADDENDUM: The left axilla was not evaluated during this exam. Left axillary ultrasound may be performed at the time of biopsy. Electronically Signed   By: Franki Cabot M.D.   On: 08/29/2015 08:40  08/29/2015  CLINICAL DATA:  Left breast lump EXAM: DIGITAL DIAGNOSTIC BILATERAL MAMMOGRAM WITH 3D TOMOSYNTHESIS WITH CAD ULTRASOUND LEFT BREAST COMPARISON:  Previous exam(s). ACR Breast Density Category b: There are scattered areas of fibroglandular density. FINDINGS: Bilateral CC and MLO views were obtained with 3D tomosynthesis. Additional spot compression view, with tomosynthesis, performed at the site of the palpable abnormality in the upper left breast which is demarcated with an overlying skin marker. There is architectural distortion with upper inner quadrant of the left breast, 11 o'clock axis region, corresponding to the site of palpable abnormality. There are no dominant masses, suspicious calcifications or secondary signs of malignancy within the right breast. Mammographic images were processed with CAD. On physical exam, skin retraction noted within the upper left breast. Targeted ultrasound is performed, showing an irregular shadowing mass within the left breast at the 11 o'clock axis, 7 cm from the nipple, measuring 1.2 x 0.9 x 0.6 cm, corresponding to the site of palpable abnormality and mammographic finding. IMPRESSION: 1. Irregular shadowing mass within  the left breast at the 11 o'clock axis, 7 cm from the nipple, measuring 1.2 x 0.9 x 0.6 cm, corresponding to the site of palpable abnormality and skin retraction, also corresponding to the area of architectural distortion on mammogram. This is a highly suspicious finding for which ultrasound-guided biopsy is recommended. 2.  No evidence of malignancy within the right breast. RECOMMENDATION: Ultrasound-guided biopsy for the irregular shadowing mass in the left breast at the 11 o'clock axis, 7 cm from the nipple, measuring 1.2 x 0.9 x 0.6 cm. Postprocedure mammogram to ensure mammographic and sonographic correlation. Ultrasound-guided biopsy is scheduled for November 4th at 8 a.m. I have discussed the findings and recommendations with the patient. Results were also provided in writing at the conclusion of the visit. If applicable, a reminder letter will be sent to the patient regarding the next appointment. BI-RADS CATEGORY  5: Highly suggestive of malignancy. Electronically Signed: By: Franki Cabot M.D. On: 08/25/2015 13:59     ASSESSMENT & PLAN: 45 year old female with past medical history of asthma, obesity, anxiety and depression, presented with left breast pain.  She had hysterectomy.   1. 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 -I discussed her Oncotype DX genomic test result. She has low risk recurrence score 17, which predicts of 11% 10 year risk of distant recurrence with tamoxifen alone. She will not benefit from adjuvant chemotherapy. -She has started adjuvant tamoxifen, developed moderate hot flashes and mood swing -I recommend her try Zoloft -She is agreeable to continue tamoxifen -Continue breast cancer surveillance, including annual mammogram, routine follow-up with lab and physical exam.   2.  Depression and mood swing -Likely due to tamoxifen and stress at home,  she is not suicidal -I recommend her to try Effexor 75 mg daily  3.  Chronic back pain - likely related to her morbid obesity and degenerative changes -improved some lately  - she  Will continue Tylenol and ibuprofen as needed, she does not want to have narcotics due to her constipation  4. Genetics -She is a very young, we recommended genetic testing for inheritable breast/ovarian cancer syndrome. She was tested for [facility] gene panel which was negative.  5. Asthma, anxiety and obesity  - she'll follow-up with her primary care physician   Plan -conitnue tamoxifen  -add Effexor 75 mg daily for hot flash and mood swing  - I'll see her back in 2 months - We'll set up survivorship clinic visit on next visit  All questions were answered. The patient knows to call the clinic with any problems, questions or concerns. I spent 25 minutes counseling the patient face to face. The total time spent in the appointment was 30 minutes and more than 50% was on counseling.     Truitt Merle, MD 05/06/2016 9:48 PM

## 2016-05-08 ENCOUNTER — Telehealth: Payer: Self-pay | Admitting: Radiation Oncology

## 2016-05-08 NOTE — Telephone Encounter (Signed)
Returned message left by patient. Patient inquiring about "why have I been switched from Dr. Pablo Ledger to Dr. Tammi Klippel." Explained that Dr. Pablo Ledger is on a leave of absence and to ensure she receives the most quality care Dr. Tammi Klippel will see her. She verbalized understanding. Confirmed 05/16/16 1130 follow up appointment with Dr. Tammi Klippel

## 2016-05-16 ENCOUNTER — Ambulatory Visit
Admission: RE | Admit: 2016-05-16 | Discharge: 2016-05-16 | Disposition: A | Discharge status: Home / Self Care | Payer: Medicaid Other | Source: Ambulatory Visit | Attending: Radiation Oncology | Admitting: Radiation Oncology

## 2016-05-16 ENCOUNTER — Encounter: Payer: Self-pay | Admitting: Radiation Oncology

## 2016-05-16 VITALS — BP 124/65 | HR 76 | Resp 18 | Wt 246.1 lb

## 2016-05-16 DIAGNOSIS — C50212 Malignant neoplasm of upper-inner quadrant of left female breast: Secondary | ICD-10-CM

## 2016-05-16 MED ORDER — OXYCODONE-ACETAMINOPHEN 5-325 MG PO TABS: 1.0000 | ORAL_TABLET | Freq: Four times a day (QID) | ORAL | Status: DC | PRN

## 2016-05-16 NOTE — Progress Notes (Signed)
Radiation Oncology         [phone] ________________________________  Name: Yolanda White MRN: [id number]  Date: 05/16/2016  DOB: 1970/12/29  Follow-Up Visit Note  CC: Philis Fendt, MD  Excell Seltzer, MD  Diagnosis:   Stage IIA, T2, N0, ER/PR positive, invasive ductal carcinoma of the left breast    ICD-9-CM ICD-10-CM   1. Breast cancer of upper-inner quadrant of left female breast (HCC) 174.2 C50.212     Interval Since Last Radiation: 2 months  12/18/2015-02/15/2016: 1. Left breast/ 45 Gy at 1.8 Gy per fraction x 25 fractions.  2. Left breast boost/ 16 Gy at 2 Gy per fraction x 8 fractions  Narrative:  The patient returns today for routine follow-up.  She completed radiotherapy and tolerated this pretty well. She did have some fatigue and desquamation during treatment. She did miss a few of her treatments as well as she struggled with depression around the anniversary of her father's death. Since radiotherapy completed, she has noticed improvement in her skin. She was started on Effexor for her depression and anxiety and is hopeful that this will begin to help her. She describes pain in her breast due to the swelling and is trying to arrange for compression garments to wear. She has met with PT but is limited for additional management of her lymphedema due to medicaid. She is trying to do lymphatic massage. No other complaints are verbalized.                       ALLERGIES:  has No Known Allergies.  Meds: Current Outpatient Prescriptions  Medication Sig Dispense Refill  . albuterol (VENTOLIN HFA) 108 (90 BASE) MCG/ACT inhaler Inhale 2 puffs into the lungs every 6 (six) hours as needed for wheezing or shortness of breath. Reported on 11/16/2015    . ibuprofen (ADVIL,MOTRIN) 800 MG tablet Take 800 mg by mouth 3 (three) times daily as needed (pain).   5  . lactulose (CEPHULAC) 20 g packet Take 1 packet (20 g total) by mouth 3 (three) times daily. 30 each 0  . tamoxifen  (NOLVADEX) 20 MG tablet Take 1 tablet (20 mg total) by mouth daily. 30 tablet 3  . venlafaxine XR (EFFEXOR-XR) 75 MG 24 hr capsule Take 1 capsule (75 mg total) by mouth daily with breakfast. 30 capsule 1  . bismuth subsalicylate (PEPTO BISMOL) 262 MG chewable tablet Chew 524 mg by mouth once. Reported on 05/16/2016    . dicyclomine (BENTYL) 20 MG tablet Take 1 tablet (20 mg total) by mouth 2 (two) times daily as needed for spasms (abdominal cramping). (Patient not taking: Reported on 05/16/2016) 10 tablet 0  . docusate sodium (COLACE) 100 MG capsule Take 1 capsule (100 mg total) by mouth 2 (two) times daily. (Patient not taking: Reported on 04/01/2016) 10 capsule 0  . ketorolac (TORADOL) 10 MG tablet Take 1 tablet (10 mg total) by mouth every 6 (six) hours as needed. (Patient not taking: Reported on 03/13/2016) 20 tablet 0  . ondansetron (ZOFRAN ODT) 4 MG disintegrating tablet Take 1 tablet (4 mg total) by mouth every 8 (eight) hours as needed for nausea or vomiting. (Patient not taking: Reported on 05/16/2016) 8 tablet 0  . oxyCODONE-acetaminophen (PERCOCET/ROXICET) 5-325 MG tablet Take 1-2 tablets by mouth every 6 (six) hours as needed for severe pain. 30 tablet 0  . penicillin v potassium (VEETID) 500 MG tablet Take 500 mg by mouth daily. Reported on 05/16/2016  0  .  polyethylene glycol (MIRALAX / GLYCOLAX) packet Take 17 g by mouth daily. (Patient not taking: Reported on 05/16/2016) 14 each 0  . traZODone (DESYREL) 50 MG tablet Take 50 mg by mouth at bedtime. Reported on 05/16/2016     No current facility-administered medications for this encounter.    Physical Findings:  weight is 246 lb 1.6 oz (111.63 kg). Her blood pressure is 124/65 and her pulse is 76. Her respiration is 18 and oxygen saturation is 100%.   In general this is a well appearing Hispanic female in no acute distress. She's alert and oriented x4 and appropriate throughout the examination. Cardiopulmonary assessment is negative for acute  distress and she exhibits normal effort. The left breast is assessed, and edematous with lymphedema of the left upper extremity and side wall of the left chest below the axilla. The patient notes pain with light palpation. Hyperpigmentation is noted without desquamation.    Lab Findings: Lab Results  Component Value Date   WBC 6.0 05/06/2016   WBC 11.1* 05/05/2016   HGB 12.2 05/06/2016   HGB 13.5 05/05/2016   HCT 35.2 05/06/2016   HCT 40.3 05/05/2016   PLT 207 05/06/2016   PLT 208 05/05/2016    Lab Results  Component Value Date   NA 138 05/06/2016   NA 134* 05/05/2016   K 3.8 05/06/2016   K 4.3 05/05/2016   CHLORIDE 108 05/06/2016   CO2 22 05/06/2016   CO2 22 05/05/2016   GLUCOSE 93 05/06/2016   GLUCOSE 101* 05/05/2016   BUN 7.8 05/06/2016   BUN 10 05/05/2016   CREATININE 1.0 05/06/2016   CREATININE 1.01* 05/05/2016   BILITOT 0.56 05/06/2016   BILITOT 0.9 05/05/2016   ALKPHOS 102 05/06/2016   ALKPHOS 86 05/05/2016   AST 14 05/06/2016   AST 15 05/05/2016   ALT 13 05/06/2016   ALT 15 05/05/2016   PROT 7.0 05/06/2016   PROT 6.7 05/05/2016   ALBUMIN 3.2* 05/06/2016   ALBUMIN 3.3* 05/05/2016   CALCIUM 8.7 05/06/2016   CALCIUM 8.5* 05/05/2016   ANIONGAP 8 05/06/2016   ANIONGAP 7 05/05/2016    Radiographic Findings: No results found.  Impression/Plan: 1. Stage IIA, T2, N0, ER/PR positive, invasive ductal carcinoma of the left breast. She has completed radiotherapy and done well with this. She continues also on tamoxifen under the care of Dr. Burr Medico. We would be happy to see her back in the future if she has any additional needs for radiotherapy. 2. Genetics. The patient has tested negative for [facility] Panel. she will remain in the database is additional testing is needed based on advances in this field.  3. Lymphedema of the left upper extremity and breast. The patient unfortunately is only given 3 visits for this due to her medicaid  status. She has ordered a  compression sleeve, and has been given an insert as well as recommendations for undergarments. Dr. Burr Medico will continue to monitor the patient for this.     Carola Rhine, PAC

## 2016-05-16 NOTE — Progress Notes (Signed)
Weight and vitals stable. Reports she feels like she has been punch in her ribs just below her left breast. Scheduled to follow up with Dr. Excell Seltzer on 7/28. Hasn't had mammogram since seen last nor is one scheduled. Reports sharp pain in her left arm and left nipple continue. Denies nipple discharge. Reports her left breast continues to ache as well. Reports she is out of percocet and taking motrin 800 mg "too often." Reports that Dr. Burr Medico gave her Effexor to help with mood swings and crying episodes. Scheduled to follow up with Dr. Burr Medico on 07/09/16. Reports a flexi touch, compression sleeve and glove are on order. Nasal congestion noted. Reports she has allergies but, no medication to take to manage them.   BP 124/65 mmHg  Pulse 76  Resp 18  Wt 246 lb 1.6 oz (111.63 kg)  SpO2 100% Wt Readings from Last 3 Encounters:  05/16/16 246 lb 1.6 oz (111.63 kg)  05/06/16 245 lb 12.8 oz (111.494 kg)  05/05/16 240 lb (108.863 kg)

## 2016-05-24 ENCOUNTER — Encounter: Payer: Self-pay | Admitting: Physical Therapy

## 2016-05-24 ENCOUNTER — Ambulatory Visit: Payer: Medicaid Other | Attending: Internal Medicine | Admitting: Physical Therapy

## 2016-05-24 DIAGNOSIS — M545 Low back pain, unspecified: Secondary | ICD-10-CM

## 2016-05-24 DIAGNOSIS — M79605 Pain in left leg: Secondary | ICD-10-CM | POA: Insufficient documentation

## 2016-05-24 NOTE — Therapy (Signed)
[facility] [address] [place], [place], [address] Phone: [phone]   Fax:  [phone]  Physical Therapy Evaluation  Patient Details  Name: Yolanda White MRN: [id number] Date of Birth: 11/22/1970 Referring Provider: Nolene Ebbs, MD  Encounter Date: 05/24/2016      PT End of Session - 05/24/16 1423    Visit Number 1   Number of Visits 1   Authorization Type Medicaid   PT Start Time 1320   PT Stop Time 1410   PT Time Calculation (min) 50 min   Activity Tolerance Patient limited by pain  in lower back   Behavior During Therapy [facility] for tasks assessed/performed      Past Medical History:  Diagnosis Date  . Anxiety   . Asthma   . Breast cancer ([place])   . Breast cancer of upper-inner quadrant of left female breast ([place]) 09/08/2015  . Depression   . Seasonal allergies     Past Surgical History:  Procedure Laterality Date  . ABDOMINAL HYSTERECTOMY    . RADIOACTIVE SEED GUIDED MASTECTOMY WITH AXILLARY SENTINEL LYMPH NODE BIOPSY Left 10/11/2015   Procedure: RADIOACTIVE SEED LOCALIZATION LEFT BREAST LUMPECTOMY WITH LEFT  AXILLARY SENTINEL LYMPH NODE BIOPSY;  Surgeon: Excell Seltzer, MD;  Location: [facility];  Service: General;  Laterality: Left;  . TUBAL LIGATION      There were no vitals filed for this visit.       Subjective Assessment - 05/24/16 1326    Subjective Has had back pain for "some time, but it really started to slow me down about 2-3 months ago. I keep having back pain...the store is no more than 2 minutes from my house. The left leg feels like a cramp. It locks up on me and feels like I can't move it.  Pain in lower back and LLE is limiting sitting tolerance, walking tolerance,    Pertinent History Name pronounced Muh-lay-shuh.  *BP on R arm only.  PMH significant for: breast cancer (in remission) s/p radiation and L mastectomy; anxiety, depression   Limitations  Sitting;Lifting;Standing;Walking;House hold activities   How long can you sit comfortably? 15 minutes   How long can you stand comfortably? 10 minutes   How long can you walk comfortably? < 2 minutes   Diagnostic tests X-ray of lumbar spine (03/26/16): DDD L1-2 with anterior osteophytes at T12, L1, and L2.    Patient Stated Goals "To fix this (back and left leg pain)... because this has got me out of work, and that's really depressing."   Currently in Pain? Yes   Pain Score 7    Pain Location Back   Pain Orientation Left   Pain Descriptors / Indicators Other (Comment)  "locked up"   Pain Type Chronic pain   Pain Radiating Towards into lateral aspect of L thigh   Pain Onset More than a month ago   Pain Frequency Constant   Aggravating Factors  lying on L side, walking   Pain Relieving Factors lying on back   Multiple Pain Sites No            [facility] PT Assessment - 05/24/16 0001      Assessment   Medical Diagnosis Degenerative disc disease of lumbar spine   Referring Provider Nolene Ebbs, MD   Onset Date/Surgical Date 01/27/16     Precautions   Precautions Other (comment)   Precaution Comments BP on R arm only     Restrictions   Weight  Bearing Restrictions No     Balance Screen   Has the patient fallen in the past 6 months No   Has the patient had a decrease in activity level because of a fear of falling?  No   Is the patient reluctant to leave their home because of a fear of falling?  No     Prior Function   Level of Independence Independent   Vocation Unemployed     Cognition   Overall Cognitive Status Within Functional Limits for tasks assessed     Observation/Other Assessments   Observations Concordant pain directly over L PSIS.     Special Tests    Special Tests Sacrolliac Tests;Leg LengthTest   Sacroiliac Tests  Sacral Thrust   Leg length test  other  Supine to/from long sit test suggests L ant. innonimate     Pelvic Dictraction   Findings Positive    Side  Left     Pelvic Compression   Findings Positive   Side Left     Sacral thrust    Findings Positive   Side Left                           PT Education - 05/24/16 1417    Education provided Yes   Education Details PT eval findings. Educated pt on SI joint dysfunction and provided HEP for muscle energy techniques and core strengthening. Logroll technique for supine <> sit to decrease pain. Explained limitations of insurance coverage. Provided information on pro bono [facility].   Person(s) Educated Patient   Methods Explanation;Demonstration;Verbal cues   Comprehension Verbalized understanding;Returned demonstration                Long Term Clinic Goals - 04/23/16 1706      CC Long Term Goal  #1   Title Pt will be independent in self manual lymphatic drainage for long term management of edema.   Status Achieved     CC Long Term Goal  #2   Title Pt will receive a trial of FlexiTouch compression pump for long term management of edema.    Status Achieved     CC Long Term Goal  #3   Title Pt will be independent in a home program for strengthening and ROM of left shoulder and UE   Status Achieved     CC Long Term Goal  #4   Title Pt will be able to independently verbalize lymphedema risk reduction policies   Status Achieved            Plan - 05/24/16 1424    Clinical Impression Statement Pt is a 45 y/o F referred to outpatient neuro PT to address functional impairments associated with degenerative disease of lumbar spine.  MH significant for: breast cancer (in remission) s/p radiation and L mastectomy; anxiety, depression. PT evaluation reveals: pain at L PSIS with LLE weightbearing, prolonged standing/walking, and transitional movements; (+) Sacral Compression Test, (+) SIJ Distraction/Compression Tests; and Supine to/from Long Sit Test findings suggest L anterior innonimate. Unable to rule out SI joint dysfunction.  Due to limitations  of insurance (1 evaluation per calendar year) and pt inability to pay for PT out of pocket, educated pt on the following: 1) muscle energy techniques to address pain; 2) importance of core muscle strengthening to stabilize L SI joint and control pain; 3) improved body mechanics with bed mobility; and 4) provided education on pro bono [facility]  to enable pt to seek further treatment. Pt verbalized understanding and was in full agreement.    PT Frequency One time visit   Consulted and Agree with Plan of Care Patient      Patient will benefit from skilled therapeutic intervention in order to improve the following deficits and impairments:  Improper body mechanics, Pain  Visit Diagnosis: Left-sided low back pain without sciatica - Plan: PT plan of care cert/re-cert  Pain In Left Leg - Plan: PT plan of care cert/re-cert     Problem List Patient Active Problem List   Diagnosis Date Noted  . Back pain 05/06/2016  . Asthma 02/06/2016  . Asthma exacerbation 02/06/2016  . Emesis   . Morbid obesity ([place])   . Genetic testing 09/26/2015  . Cigarette smoker 09/21/2015  . Severe obesity (BMI >= 40) ([place]) 09/21/2015  . Dyspnea 09/20/2015  . Breast cancer of upper-inner quadrant of left female breast ([place]) 09/08/2015   Billie Ruddy, PT, DPT [facility] [address] [place], [place], [address] Phone: [phone]   Fax:  [phone] 05/24/16, 2:31 PM  Name: Yolanda White MRN: [id number] Date of Birth: [birth year]

## 2016-05-24 NOTE — Patient Instructions (Signed)
   SIJ MET Correction Seated  Sit with knees bent  - Press RIGHT knee up into rested hand as if you were flexing your hip. - At the same time you are pulling the LEFT leg back as if you were bending your LEFT knee.  - Hold for 5-10 seconds, 4-6 repetitions to address lower back pain.    MET for SI joint  - Begin on your back with the knees and hips bent to 90 degrees. Use a dowel or broomstick to go through the legs.  - Have the dowel behind the left leg and in front of the right leg. Stabilize the dowel on ether side with the hands. - Press down with the left leg and up with the right and hold for 6 seconds. Slowly release back to the starting position. Repeat.   Perform 5 reps, __2-3__ times per day to address pain in lower back.   Bracing With Leg March (Hook-Lying)    With neutral spine, tighten pelvic floor and abdominals and hold. Alternating legs, lift foot silghtly and return to floor, as though marching. Repeat __10_ times. Do _2-3_ times a day. Progress by increasing the reps by 2-3 at a time, as tolerated.

## 2016-05-31 ENCOUNTER — Encounter: Payer: Self-pay | Admitting: Skilled Nursing Facility1

## 2016-05-31 ENCOUNTER — Encounter: Payer: Medicaid Other | Attending: Internal Medicine | Admitting: Skilled Nursing Facility1

## 2016-05-31 DIAGNOSIS — Z029 Encounter for administrative examinations, unspecified: Secondary | ICD-10-CM | POA: Diagnosis not present
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
## 2016-06-17 NOTE — Telephone Encounter (Signed)
error 

## 2016-06-19 ENCOUNTER — Encounter (HOSPITAL_COMMUNITY): Payer: Self-pay

## 2016-06-21 ENCOUNTER — Encounter: Payer: Self-pay | Admitting: Skilled Nursing Facility1

## 2016-06-21 ENCOUNTER — Other Ambulatory Visit: Payer: Self-pay | Admitting: Pediatrics

## 2016-06-21 DIAGNOSIS — J45909 Unspecified asthma, uncomplicated: Secondary | ICD-10-CM

## 2016-06-28 ENCOUNTER — Encounter (HOSPITAL_COMMUNITY): Payer: Self-pay | Admitting: Vascular Surgery

## 2016-06-28 ENCOUNTER — Emergency Department (HOSPITAL_COMMUNITY)
Admission: EM | Admit: 2016-06-28 | Discharge: 2016-06-28 | Disposition: A | Discharge status: Home / Self Care | Payer: Medicaid Other | Attending: Emergency Medicine | Admitting: Emergency Medicine

## 2016-06-28 DIAGNOSIS — H66001 Acute suppurative otitis media without spontaneous rupture of ear drum, right ear: Secondary | ICD-10-CM | POA: Insufficient documentation

## 2016-06-28 DIAGNOSIS — J029 Acute pharyngitis, unspecified: Secondary | ICD-10-CM | POA: Insufficient documentation

## 2016-06-28 DIAGNOSIS — Z853 Personal history of malignant neoplasm of breast: Secondary | ICD-10-CM | POA: Insufficient documentation

## 2016-06-28 DIAGNOSIS — R197 Diarrhea, unspecified: Secondary | ICD-10-CM | POA: Insufficient documentation

## 2016-06-28 DIAGNOSIS — R112 Nausea with vomiting, unspecified: Secondary | ICD-10-CM | POA: Diagnosis not present

## 2016-06-28 DIAGNOSIS — H9201 Otalgia, right ear: Secondary | ICD-10-CM | POA: Diagnosis present

## 2016-06-28 DIAGNOSIS — J45909 Unspecified asthma, uncomplicated: Secondary | ICD-10-CM | POA: Insufficient documentation

## 2016-06-28 DIAGNOSIS — F17210 Nicotine dependence, cigarettes, uncomplicated: Secondary | ICD-10-CM | POA: Diagnosis not present

## 2016-06-28 LAB — COMPREHENSIVE METABOLIC PANEL
ALT: 19 U/L (ref 14–54)
ANION GAP: 7 (ref 5–15)
AST: 23 U/L (ref 15–41)
Albumin: 3.8 g/dL (ref 3.5–5.0)
Alkaline Phosphatase: 118 U/L (ref 38–126)
BILIRUBIN TOTAL: 0.9 mg/dL (ref 0.3–1.2)
BUN: <5 mg/dL — ABNORMAL LOW (ref 6–20)
CHLORIDE: 108 mmol/L (ref 101–111)
CO2: 22 mmol/L (ref 22–32)
Calcium: 9.1 mg/dL (ref 8.9–10.3)
Creatinine, Ser: 1.11 mg/dL — ABNORMAL HIGH (ref 0.44–1.00)
GFR calc non Af Amer: 60 mL/min — ABNORMAL LOW (ref >60)
Glucose, Bld: 94 mg/dL (ref 65–99)
POTASSIUM: 3.9 mmol/L (ref 3.5–5.1)
Sodium: 137 mmol/L (ref 135–145)
TOTAL PROTEIN: 7.3 g/dL (ref 6.5–8.1)

## 2016-06-28 LAB — CBC
HEMATOCRIT: 41.9 % (ref 36.0–46.0)
HEMOGLOBIN: 14.1 g/dL (ref 12.0–15.0)
MCH: 28.1 pg (ref 26.0–34.0)
MCHC: 33.7 g/dL (ref 30.0–36.0)
MCV: 83.6 fL (ref 78.0–100.0)
Platelets: 333 10*3/uL (ref 150–400)
RBC: 5.01 MIL/uL (ref 3.87–5.11)
RDW: 12.8 % (ref 11.5–15.5)
WBC: 9.4 10*3/uL (ref 4.0–10.5)

## 2016-06-28 LAB — RAPID STREP SCREEN (MED CTR MEBANE ONLY): Streptococcus, Group A Screen (Direct): NEGATIVE

## 2016-06-28 MED ORDER — ONDANSETRON HCL 4 MG PO TABS: 4.0000 mg | ORAL_TABLET | Freq: Four times a day (QID) | ORAL | 0 refills | Status: DC | PRN

## 2016-06-28 MED ORDER — ONDANSETRON 4 MG PO TBDP
4.0000 mg | ORAL_TABLET | Freq: Once | ORAL | Status: AC
  Administered 2016-06-28: 4 mg via ORAL
  Filled 2016-06-28: qty 1

## 2016-06-28 MED ORDER — AMOXICILLIN 500 MG PO CAPS: 1000.0000 mg | ORAL_CAPSULE | Freq: Two times a day (BID) | ORAL | 0 refills | Status: AC

## 2016-06-28 MED ORDER — IBUPROFEN 800 MG PO TABS
800.0000 mg | ORAL_TABLET | Freq: Once | ORAL | Status: AC
  Administered 2016-06-28: 800 mg via ORAL
  Filled 2016-06-28: qty 1

## 2016-06-28 NOTE — ED Provider Notes (Signed)
[facility] DEPT Provider Note   CSN: [id number] Arrival date & time: 06/28/16  1438    History   Chief Complaint Chief Complaint  Patient presents with  . Otalgia  . Sore Throat  . Emesis    HPI Yolanda White is a 45 y.o. female.  The history is provided by the patient and a relative.  Otalgia  This is a new problem. The current episode started 6 to 12 hours ago. There is pain in the right ear. The problem occurs constantly. The problem has been gradually worsening. There has been no fever. The pain is severe. Associated symptoms include rhinorrhea, sore throat, diarrhea, vomiting and cough. Pertinent negatives include no ear discharge, no headaches, no hearing loss, no abdominal pain, no neck pain and no rash.    Past Medical History:  Diagnosis Date  . Anxiety   . Asthma   . Breast cancer ([place])   . Breast cancer of upper-inner quadrant of left female breast ([place]) 09/08/2015  . Depression   . Seasonal allergies     Patient Active Problem List   Diagnosis Date Noted  . Back pain 05/06/2016  . Asthma 02/06/2016  . Asthma exacerbation 02/06/2016  . Emesis   . Morbid obesity ([place])   . Genetic testing 09/26/2015  . Cigarette smoker 09/21/2015  . Severe obesity (BMI >= 40) ([place]) 09/21/2015  . Dyspnea 09/20/2015  . Breast cancer of upper-inner quadrant of left female breast ([place]) 09/08/2015    Past Surgical History:  Procedure Laterality Date  . ABDOMINAL HYSTERECTOMY    . RADIOACTIVE SEED GUIDED MASTECTOMY WITH AXILLARY SENTINEL LYMPH NODE BIOPSY Left 10/11/2015   Procedure: RADIOACTIVE SEED LOCALIZATION LEFT BREAST LUMPECTOMY WITH LEFT  AXILLARY SENTINEL LYMPH NODE BIOPSY;  Surgeon: Excell Seltzer, MD;  Location: [facility];  Service: General;  Laterality: Left;  . TUBAL LIGATION      OB History    Gravida Para Term Preterm AB Living   4 3 3   1 3    SAB TAB Ectopic Multiple Live Births                   Home Medications    Prior  to Admission medications   Medication Sig Start Date End Date Taking? Authorizing Provider  albuterol (VENTOLIN HFA) 108 (90 BASE) MCG/ACT inhaler Inhale 2 puffs into the lungs every 6 (six) hours as needed for wheezing or shortness of breath. Reported on 11/16/2015   Yes Historical Provider, MD  bismuth subsalicylate (PEPTO BISMOL) 262 MG chewable tablet Chew 524 mg by mouth once. Reported on 05/16/2016   Yes Historical Provider, MD  cyclobenzaprine (FLEXERIL) 10 MG tablet Take 10 mg by mouth 3 (three) times daily as needed for muscle spasms.   Yes Historical Provider, MD  ibuprofen (ADVIL,MOTRIN) 800 MG tablet Take 800 mg by mouth 3 (three) times daily as needed (pain).  04/17/16  Yes Historical Provider, MD  traZODone (DESYREL) 50 MG tablet Take 50 mg by mouth at bedtime. Reported on 05/16/2016   Yes Historical Provider, MD  amoxicillin (AMOXIL) 500 MG capsule Take 2 capsules (1,000 mg total) by mouth 2 (two) times daily. 06/28/16 07/08/16  Ivin Booty, MD  dicyclomine (BENTYL) 20 MG tablet Take 1 tablet (20 mg total) by mouth 2 (two) times daily as needed for spasms (abdominal cramping). Patient not taking: Reported on 05/16/2016 05/05/16   Sharlett Iles, MD  docusate sodium (COLACE) 100 MG capsule Take 1 capsule (100 mg total) by  mouth 2 (two) times daily. Patient not taking: Reported on 04/01/2016 02/09/16   Donne Hazel, MD  ketorolac (TORADOL) 10 MG tablet Take 1 tablet (10 mg total) by mouth every 6 (six) hours as needed. Patient not taking: Reported on 03/13/2016 02/09/16   Donne Hazel, MD  lactulose (CEPHULAC) 20 g packet Take 1 packet (20 g total) by mouth 3 (three) times daily. Patient not taking: Reported on 05/24/2016 02/14/16   Truitt Merle, MD  ondansetron (ZOFRAN ODT) 4 MG disintegrating tablet Take 1 tablet (4 mg total) by mouth every 8 (eight) hours as needed for nausea or vomiting. Patient not taking: Reported on 05/16/2016 05/05/16   Sharlett Iles, MD  ondansetron (ZOFRAN) 4 MG  tablet Take 1 tablet (4 mg total) by mouth every 6 (six) hours as needed for nausea or vomiting. 06/28/16   Ivin Booty, MD  oxyCODONE-acetaminophen (PERCOCET/ROXICET) 5-325 MG tablet Take 1-2 tablets by mouth every 6 (six) hours as needed for severe pain. Patient not taking: Reported on 06/28/2016 05/16/16   Hayden Pedro, PA-C  polyethylene glycol [facility] / Floria Raveling) packet Take 17 g by mouth daily. Patient not taking: Reported on 05/16/2016 02/09/16   Donne Hazel, MD  tamoxifen (NOLVADEX) 20 MG tablet Take 1 tablet (20 mg total) by mouth daily. Patient not taking: Reported on 06/28/2016 05/06/16   Truitt Merle, MD  venlafaxine XR (EFFEXOR-XR) 75 MG 24 hr capsule Take 1 capsule (75 mg total) by mouth daily with breakfast. Patient not taking: Reported on 06/28/2016 05/06/16   Truitt Merle, MD    Family History Family History  Problem Relation Age of Onset  . Diabetes Mother   . Diabetes Father   . Lung cancer Father 76    smoker  . Cancer Paternal Aunt     cervical  . Diabetes Brother   . Heart attack Paternal Grandmother     in her 60s  . Diabetes Brother     maternal half brother  . Asthma Brother   . Asthma Maternal Aunt     Social History Social History  Substance Use Topics  . Smoking status: Current Every Day Smoker    Packs/day: 0.50    Years: 34.00    Types: Cigarettes  . Smokeless tobacco: Never Used  . Alcohol use No     Allergies   Review of patient's allergies indicates no known allergies.   Review of Systems Review of Systems  Constitutional: Negative for fever.  HENT: Positive for ear pain, rhinorrhea and sore throat. Negative for ear discharge and hearing loss.   Eyes: Negative for visual disturbance.  Respiratory: Positive for cough. Negative for shortness of breath.   Cardiovascular: Negative for chest pain.  Gastrointestinal: Positive for diarrhea and vomiting. Negative for abdominal pain.  Genitourinary: Negative for dysuria.  Musculoskeletal:  Negative for neck pain and neck stiffness.  Skin: Negative for color change and rash.  Allergic/Immunologic: Negative for immunocompromised state.  Neurological: Negative for headaches.  All other systems reviewed and are negative.    Physical Exam Updated Vital Signs BP 124/90   Pulse 79   Temp 98.1 F (36.7 C) (Oral)   Resp 18   SpO2 97%   Physical Exam  Constitutional: She appears well-developed and well-nourished. No distress.  HENT:  Head: Normocephalic and atraumatic.  Left TM normal. R TM with suppurative ME effusion with bulge and erythema of canal. Rhinorrhea. Diffuse very mild posterior OP erythema. No exudates, no tonsillar enlargement. No TTP Daralene Milch  over mastoids   Eyes: Conjunctivae are normal.  Neck: Neck supple.  No meningismus   Cardiovascular: Normal rate and regular rhythm.   No murmur heard. Pulmonary/Chest: Effort normal and breath sounds normal. No respiratory distress.  Abdominal: Soft. There is no tenderness.  Musculoskeletal: She exhibits no edema.  Neurological: She is alert.  Skin: Skin is warm and dry.  Psychiatric: She has a normal mood and affect.  Nursing note and vitals reviewed.    ED Treatments / Results  Labs (all labs ordered are listed, but only abnormal results are displayed) Labs Reviewed  COMPREHENSIVE METABOLIC PANEL - Abnormal; Notable for the following:       Result Value   BUN <5 (*)    Creatinine, Ser 1.11 (*)    GFR calc non Af Amer 60 (*)    All other components within normal limits  RAPID STREP SCREEN (NOT AT [facility])  CULTURE, GROUP A STREP [facility])  CBC    EKG  EKG Interpretation None       Radiology No results found.  Procedures Procedures (including critical care time)  Medications Ordered in ED Medications  ibuprofen (ADVIL,MOTRIN) tablet 800 mg (800 mg Oral Given 06/28/16 1547)  ondansetron (ZOFRAN-ODT) disintegrating tablet 4 mg (4 mg Oral Given 06/28/16 1548)     Initial Impression / Assessment and  Plan / ED Course  I have reviewed the triage vital signs and the nursing notes.  Pertinent labs & imaging results that were available during my care of the patient were reviewed by me and considered in my medical decision making (see chart for details).  Clinical Course    45 yo F with hx breast ca s/p treatment (not currently on any chemo), anxiety, depression, asthma presenting with otalgia, sore throat, cough, rhinorrhea, n/v/d x1 day. AF, stable vitals. No leukocytosis. Strep screen negative. Presentation c/w likely viral syndrome w acute otitis media of right ear. No evidence of mastoiditis, meningitis. Tolerating PO. Improved with ibuprofen, zofran. Dc with amox, zofran, NSAIDs and PCP f/u as needed if not improved.   Case discussed with Dr. Laneta Simmers who oversaw management of this patient.   Final Clinical Impressions(s) / ED Diagnoses   Final diagnoses:  Nausea vomiting and diarrhea  Acute suppurative otitis media of right ear without spontaneous rupture of tympanic membrane, recurrence not specified    New Prescriptions Discharge Medication List as of 06/28/2016  3:22 PM    START taking these medications   Details  amoxicillin (AMOXIL) 500 MG capsule Take 2 capsules (1,000 mg total) by mouth 2 (two) times daily., Starting Fri 06/28/2016, Until Mon 07/08/2016, Print    ondansetron (ZOFRAN) 4 MG tablet Take 1 tablet (4 mg total) by mouth every 6 (six) hours as needed for nausea or vomiting., Starting Fri 06/28/2016, Print         Ivin Booty, MD 06/30/16 1225    Leo Grosser, MD 07/01/16 1556

## 2016-06-28 NOTE — ED Triage Notes (Signed)
Pt reports to the ED for eval of right ear pain, sore throat, and N/V that started today. She also reports some diarrhea. Denies any blood in her stool or emesis. Pt also has a productive yellow cough. Also reports chills and body aches, unknown fevers. Denies any sick contacts.

## 2016-06-30 LAB — CULTURE, GROUP A STREP (THRC)

## 2016-07-04 ENCOUNTER — Ambulatory Visit: Payer: Disability Insurance | Attending: Pediatrics

## 2016-07-04 DIAGNOSIS — J449 Chronic obstructive pulmonary disease, unspecified: Secondary | ICD-10-CM | POA: Diagnosis not present

## 2016-07-04 DIAGNOSIS — J45909 Unspecified asthma, uncomplicated: Secondary | ICD-10-CM | POA: Diagnosis not present

## 2016-07-08 IMAGING — CR DG ABD PORTABLE 1V
2 series · 2 of 2 positions shown · non-contrast
Comparison: None.

CLINICAL DATA: Nausea vomiting and right lower abdominal pain,
onset yesterday

EXAM:
PORTABLE ABDOMEN - 1 VIEW

[AP (1 of 2)]
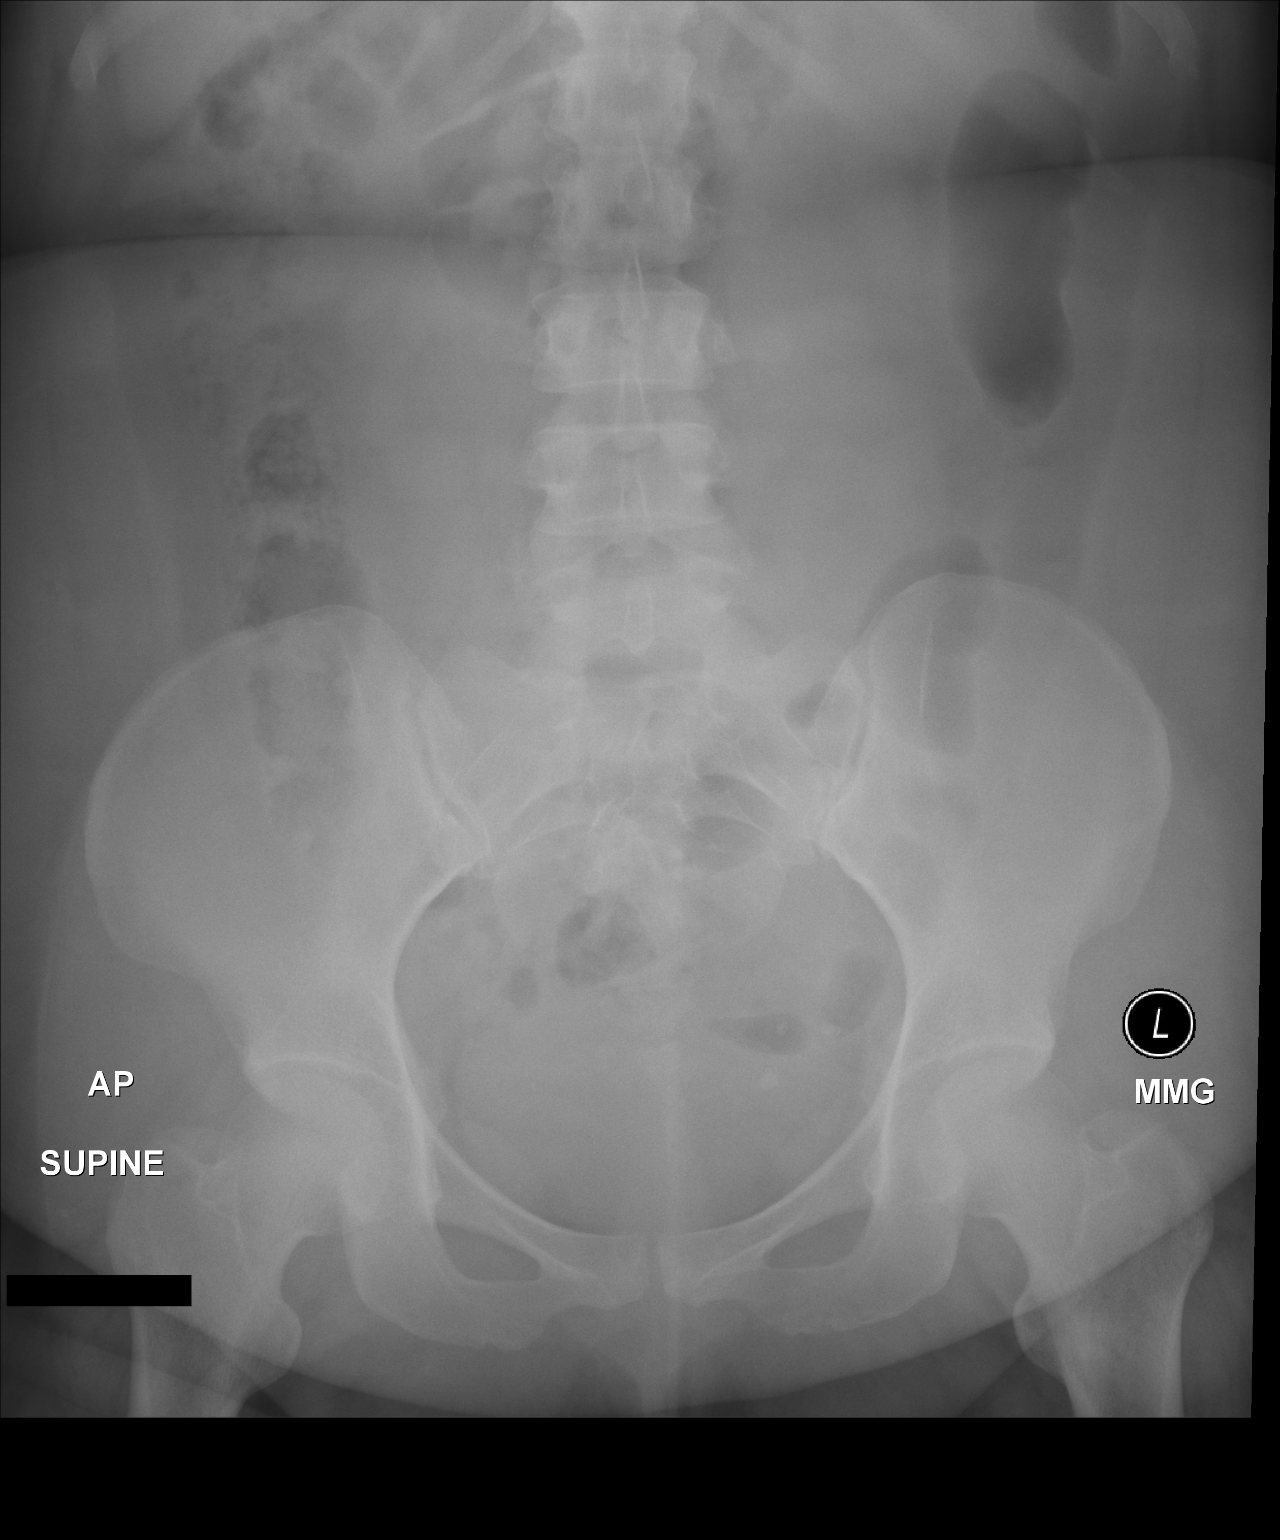

[AP (2 of 2)]
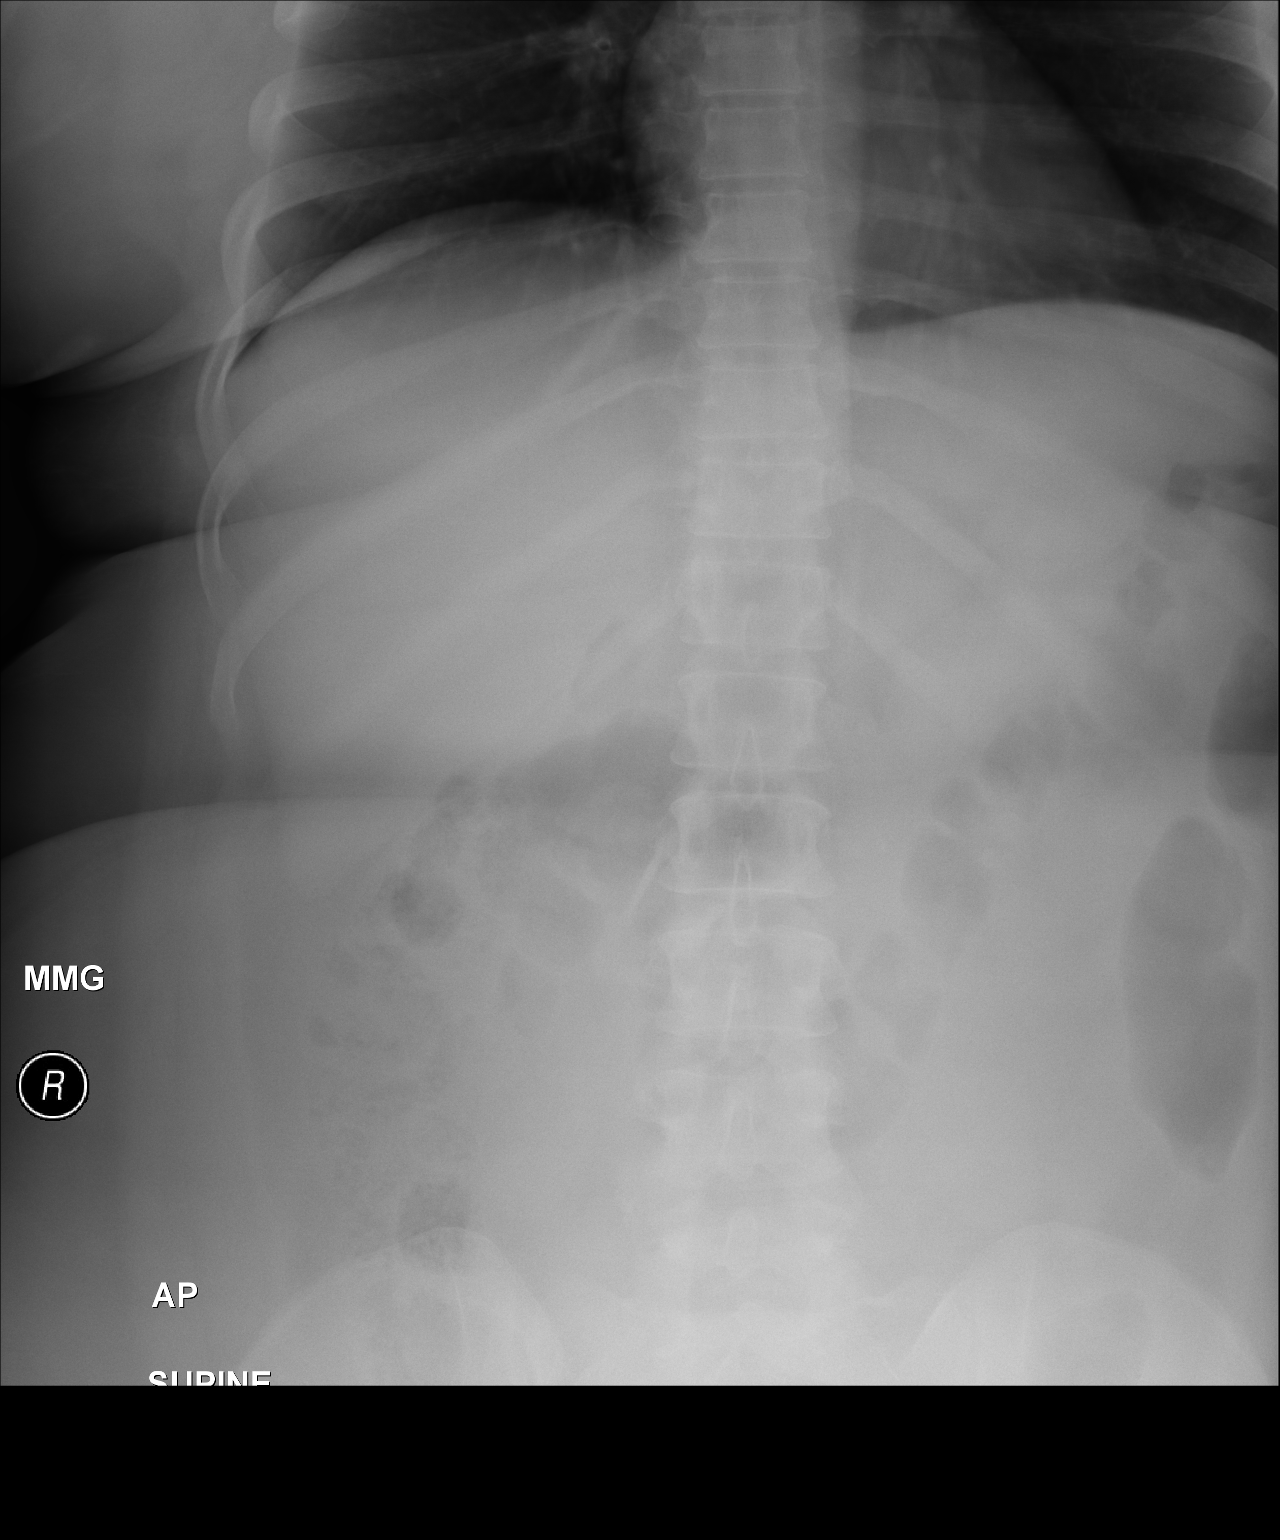

[2 of 2 positions shown; findings below may reference images not displayed]

FINDINGS: Three portable views of the abdomen demonstrate an unremarkable gas
pattern. No evidence of obstruction or perforation.
IMPRESSION: Negative for obstruction or perforation.

## 2016-07-08 IMAGING — CT CT RENAL STONE PROTOCOL
2 of 5 series · 16 of 46 positions shown, 18 images · non-contrast
Comparison: Abdominal radiographs October 06, 2014 and CT abdomen
and pelvis December 24, 2004

CLINICAL DATA: Nausea and vomiting since 3033 hours. Received
radiation treatment for breast cancer today. Dysuria and back pain.

EXAM:
CT ABDOMEN AND PELVIS WITHOUT CONTRAST
TECHNIQUE: Multidetector CT imaging of the abdomen and pelvis was performed
following the standard protocol without IV contrast.

[Series 4: renal stone 3.0 cor · coronal · 0.73mm/px · 3 of 104 slices shown]
[im 35/104  soft-tissue]
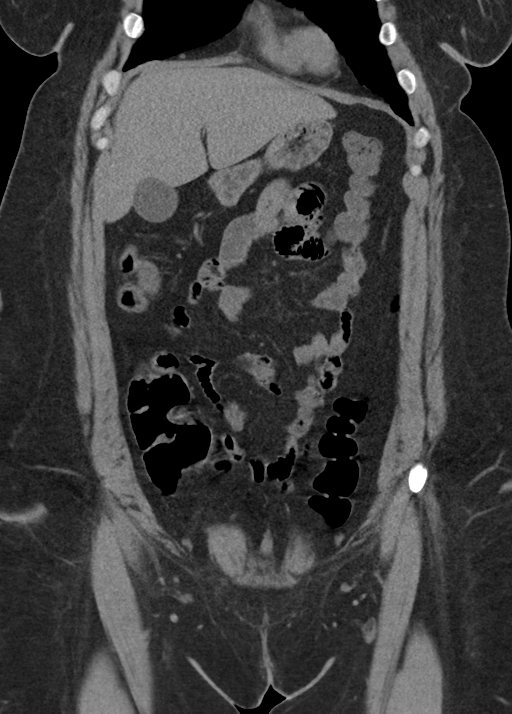
[im 46/104  soft-tissue]
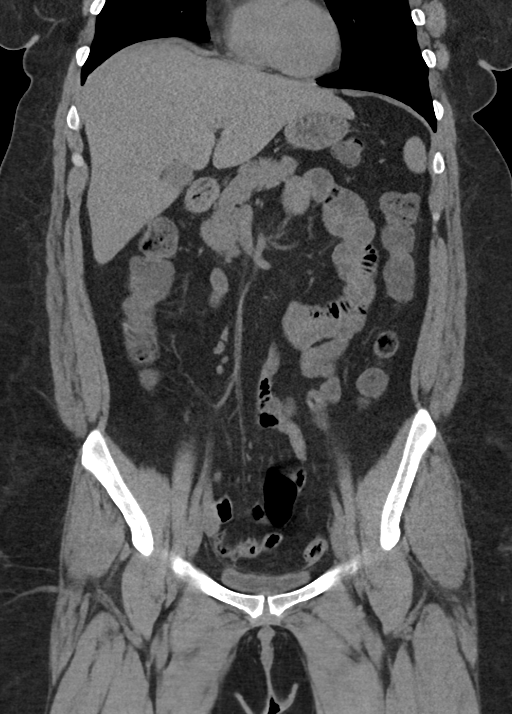
[im 58/104  soft-tissue]
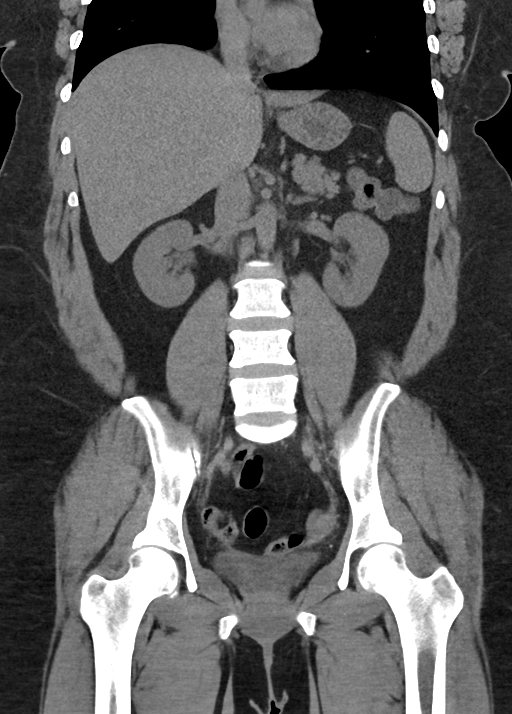

[Series 7: renal stone 1mm · axial · 0.84mm/px · z∈[-565,-89]mm · 13 of 520 slices shown, 15 images]
[im 22/520  soft-tissue]
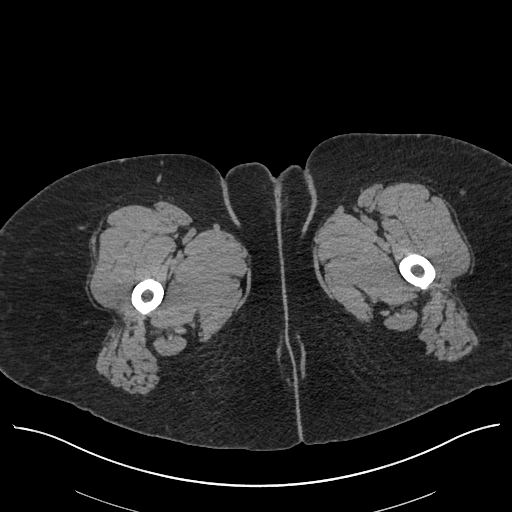
[im 22/520  bone]
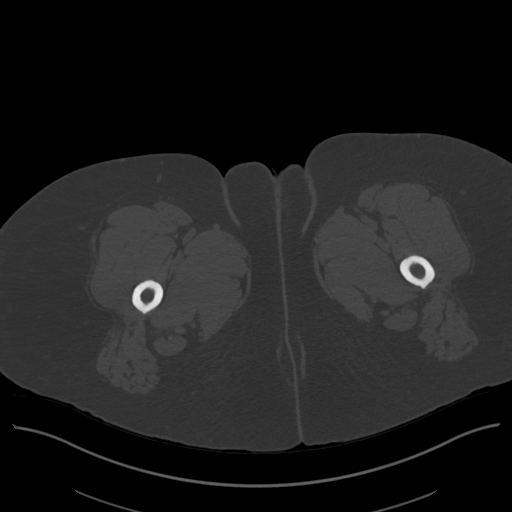
[im 65/520  soft-tissue]
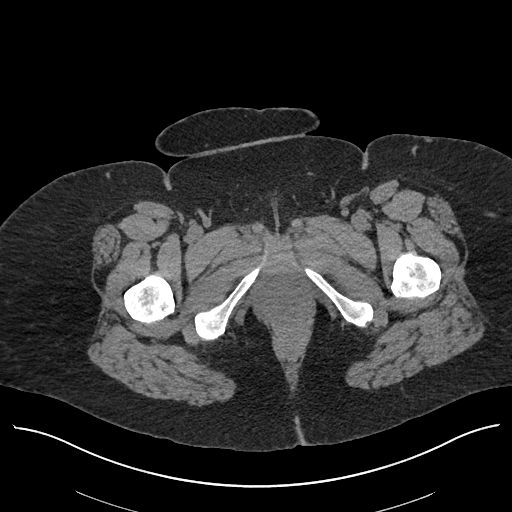
[im 109/520  soft-tissue]
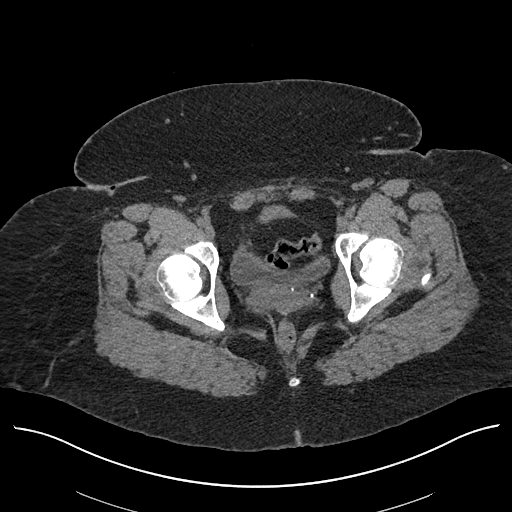
[im 152/520  soft-tissue]
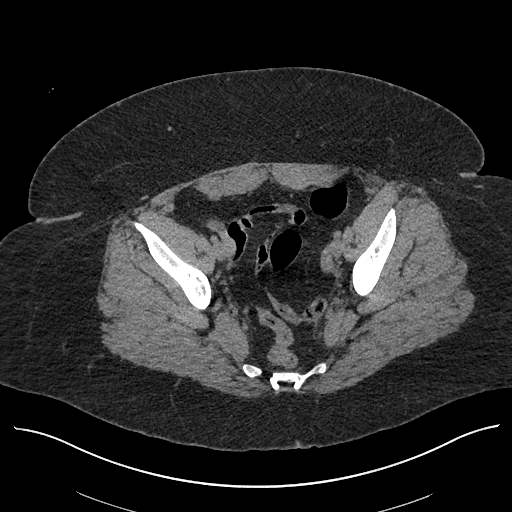
[im 174/520  soft-tissue]
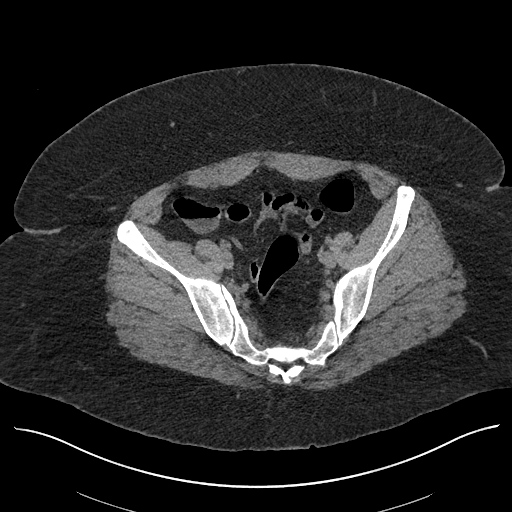
[im 217/520  soft-tissue]
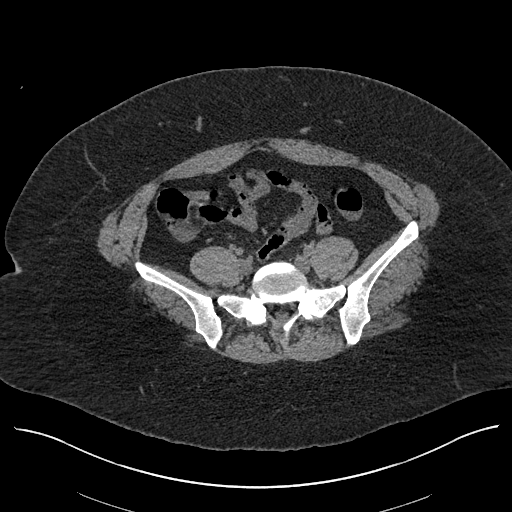
[im 260/520  soft-tissue]
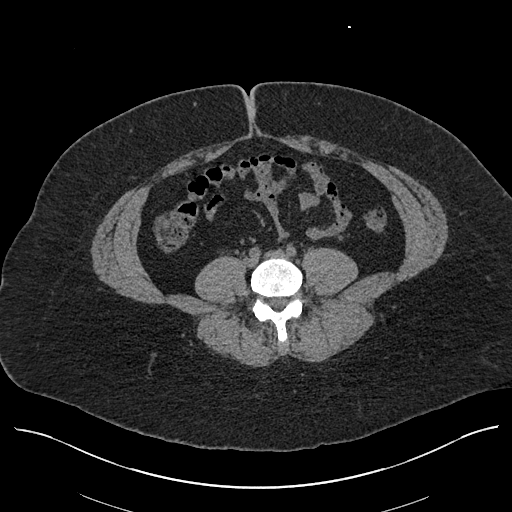
[im 303/520  soft-tissue]
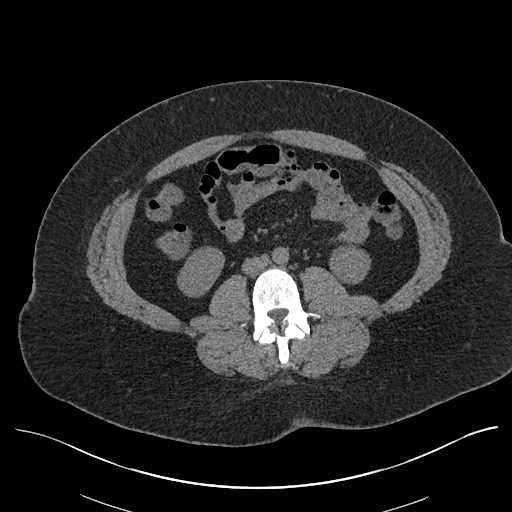
[im 347/520  soft-tissue]
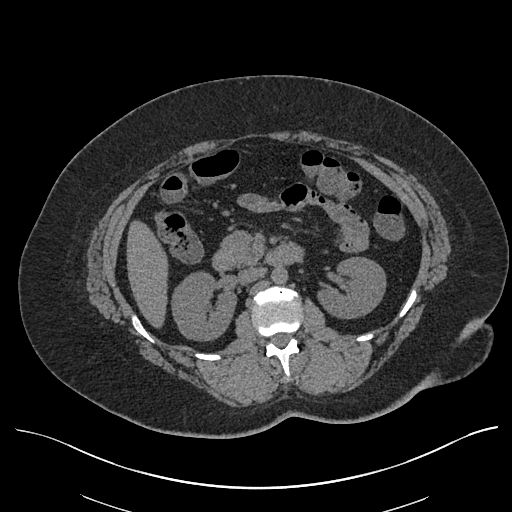
[im 347/520  bone]
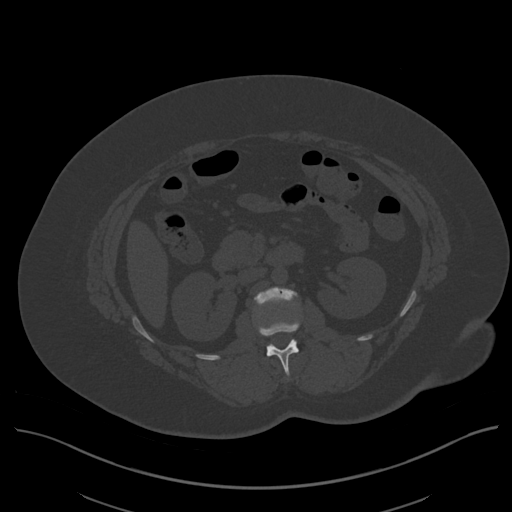
[im 368/520  soft-tissue]
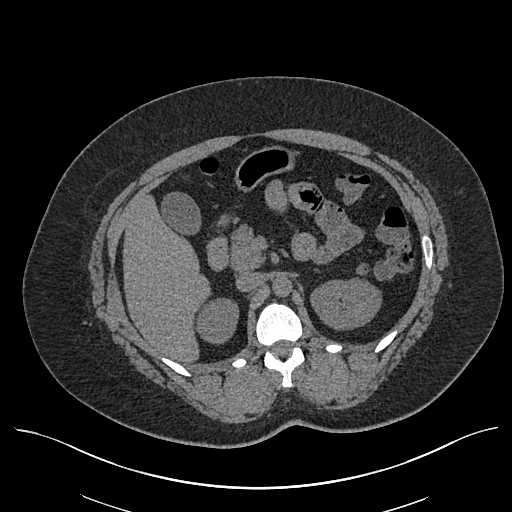
[im 411/520  soft-tissue]
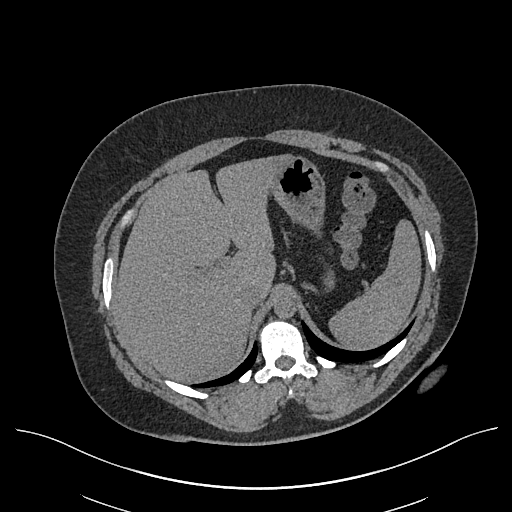
[im 455/520  soft-tissue]
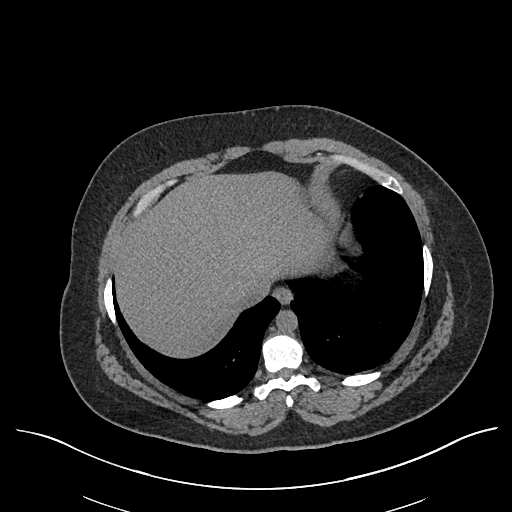
[im 498/520  soft-tissue]
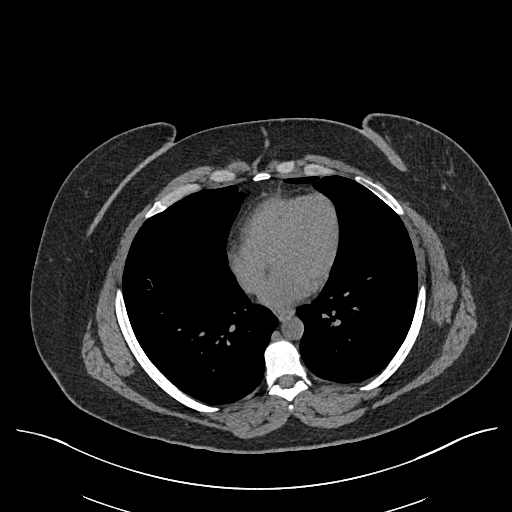

[16 of 46 positions shown; findings below may reference images not displayed]

FINDINGS: LUNG BASES: Subcentimeter pleural-based 4 mm nodule is likely
benign, axial [DATE]. The visualized heart and pericardium are
unremarkable.

KIDNEYS/BLADDER: Kidneys are orthotopic, demonstrating normal size
and morphology. No nephrolithiasis, hydronephrosis; limited
assessment for renal masses on this nonenhanced examination. The
unopacified ureters are normal in course and caliber. Urinary
bladder is partially distended and unremarkable.

SOLID ORGANS: The liver, spleen, gallbladder, pancreas and adrenal
glands are unremarkable for this non-contrast examination.

GASTROINTESTINAL TRACT: The stomach, small and large bowel are
normal in course and caliber without inflammatory changes, the
sensitivity may be decreased by lack of enteric contrast. Normal
appendix.

PERITONEUM/RETROPERITONEUM: Aortoiliac vessels are normal in course
and caliber. No lymphadenopathy by CT size criteria. Status post
hysterectomy. No intraperitoneal free fluid nor free air.

SOFT TISSUES/ OSSEOUS STRUCTURES: Nonsuspicious. Focal sclerosis at
LEFT sacroiliac joint compatible with osteoarthrosis, progressed
from prior CT.
IMPRESSION: No urolithiasis or obstructive uropathy. No acute intra-abdominal/
pelvic process.

## 2016-07-09 ENCOUNTER — Encounter: Payer: Medicaid Other | Admitting: Hematology

## 2016-07-09 VITALS — BP 105/48 | HR 90 | Temp 98.4°F | Resp 18 | Ht 62.0 in | Wt 246.0 lb

## 2016-07-09 DIAGNOSIS — F329 Major depressive disorder, single episode, unspecified: Secondary | ICD-10-CM | POA: Insufficient documentation

## 2016-07-09 DIAGNOSIS — C50212 Malignant neoplasm of upper-inner quadrant of left female breast: Secondary | ICD-10-CM

## 2016-07-09 DIAGNOSIS — F32A Depression, unspecified: Secondary | ICD-10-CM | POA: Insufficient documentation

## 2016-07-09 NOTE — Progress Notes (Signed)
Patient left without being seen due to her daughter's car accident. Will reschedule.   Truitt Merle  07/09/2016   This encounter was created in error - please disregard.

## 2016-07-10 ENCOUNTER — Encounter: Payer: Self-pay | Admitting: Hematology

## 2016-07-10 ENCOUNTER — Telehealth: Payer: Self-pay | Admitting: Hematology

## 2016-07-10 ENCOUNTER — Ambulatory Visit (HOSPITAL_BASED_OUTPATIENT_CLINIC_OR_DEPARTMENT_OTHER): Payer: Medicaid Other | Admitting: Hematology

## 2016-07-10 VITALS — BP 138/56 | HR 95 | Temp 98.2°F | Resp 18 | Ht 62.0 in | Wt 244.2 lb

## 2016-07-10 DIAGNOSIS — N644 Mastodynia: Secondary | ICD-10-CM

## 2016-07-10 DIAGNOSIS — N951 Menopausal and female climacteric states: Secondary | ICD-10-CM

## 2016-07-10 DIAGNOSIS — M545 Low back pain: Secondary | ICD-10-CM

## 2016-07-10 DIAGNOSIS — F419 Anxiety disorder, unspecified: Secondary | ICD-10-CM | POA: Diagnosis not present

## 2016-07-10 DIAGNOSIS — C50212 Malignant neoplasm of upper-inner quadrant of left female breast: Secondary | ICD-10-CM | POA: Diagnosis not present

## 2016-07-10 DIAGNOSIS — G8929 Other chronic pain: Secondary | ICD-10-CM | POA: Diagnosis not present

## 2016-07-10 DIAGNOSIS — F329 Major depressive disorder, single episode, unspecified: Secondary | ICD-10-CM | POA: Diagnosis not present

## 2016-07-10 DIAGNOSIS — E669 Obesity, unspecified: Secondary | ICD-10-CM | POA: Diagnosis not present

## 2016-07-10 DIAGNOSIS — F32A Depression, unspecified: Secondary | ICD-10-CM

## 2016-07-10 MED ORDER — GABAPENTIN 100 MG PO CAPS: 100.0000 mg | ORAL_CAPSULE | Freq: Every day | ORAL | 1 refills | Status: DC

## 2016-07-10 NOTE — Telephone Encounter (Signed)
Added f/u today at 3 pm. Per desk patient will come today - disregard previous message to r/s in a few days.
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
## 2016-07-10 NOTE — Progress Notes (Signed)
[place]  [phone] [phone]  Clinic follow up Note   Patient Care Team: Nolene Ebbs, MD as PCP - General (Internal Medicine) Excell Seltzer, MD as Consulting Physician (General Surgery) Truitt Merle, MD as Consulting Physician (Hematology) Thea Silversmith, MD as Consulting Physician (Radiation Oncology) 07/10/2016  CHIEF COMPLAINTS:  Follow up left breast cancer.  Oncology History   Breast cancer of upper-inner quadrant of left female breast [facility])   Staging form: Breast, AJCC 7th Edition     Clinical: Stage IA (T1c, N0, M0) - Signed by Truitt Merle, MD on 09/13/2015     Pathologic: No stage assigned - Unsigned       Breast cancer of upper-inner quadrant of left female breast ([place])   08/29/2015 Mammogram    Diagnostic mammo and US showed a 1.2 X 0.9 X 0.6cm mass in left breast 11:00 position       09/01/2015 Initial Biopsy    left breast biopsy showed invasive ductal carcinoma, G1      09/01/2015 Receptors her2    ER 90%+, PR 90%+, HER2-, Ki67 25%      09/01/2015 Clinical Stage    Stage IA: T1c N0      09/13/2015 Procedure    MyRisk panel: no del mut at [employer], ATM, BARD1, BMPR1A, BRCA1, BRCA2, BRIP1, CHD1, CDK4, CDKN2A, CHEK2, EPCAM (large rearrangement only), MLH1, MSH2, MSH6, MUTYH, NBN, PALB2, PMS2, PTEN, RAD51C, RAD51D, SMAD4, STK11, and TP53.       10/11/2015 Surgery    left breast lumpectomy and SLN biopsy      10/11/2015 Pathology Results    invasive ductal carcinoma, 3cm, LVI(-), margins negative, grade 1, 3 nodes (-)      10/11/2015 Oncotype testing    RS 17 (low risk), which predicts 10 year risk of distant recurrence with tamoxifen alone 11%      10/11/2015 Pathologic Stage    Stage IIA: T2 N0      12/18/2015 - 02/15/2016 Radiation Therapy    Adjuvant breast radiation: Left breast/ 45 Gy at 1.8 Gy per fraction x 25 fractions.  2. Left breast boost/ 16 Gy at 2 Gy per fraction x 8 fractions      02/2016 -   Anti-estrogen oral therapy    Tamoxifen 20 mg daily. Planned duration of therapy 10 years.      04/25/2016 Survivorship    SCP mailed to patient after missed appointment and request per pt      HISTORY OF PRESENTING ILLNESS:  Yolanda White 45 y.o. female is here because of her newly diagnosed left breast cancer. She is accompanied by her 2 daughters and one sister to our multidisciplinary breast clinic today.  She has not seen a primary care physician for many years. She presented with intermittent left breast pain for one month, and she was seen in the emergency room on 08/08/2015. She was referred to breast imaging center and had a mammogram done on 08/29/2015. Which showed a 1.2 cm mass in the left breast 11:00 position. She underwent core needle biopsy on 09/01/2015, which showed invasive ductal carcinoma, ER/PR stripe positive, HER-2 negative.  She has had lot of social and financial stress in the past year. She lives in a hotel now, her daughters live with her sister. She reports right-sided low back pain for the past few months, positional, she denies injury prior to that. She is also quite depressed and very anxious since the cancer diagnosis, she denies any significant dyspnea, GI  symptoms or other new symptoms.  CURRENT THERAPY: Tamoxifen 28m dialy started in 02/2016  INTERIM HISTORY: [name] for follow-up. She Has been on tamoxifen for the past 5-6 weeks, reports moderate to severe hot flash, and most weaned. She reports significant is the stress at home, she has to take of 4 little grandchildren at home without much breaks, and she feels quite depressed sometime. She also reports intermittent left breast pain, probably from the seroma. No significant joint discomfort. She developed significant nausea and vomiting yesterday morning when she woke up, and reports stream of blood in the vomitus. She presented to emergency room yesterday, and was discharged home. Her symptom  slightly improved today, although she has not eaten much since yesterday. No fever or chills, no diarrhea. She denies significant history of GI bleeding in the past.   MEDICAL HISTORY:  Past Medical History:  Diagnosis Date  . Anxiety   . Asthma   . Breast cancer ([place]   . Breast cancer of upper-inner quadrant of [place] breast [place] 09/08[place]  . Depression   . Seasonal allergies     SURGICAL HISTORY: Past Surgical History:  Procedure Laterality Date  . ABDOMINAL HYSTERECTOMY    . RADIOACTIVE SEED GUIDED MASTECTOMY WITH AXILLARY SENTINEL LYMPH NODE BIOPSY Left 10/11/2015   Procedure: RADIOACTIVE SEED LOCALIZATION LEFT BREAST LUMPECTOMY WITH LEFT  AXILLARY SENTINEL LYMPH NODE BIOPSY;  Surgeon: BExcell Seltzer MD;  [place] [facility]  Service: General;  Laterality: Left;  . TUBAL LIGATION     GYN HISTORY  Menarchal: 12 LMP: Hysterectomy in 2010  Contraceptive: she was on for one year , stopped 23 years ago  HRT: n/a G4  P3: she was 89with her first child, (+) breast feeding    SOCIAL HISTORY: Social History   Social History  . Marital Status: Single    Spouse Name: N/A  . Number of Children: 3 daughters age 44-25 . Years of Education: N/A   Occupational History  . Not on file.   Social History Main Topics  . Smoking status: Current Every Day Smoker -- 0.30 packs/day    Types: Cigarettes  . Smokeless tobacco: Not on file  . Alcohol Use: No  . Drug Use: No  . Sexual Activity: Yes    Birth Control/ Protection: Surgical   Other Topics Concern  . Not on file   Social History Narrative    FAMILY HISTORY: Family History  Problem Relation Age of Onset  . Diabetes Mother   . Diabetes Father   . Lung cancer Father 654   smoker  . Cancer Paternal Aunt     cervical  . Diabetes Brother   . Heart attack Paternal Grandmother     in her 846s . Diabetes Brother     maternal half brother  . Asthma Brother   . Asthma Maternal Aunt      ALLERGIES:  has No Known Allergies.  MEDICATIONS:  Current Outpatient Prescriptions  Medication Sig Dispense Refill  . albuterol (VENTOLIN HFA) 108 (90 BASE) MCG/ACT inhaler Inhale 2 puffs into the lungs every 6 (six) hours as needed for wheezing or shortness of breath. Reported on 11/16/2015    . cyclobenzaprine (FLEXERIL) 10 MG tablet Take 10 mg by mouth 3 (three) times daily as needed for muscle spasms.    .Marland Kitchendicyclomine (BENTYL) 20 MG tablet Take 1 tablet (20 mg total) by mouth 2 (two) times daily as needed for spasms (abdominal cramping). 10  tablet 0  . ibuprofen (ADVIL,MOTRIN) 800 MG tablet Take 800 mg by mouth 3 (three) times daily as needed (pain).   5  . ondansetron (ZOFRAN ODT) 4 MG disintegrating tablet Take 1 tablet (4 mg total) by mouth every 8 (eight) hours as needed for nausea or vomiting. 8 tablet 0  . ondansetron (ZOFRAN) 4 MG tablet Take 1 tablet (4 mg total) by mouth every 6 (six) hours as needed for nausea or vomiting. 10 tablet 0  . tamoxifen (NOLVADEX) 20 MG tablet Take 1 tablet (20 mg total) by mouth daily. 30 tablet 3  . venlafaxine XR (EFFEXOR-XR) 75 MG 24 hr capsule Take 1 capsule (75 mg total) by mouth daily with breakfast. 30 capsule 1   No current facility-administered medications for this visit.     REVIEW OF SYSTEMS:   Constitutional: Denies fevers, chills or abnormal night sweats Eyes: Denies blurriness of vision, double vision or watery eyes Ears, nose, mouth, throat, and face: Denies mucositis or sore throat Respiratory: Denies cough, dyspnea or wheezes Cardiovascular: Denies palpitation, chest discomfort or lower extremity swelling Gastrointestinal:  Denies nausea, heartburn or change in bowel habits Skin: Denies abnormal skin rashes Lymphatics: Denies new lymphadenopathy or easy bruising Neurological:Denies numbness, tingling or new weaknesses Behavioral/Psych: Mood is stable, no new changes  All other systems were reviewed with the patient and are  negative.  PHYSICAL EXAMINATION: ECOG PERFORMANCE STATUS: 1 - Symptomatic but completely ambulatory  Vitals:   07/10/16 1504  BP: (!) 138/56  Pulse: 95  Resp: 18  Temp: 98.2 F (36.8 C)   Filed Weights   07/10/16 1504  Weight: 244 lb 3.2 oz (110.8 kg)    GENERAL:alert, no distress and comfortable SKIN: skin color, texture, turgor are normal, no rashes or significant lesions EYES: normal, conjunctiva are pink and non-injected, sclera clear OROPHARYNX:no exudate, no erythema and lips, buccal mucosa, and tongue normal  NECK: supple, thyroid normal size, non-tender, without nodularity LYMPH:  no palpable lymphadenopathy in the cervical, axillary or inguinal LUNGS: clear to auscultation and percussion with normal breathing effort HEART: regular rate & rhythm and no murmurs and no lower extremity edema ABDOMEN:abdomen soft, non-tender and normal bowel sounds Musculoskeletal:no cyanosis of digits and no clubbing  PSYCH: alert & oriented x 3 with fluent speech NEURO: no focal motor/sensory deficits Breasts: Breast inspection showed them to be symmetrical with no nipple discharge. The surgical incision above the left breast nipple is well healed, (+)  Diffuse skin pigmentation in the left breast, skin is intact, mild edema, exam of the both breasts and bilateral axillars was negative for mass or adenopathy.  LABORATORY DATA:  I have reviewed the data as listed Lab Results  Component Value Date   WBC 9.4 06/28/2016   HGB 14.1 06/28/2016   HCT 41.9 06/28/2016   MCV 83.6 06/28/2016   PLT 333 06/28/2016   CMP Latest Ref Rng & Units 06/28/2016 05/06/2016 05/05/2016  Glucose 65 - 99 mg/dL 94 93 101(H)  BUN 6 - 20 mg/dL <5(L) 7.8 10  Creatinine 0.44 - 1.00 mg/dL 1.11(H) 1.0 1.01(H)  Sodium 135 - 145 mmol/L 137 138 134(L)  Potassium 3.5 - 5.1 mmol/L 3.9 3.8 4.3  Chloride 101 - 111 mmol/L 108 - 105  CO2 22 - 32 mmol/L _0 Calcium 8.9 - 10.3 mg/dL 9.1 8.7 8.5(L)  Total Protein 6.5 -  8.1 g/dL 7.3 7.0 6.7  Total Bilirubin 0.3 - 1.2 mg/dL 0.9 0.56 0.9  Alkaline Phos 38 - 126  U/L 118 102 86  AST 15 - 41 U/L _0 ALT 14 - 54 U/L _1 PATHOLOGY REPORT: Diagnosis 10/11/2015  1. Breast, lumpectomy, Left - INVASIVE DUCTAL CARCINOMA, SEE COMMENT. - INVASIVE TUMOR IS 2 MM FROM THE NEAREST MARGIN (SUPERIOR). - PREVIOUS BIOPSY SITE - SEE TUMOR SYNOPTIC TEMPLATE BELOW 2. Lymph node, sentinel, biopsy, Left axillary - ONE LYMPH NODE, NEGATIVE FOR TUMOR (0/1). - PLEASE SEE COMMENT. 3. Lymph node, sentinel, biopsy, Left axillary #2 - ONE LYMPH NODE, NEGATIVE FOR TUMOR (0/1). - PLEASE SEE COMMENT. 4. Lymph node, sentinel, biopsy, Left axillary #3 - ONE LYMPH NODE, NEGATIVE FOR TUMOR (0/1) - PLEASE SEE COMMENT. Microscopic Comment 1. BREAST, INVASIVE TUMOR, WITH LYMPH NODES PRESENT Specimen, including laterality and lymph node sampling (sentinel, non-sentinel): Left breast with sentinel lymph node sampling. Procedure: Lumpectomy. Histologic type: Ductal Grade: 1 of 3 Tubule formation: 2 Nuclear pleomorphism: 2 Mitotic:1 Tumor size (gross measurement): 3.0 cm Margins: Invasive, distance to closest margin: 2 mm (superior) In-situ, distance to closest margin: N/A If margin positive, focally or broadly: N/A Lymphovascular invasion: Absent. Ductal carcinoma in situ: Absent. Grade: N/A Extensive intraductal component: N/A Lobular neoplasia: Absent. Tumor focality: Unifocal, see comment. Treatment effect: None If present, treatment effect in breast tissue, lymph nodes or both: N/A Extent of tumor: Skin: N/A Nipple: N/A Skeletal muscle: N/A Lymph nodes: Examined: 3 Sentinel 0 Non-sentinel 3 Total Lymph nodes with metastasis: 0 Isolated tumor cells (< 0.2 mm): N/A Micrometastasis: (> 0.2 mm and < 2.0 mm): N/A Macrometastasis: (> 2.0 mm): N/A Extracapsular extension: N/A Breast prognostic profile: Estrogen receptor: Not repeated, previous study  demonstrated 90% positivity ([id number]) Progesterone receptor: Not repeated, previous study demonstrated 90% positivity ([id number]) Her 2 neu: Repeated previous study demonstrated no amplification (1.27) ([id number]) Ki-67: Not repeated previous study demonstrated 25% proliferation rate ([id number]) Non-neoplastic breast: Previous biopsy site tissue changes. TNM: pT2, pN0, pMX Comments: Representative sections from the 3.0 cm stellate lesion with associated ribbon shaped clip demonstrate diagnostic features of invasive ductal carcinoma. Within a representative section from firm tissue away from the grossly identified primary mass, there is invasive ductal caricnoma with extensive tissue fibrosis that is 2 mm fromtransfusion the nearest superior margin (slide 1H). Given that invasive tumor is infiltrating within grossly inapparent fibrous tissue, the invasive tumor is slide 1H is consider to represent grossly inapparent expansion of the primary 3.0cm mass and not multifocal disease. As such, the invasive tumor is considered to be at least 3.0cm in size. (CRR:gt, 10/13/15) 2. 3, and 4. There are no intranodal malignant metastatic tumor deposits identified on routine histology or with cytokeratin AE1/3 immunostains.   Results: HER2 - NEGATIVE RATIO OF HER2/CEP17 SIGNALS 1.18 AVERAGE HER2 COPY NUMBER PER CELL 2.30  ONCOTYPE DX RS 17 (low risk), which predicts 10 year risk of distant recurrence with tamoxifen alone 11%.  RADIOGRAPHIC STUDIES: I have personally reviewed the radiological images as listed and agreed with the findings in the report.  Mm Diag Breast Tomo Bilateral and [place]   08/29/2015  ADDENDUM REPORT: 08/29/2015 08:40 ADDENDUM: The left axilla was not evaluated during this exam. Left axillary ultrasound may be performed at the time of biopsy. Electronically Signed   By: Franki Cabot M.D.   On: 08/29/2015 08:40  08/29/2015  CLINICAL DATA:  Left breast lump EXAM: DIGITAL  DIAGNOSTIC BILATERAL MAMMOGRAM WITH 3D TOMOSYNTHESIS WITH CAD ULTRASOUND LEFT BREAST COMPARISON:  Previous exam(s). ACR Breast Density Category b: There are scattered areas  of fibroglandular density. FINDINGS: Bilateral CC and MLO views were obtained with 3D tomosynthesis. Additional spot compression view, with tomosynthesis, performed at the site of the palpable abnormality in the upper left breast which is demarcated with an overlying skin marker. There is architectural distortion with upper inner quadrant of the left breast, 11 o'clock axis region, corresponding to the site of palpable abnormality. There are no dominant masses, suspicious calcifications or secondary signs of malignancy within the right breast. Mammographic images were processed with CAD. On physical exam, skin retraction noted within the upper left breast. Targeted ultrasound is performed, showing an irregular shadowing mass within the left breast at the 11 o'clock axis, 7 cm from the nipple, measuring 1.2 x 0.9 x 0.6 cm, corresponding to the site of palpable abnormality and mammographic finding. IMPRESSION: 1. Irregular shadowing mass within the left breast at the 11 o'clock axis, 7 cm from the nipple, measuring 1.2 x 0.9 x 0.6 cm, corresponding to the site of palpable abnormality and skin retraction, also corresponding to the area of architectural distortion on mammogram. This is a highly suspicious finding for which ultrasound-guided biopsy is recommended. 2.  No evidence of malignancy within the right breast. RECOMMENDATION: Ultrasound-guided biopsy for the irregular shadowing mass in the left breast at the 11 o'clock axis, 7 cm from the nipple, measuring 1.2 x 0.9 x 0.6 cm. Postprocedure mammogram to ensure mammographic and sonographic correlation. Ultrasound-guided biopsy is scheduled for November 4th at 8 a.m. I have discussed the findings and recommendations with the patient. Results were also provided in writing at the conclusion of the  visit. If applicable, a reminder letter will be sent to the patient regarding the next appointment. BI-RADS CATEGORY  5: Highly suggestive of malignancy. Electronically Signed: By: Franki Cabot M.D. On: 08/25/2015 13:59     ASSESSMENT & PLAN: 45 year old female with past medical history of asthma, obesity, anxiety and depression, presented with left breast pain.  She had hysterectomy.   1. 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 -I discussed her Oncotype DX genomic test result. She has low risk recurrence score 17, which predicts of 11% 10 year risk of distant recurrence with tamoxifen alone. She will not benefit from adjuvant chemotherapy. -She has started adjuvant tamoxifen, developed moderate hot flashes and mood swing -I recommend her try Zoloft -She is agreeable to continue tamoxifen -Continue breast cancer surveillance, including annual mammogram, routine follow-up with lab and physical exam.   2. Depression and mood swing -Likely due to tamoxifen and stress at home, she is not suicidal -I recommend her to try Effexor 75 mg daily  3.  Chronic back pain - likely related to her morbid obesity and degenerative changes -improved some lately  - she  Will continue Tylenol and ibuprofen as needed, she does not want to have narcotics due to her constipation  4. Genetics -She is a very young, we recommended genetic testing for inheritable breast/ovarian cancer syndrome. She was tested for [facility] gene panel which was negative.  5. Asthma, anxiety and obesity  - she'll follow-up with her primary care physician   Plan -conitnue tamoxifen  -add Effexor 75 mg daily for hot flash and mood swing  - I'll see her back in 2 months - We'll set up survivorship clinic visit on next visit  All questions  were answered. The  patient knows to call the clinic with any problems, questions or concerns. I spent 25 minutes counseling the patient face to face. The total time spent in the appointment was 30 minutes and more than 50% was on counseling.     Truitt Merle, MD 07/10/2016 3:26 PM

## 2016-07-15 ENCOUNTER — Telehealth: Payer: Self-pay | Admitting: Hematology

## 2016-07-15 NOTE — Telephone Encounter (Signed)
Spoke with patient re f/u 11/13

## 2016-09-09 ENCOUNTER — Ambulatory Visit: Payer: Disability Insurance | Admitting: Hematology

## 2016-09-09 ENCOUNTER — Telehealth: Payer: Self-pay | Admitting: Hematology

## 2016-09-09 NOTE — Telephone Encounter (Signed)
Appointment rescheduled Per patient request. Patient's transportation/ daughter will not get off from work until after 2 PM. Patient would not be able to make it to center before 2:30PM. Patient requested to reschedule appointment.
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
## 2016-09-13 ENCOUNTER — Encounter (HOSPITAL_COMMUNITY): Payer: Self-pay | Admitting: *Deleted

## 2016-09-13 ENCOUNTER — Emergency Department (HOSPITAL_COMMUNITY)
Admission: EM | Admit: 2016-09-13 | Discharge: 2016-09-13 | Disposition: A | Discharge status: Home / Self Care | Payer: Medicaid Other | Attending: Emergency Medicine | Admitting: Emergency Medicine

## 2016-09-13 DIAGNOSIS — R112 Nausea with vomiting, unspecified: Secondary | ICD-10-CM | POA: Insufficient documentation

## 2016-09-13 DIAGNOSIS — M79602 Pain in left arm: Secondary | ICD-10-CM | POA: Insufficient documentation

## 2016-09-13 DIAGNOSIS — M79601 Pain in right arm: Secondary | ICD-10-CM | POA: Insufficient documentation

## 2016-09-13 DIAGNOSIS — M25521 Pain in right elbow: Secondary | ICD-10-CM | POA: Insufficient documentation

## 2016-09-13 DIAGNOSIS — M25522 Pain in left elbow: Secondary | ICD-10-CM | POA: Diagnosis not present

## 2016-09-13 DIAGNOSIS — Z853 Personal history of malignant neoplasm of breast: Secondary | ICD-10-CM | POA: Diagnosis not present

## 2016-09-13 DIAGNOSIS — F17210 Nicotine dependence, cigarettes, uncomplicated: Secondary | ICD-10-CM | POA: Diagnosis not present

## 2016-09-13 DIAGNOSIS — M791 Myalgia, unspecified site: Secondary | ICD-10-CM

## 2016-09-13 DIAGNOSIS — Z79899 Other long term (current) drug therapy: Secondary | ICD-10-CM | POA: Insufficient documentation

## 2016-09-13 DIAGNOSIS — J45909 Unspecified asthma, uncomplicated: Secondary | ICD-10-CM | POA: Diagnosis not present

## 2016-09-13 HISTORY — DX: Chikungunya virus disease: A92.0

## 2016-09-13 LAB — I-STAT CHEM 8, ED
BUN: 6 mg/dL (ref 6–20)
Calcium, Ion: 1.13 mmol/L — ABNORMAL LOW (ref 1.15–1.40)
Chloride: 109 mmol/L (ref 101–111)
Creatinine, Ser: 1 mg/dL (ref 0.44–1.00)
GLUCOSE: 72 mg/dL (ref 65–99)
HEMATOCRIT: 41 % (ref 36.0–46.0)
HEMOGLOBIN: 13.9 g/dL (ref 12.0–15.0)
POTASSIUM: 4.1 mmol/L (ref 3.5–5.1)
SODIUM: 144 mmol/L (ref 135–145)
TCO2: 23 mmol/L (ref 0–100)

## 2016-09-13 LAB — I-STAT TROPONIN, ED: TROPONIN I, POC: 0 ng/mL (ref 0.00–0.08)

## 2016-09-13 LAB — I-STAT BETA HCG BLOOD, ED (MC, WL, AP ONLY)

## 2016-09-13 MED ORDER — ONDANSETRON HCL 4 MG PO TABS: 4.0000 mg | ORAL_TABLET | Freq: Three times a day (TID) | ORAL | 0 refills | Status: DC | PRN

## 2016-09-13 MED ORDER — KETOROLAC TROMETHAMINE 60 MG/2ML IM SOLN
60.0000 mg | Freq: Once | INTRAMUSCULAR | Status: AC
  Administered 2016-09-13: 60 mg via INTRAMUSCULAR
  Filled 2016-09-13: qty 2

## 2016-09-13 MED ORDER — NAPROXEN 375 MG PO TABS: 375.0000 mg | ORAL_TABLET | Freq: Two times a day (BID) | ORAL | 0 refills | Status: AC | PRN

## 2016-09-13 NOTE — ED Provider Notes (Signed)
[facility] DEPT Provider Note   CSN: [id number] Arrival date & time: 09/13/16  2034     History   Chief Complaint Chief Complaint  Patient presents with  . Arm Pain    HPI Yolanda White is a 45 y.o. female.  HPI 45 year old female with past medical history of chikungunya virus who presents with bilateral elbow and forearm pain. The patient states that over the last several days, she has had mild nausea and intermittent vomiting. She went out with friends last night and drank a large amount of alcohol. Upon awakening this morning, she has had bilateral aching, throbbing, cramp-like elbow pain that radiates to her wrist. It is tender to palpation. She denies any trauma and thought that she slept differently on it when she was drinking. However, it has not improved throughout the day and she subsequently presents for evaluation. She has not taken any medications for this. She states it feels similar to the time that she had chikungunya but denies any fevers or rash. Of note, her sister also has similar symptoms with mild nausea.  Past Medical History:  Diagnosis Date  . Anxiety   . Asthma   . Breast cancer ([place])   . Breast cancer of upper-inner quadrant of left female breast ([place]) 09/08/2015  . 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 Depression   . Seasonal allergies     Patient Active Problem List   Diagnosis Date Noted  . Depression 07/09/2016  . Back pain 05/06/2016  . Asthma 02/06/2016  . Asthma exacerbation 02/06/2016  . Emesis   . Morbid obesity ([place])   . Genetic testing 09/26/2015  . Cigarette smoker 09/21/2015  . Severe obesity (BMI >= 40) ([place]) 09/21/2015  . Dyspnea 09/20/2015  . Breast cancer of upper-inner quadrant of left female breast ([facility]) 09/08/2015    Past Surgical History:  Procedure Laterality Date  . ABDOMINAL HYSTERECTOMY    . RADIOACTIVE SEED GUIDED MASTECTOMY WITH AXILLARY SENTINEL LYMPH NODE BIOPSY Left  10/11/2015   Procedure: RADIOACTIVE SEED LOCALIZATION LEFT BREAST LUMPECTOMY WITH LEFT  AXILLARY SENTINEL LYMPH NODE BIOPSY;  Surgeon: Excell Seltzer, MD;  Location: [facility];  Service: General;  Laterality: Left;  . TUBAL LIGATION      OB History    Gravida Para Term Preterm AB Living   4 3 3   1 3    SAB TAB Ectopic Multiple Live Births                   Home Medications    Prior to Admission medications   Medication Sig Start Date End Date Taking? Authorizing Provider  albuterol (VENTOLIN HFA) 108 (90 BASE) MCG/ACT inhaler Inhale 2 puffs into the lungs every 6 (six) hours as needed for wheezing or shortness of breath. Reported on 11/16/2015   Yes Historical Provider, MD  cyclobenzaprine (FLEXERIL) 10 MG tablet Take 10 mg by mouth 3 (three) times daily as needed for muscle spasms.   Yes Historical Provider, MD  dicyclomine (BENTYL) 20 MG tablet Take 1 tablet (20 mg total) by mouth 2 (two) times daily as needed for spasms (abdominal cramping). 05/05/16  Yes Sharlett Iles, MD  gabapentin (NEURONTIN) 100 MG capsule Take 1 capsule (100 mg total) by mouth at bedtime. 07/10/16  Yes Truitt Merle, MD  ibuprofen (ADVIL,MOTRIN) 800 MG tablet Take 800 mg by mouth 3 (three) times daily as needed (pain).  04/17/16  Yes Historical Provider,  MD  nabumetone (RELAFEN) 750 MG tablet Take 750 mg by mouth 2 (two) times daily as needed for mild pain or moderate pain.  08/22/16  Yes Historical Provider, MD  prazosin (MINIPRESS) 2 MG capsule take 1 capsule BY MOUTH at bedtime for nightmares 08/30/16  Yes Historical Provider, MD  tamoxifen (NOLVADEX) 20 MG tablet Take 1 tablet (20 mg total) by mouth daily. 05/06/16  Yes Truitt Merle, MD  traZODone (DESYREL) 150 MG tablet take 1 TABLET BY MOUTH 1 hour before bedtime 08/30/16  Yes Historical Provider, MD  cetirizine (ZYRTEC) 10 MG tablet Take 10 mg by mouth daily. 09/03/16   Historical Provider, MD  naproxen (NAPROSYN) 375 MG tablet Take 1 tablet (375  mg total) by mouth 2 (two) times daily as needed for moderate pain. 09/13/16 09/20/16  Duffy Bruce, MD  ondansetron (ZOFRAN ODT) 4 MG disintegrating tablet Take 1 tablet (4 mg total) by mouth every 8 (eight) hours as needed for nausea or vomiting. 05/05/16   Sharlett Iles, MD  ondansetron (ZOFRAN) 4 MG tablet Take 1 tablet (4 mg total) by mouth every 6 (six) hours as needed for nausea or vomiting. Patient not taking: Reported on 09/13/2016 06/28/16   Ivin Booty, MD  ondansetron (ZOFRAN) 4 MG tablet Take 1 tablet (4 mg total) by mouth every 8 (eight) hours as needed for nausea or vomiting. 09/13/16   Duffy Bruce, MD  sertraline (ZOLOFT) 100 MG tablet take 1 and ONE-HALF in the morning for depression/anxiety 08/27/16   Historical Provider, MD  venlafaxine XR (EFFEXOR-XR) 75 MG 24 hr capsule Take 1 capsule (75 mg total) by mouth daily with breakfast. Patient not taking: Reported on 09/13/2016 05/06/16   Truitt Merle, MD    Family History Family History  Problem Relation Age of Onset  . Diabetes Mother   . Diabetes Father   . Lung cancer Father 49    smoker  . Cancer Paternal Aunt     cervical  . Diabetes Brother   . Heart attack Paternal Grandmother     in her 39s  . Diabetes Brother     maternal half brother  . Asthma Brother   . Asthma Maternal Aunt     Social History Social History  Substance Use Topics  . Smoking status: Current Every Day Smoker    Packs/day: 0.50    Years: 34.00    Types: Cigarettes  . Smokeless tobacco: Never Used  . Alcohol use No     Allergies   Patient has no known allergies.   Review of Systems Review of Systems  Constitutional: Positive for fatigue. Negative for chills and fever.  HENT: Negative for congestion and rhinorrhea.   Eyes: Negative for visual disturbance.  Respiratory: Negative for cough, shortness of breath and wheezing.   Cardiovascular: Negative for chest pain and leg swelling.  Gastrointestinal: Positive for nausea and  vomiting. Negative for abdominal pain and diarrhea.  Genitourinary: Negative for dysuria and flank pain.  Musculoskeletal: Positive for arthralgias and myalgias. Negative for neck pain and neck stiffness.  Skin: Negative for rash and wound.  Allergic/Immunologic: Negative for immunocompromised state.  Neurological: Negative for syncope, weakness and headaches.  All other systems reviewed and are negative.    Physical Exam Updated Vital Signs BP 120/77 (BP Location: Right Arm)   Pulse 71   Temp 98.1 F (36.7 C) (Oral)   Resp 17   SpO2 99%   Physical Exam  Constitutional: She is oriented to person, place, and time. She  appears well-developed and well-nourished. No distress.  HENT:  Head: Normocephalic and atraumatic.  Eyes: Conjunctivae are normal.  Neck: Neck supple.  Cardiovascular: Normal rate, regular rhythm and normal heart sounds.  Exam reveals no friction rub.   No murmur heard. Pulmonary/Chest: Effort normal and breath sounds normal. No respiratory distress. She has no wheezes. She has no rales.  Abdominal: Soft. Bowel sounds are normal. She exhibits no distension. There is no tenderness.  Musculoskeletal: She exhibits no edema.  Mild muscular and bony tenderness to palpation along bilateral upper extremities from elbows to wrist. No bruising or deformity. No swelling. No erythema.  Neurological: She is alert and oriented to person, place, and time. She exhibits normal muscle tone.  Skin: Skin is warm. Capillary refill takes less than 2 seconds.  Psychiatric: She has a normal mood and affect.  Nursing note and vitals reviewed.    ED Treatments / Results  Labs (all labs ordered are listed, but only abnormal results are displayed) Labs Reviewed  I-STAT CHEM 8, ED - Abnormal; Notable for the following:       Result Value   Calcium, Ion 1.13 (*)    All other components within normal limits  I-STAT TROPOININ, ED  I-STAT BETA HCG BLOOD, ED ([facility], [facility], [facility] ONLY)    EKG   EKG Interpretation  Date/Time:  Friday September 13 2016 22:00:11 EST Ventricular Rate:  67 PR Interval:  148 QRS Duration: 78 QT Interval:  420 QTC Calculation: 443 R Axis:   25 Text Interpretation:  Normal sinus rhythm Low voltage QRS Borderline ECG No significant change since last tracing Confirmed by Caela Huot MD, Lysbeth Galas [id number]) on 09/13/2016 10:10:24 PM       Radiology No results found.  Procedures Procedures (including critical care time)  Medications Ordered in ED Medications  ketorolac (TORADOL) injection 60 mg (60 mg Intramuscular Given 09/13/16 2140)     Initial Impression / Assessment and Plan / ED Course  I have reviewed the triage vital signs and the nursing notes.  Pertinent labs & imaging results that were available during my care of the patient were reviewed by me and considered in my medical decision making (see chart for details).  Clinical Course     45 yo female with PMHx as above who presents with bilateral arm and elbow pain in setting of nausea/vomiting x several days and drinking heavily last night. On arrival, VSS and WNL. Exam is as above. iStat chem shows normal renal function and lytes. iStat trop 0.00, EKG non-ischemic, and I do not suspect referred pain from cardiac/ACS etiology. hCG negative. She has no neck pain, neck stiffness, arm numbness/weakness or sx to suggest radiculopathy or cervical spine etiology. My primary suspicion is benign myalgias, possibly made worse in setting of EtOH use and dehydration, in setting of known viral GI illness with exposure. No history to suggest rhabdo or myopathy, and strength is nromal.. Her abdomen is o/w soft, NT, ND, with no evidence of cholecystitis, appendicitis, diverticulitis, or other acute abnormality. Will d/c with NSAIDs, antiemetics, and supportive care. Pt in agreement. Return precautions given.  Final Clinical Impressions(s) / ED Diagnoses   Final diagnoses:  Myalgia  Non-intractable vomiting with  nausea, unspecified vomiting type    New Prescriptions Discharge Medication List as of 09/13/2016 11:05 PM    START taking these medications   Details  naproxen (NAPROSYN) 375 MG tablet Take 1 tablet (375 mg total) by mouth 2 (two) times daily as needed for moderate pain., Starting  Fri 09/13/2016, Until Fri 09/20/2016, Print    !! ondansetron (ZOFRAN) 4 MG tablet Take 1 tablet (4 mg total) by mouth every 8 (eight) hours as needed for nausea or vomiting., Starting Fri 09/13/2016, Print     !! - Potential duplicate medications found. Please discuss with provider.       Duffy Bruce, MD 09/14/16 [phone]

## 2016-09-13 NOTE — ED Triage Notes (Signed)
Pt is here for bilateral arm pain from elbow and radiating down.  No associated with any trauma.  Pt explained to EMS that she contracted the chikungunya virus in 2010 while [place] and she states that "this feels the same".  VSS, pt is alert and oriented. No recent travel outside the country, no trauma.

## 2016-09-13 NOTE — ED Notes (Signed)
Bed: WA02 Expected date:  Expected time:  Means of arrival:  Comments: 45 yo F  Joint pain, headache

## 2016-09-13 NOTE — ED Notes (Signed)
Pt reports bila lower arm pain all day today.  States she was drinking all last night and thought that it could just be from hang over.  States pain never subsided.  Reports hx of Chikungunya virus and pain feels the same.  States her and sister both had the virus and is having the same pain in her arms.

## 2016-09-16 ENCOUNTER — Encounter: Payer: Self-pay | Admitting: Hematology

## 2016-09-16 ENCOUNTER — Telehealth: Payer: Self-pay | Admitting: Hematology

## 2016-09-16 ENCOUNTER — Ambulatory Visit (HOSPITAL_BASED_OUTPATIENT_CLINIC_OR_DEPARTMENT_OTHER): Payer: Medicaid Other | Admitting: Hematology

## 2016-09-16 VITALS — BP 117/83 | HR 65 | Temp 98.6°F | Resp 18 | Ht 62.0 in | Wt 240.0 lb

## 2016-09-16 DIAGNOSIS — M545 Low back pain: Secondary | ICD-10-CM | POA: Diagnosis not present

## 2016-09-16 DIAGNOSIS — F329 Major depressive disorder, single episode, unspecified: Secondary | ICD-10-CM

## 2016-09-16 DIAGNOSIS — M791 Myalgia: Secondary | ICD-10-CM | POA: Diagnosis not present

## 2016-09-16 DIAGNOSIS — F32A Depression, unspecified: Secondary | ICD-10-CM

## 2016-09-16 DIAGNOSIS — F419 Anxiety disorder, unspecified: Secondary | ICD-10-CM | POA: Diagnosis not present

## 2016-09-16 DIAGNOSIS — N951 Menopausal and female climacteric states: Secondary | ICD-10-CM | POA: Diagnosis not present

## 2016-09-16 DIAGNOSIS — G8929 Other chronic pain: Secondary | ICD-10-CM

## 2016-09-16 DIAGNOSIS — C50212 Malignant neoplasm of upper-inner quadrant of left female breast: Secondary | ICD-10-CM | POA: Diagnosis present

## 2016-09-16 DIAGNOSIS — Z170 Estrogen receptor positive status [ER+]: Secondary | ICD-10-CM

## 2016-09-16 DIAGNOSIS — Z720 Tobacco use: Secondary | ICD-10-CM

## 2016-09-16 NOTE — Telephone Encounter (Signed)
Appointments scheduled per 09/16/16 los. AVS and appointment schedule was given to patient, per 09/16/16 los.
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
## 2016-09-23 ENCOUNTER — Encounter (HOSPITAL_COMMUNITY): Payer: Self-pay | Admitting: Emergency Medicine

## 2016-09-23 ENCOUNTER — Emergency Department (HOSPITAL_COMMUNITY)
Admission: EM | Admit: 2016-09-23 | Discharge: 2016-09-23 | Disposition: A | Discharge status: Home / Self Care | Payer: Medicaid Other | Attending: Emergency Medicine | Admitting: Emergency Medicine

## 2016-09-23 ENCOUNTER — Emergency Department (HOSPITAL_COMMUNITY): Payer: Medicaid Other

## 2016-09-23 DIAGNOSIS — Z853 Personal history of malignant neoplasm of breast: Secondary | ICD-10-CM | POA: Diagnosis not present

## 2016-09-23 DIAGNOSIS — J45909 Unspecified asthma, uncomplicated: Secondary | ICD-10-CM | POA: Insufficient documentation

## 2016-09-23 DIAGNOSIS — Z79899 Other long term (current) drug therapy: Secondary | ICD-10-CM | POA: Insufficient documentation

## 2016-09-23 DIAGNOSIS — T5891XA Toxic effect of carbon monoxide from unspecified source, accidental (unintentional), initial encounter: Secondary | ICD-10-CM

## 2016-09-23 DIAGNOSIS — F17210 Nicotine dependence, cigarettes, uncomplicated: Secondary | ICD-10-CM | POA: Diagnosis not present

## 2016-09-23 DIAGNOSIS — T582X1A Toxic effect of carbon monoxide from incomplete combustion of other domestic fuels, accidental (unintentional), initial encounter: Secondary | ICD-10-CM | POA: Insufficient documentation

## 2016-09-23 LAB — CBC WITH DIFFERENTIAL/PLATELET
BASOS ABS: 0 10*3/uL (ref 0.0–0.1)
BASOS PCT: 0 %
EOS PCT: 4 %
Eosinophils Absolute: 0.6 10*3/uL (ref 0.0–0.7)
HCT: 36.8 % (ref 36.0–46.0)
Hemoglobin: 12.7 g/dL (ref 12.0–15.0)
Lymphocytes Relative: 13 %
Lymphs Abs: 1.8 10*3/uL (ref 0.7–4.0)
MCH: 28.7 pg (ref 26.0–34.0)
MCHC: 34.5 g/dL (ref 30.0–36.0)
MCV: 83.1 fL (ref 78.0–100.0)
MONO ABS: 0.7 10*3/uL (ref 0.1–1.0)
Monocytes Relative: 5 %
Neutro Abs: 10.9 10*3/uL — ABNORMAL HIGH (ref 1.7–7.7)
Neutrophils Relative %: 78 %
PLATELETS: 239 10*3/uL (ref 150–400)
RBC: 4.43 MIL/uL (ref 3.87–5.11)
RDW: 13.2 % (ref 11.5–15.5)
WBC: 14 10*3/uL — AB (ref 4.0–10.5)

## 2016-09-23 LAB — BASIC METABOLIC PANEL
ANION GAP: 9 (ref 5–15)
BUN: 9 mg/dL (ref 6–20)
CALCIUM: 8.8 mg/dL — AB (ref 8.9–10.3)
CO2: 22 mmol/L (ref 22–32)
Chloride: 104 mmol/L (ref 101–111)
Creatinine, Ser: 1.16 mg/dL — ABNORMAL HIGH (ref 0.44–1.00)
GFR, EST NON AFRICAN AMERICAN: 56 mL/min — AB (ref >60)
GLUCOSE: 124 mg/dL — AB (ref 65–99)
Potassium: 4.6 mmol/L (ref 3.5–5.1)
SODIUM: 135 mmol/L (ref 135–145)

## 2016-09-23 LAB — COOXEMETRY PANEL
CARBOXYHEMOGLOBIN: 9.8 % — AB (ref 0.5–1.5)
Methemoglobin: 1.4 % (ref 0.0–1.5)
O2 Saturation: 55.2 %
Total hemoglobin: 13.6 g/dL (ref 12.0–16.0)

## 2016-09-23 LAB — I-STAT ARTERIAL BLOOD GAS, ED
Acid-Base Excess: 1 mmol/L (ref 0.0–2.0)
BICARBONATE: 25.7 mmol/L (ref 20.0–28.0)
O2 Saturation: 100 %
TCO2: 27 mmol/L (ref 0–100)
pCO2 arterial: 38.5 mmHg (ref 32.0–48.0)
pH, Arterial: 7.432 (ref 7.350–7.450)
pO2, Arterial: 393 mmHg — ABNORMAL HIGH (ref 83.0–108.0)

## 2016-09-23 MED ORDER — SODIUM CHLORIDE 0.9 % IV BOLUS (SEPSIS)
1000.0000 mL | Freq: Once | INTRAVENOUS | Status: AC
  Administered 2016-09-23: 1000 mL via INTRAVENOUS

## 2016-09-23 MED ORDER — LOPERAMIDE HCL 2 MG PO CAPS
2.0000 mg | ORAL_CAPSULE | Freq: Once | ORAL | Status: AC
  Administered 2016-09-23: 2 mg via ORAL
  Filled 2016-09-23: qty 1

## 2016-09-23 MED ORDER — DICYCLOMINE HCL 10 MG PO CAPS
20.0000 mg | ORAL_CAPSULE | Freq: Once | ORAL | Status: AC
  Administered 2016-09-23: 20 mg via ORAL
  Filled 2016-09-23: qty 2

## 2016-09-23 NOTE — ED Triage Notes (Signed)
Brought via EMS for CO2 exposure.  Level reported as 36 initially down to 28 on arrival to ED.  C/O dizziness and had one incontinent soft brown stool.
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
## 2016-10-29 ENCOUNTER — Ambulatory Visit
Admission: RE | Admit: 2016-10-29 | Discharge: 2016-10-29 | Disposition: A | Discharge status: Home / Self Care | Payer: Disability Insurance | Source: Ambulatory Visit | Attending: Hematology | Admitting: Hematology

## 2016-10-29 DIAGNOSIS — C50212 Malignant neoplasm of upper-inner quadrant of left female breast: Secondary | ICD-10-CM

## 2016-10-29 DIAGNOSIS — Z170 Estrogen receptor positive status [ER+]: Principal | ICD-10-CM

## 2016-10-31 ENCOUNTER — Other Ambulatory Visit: Payer: Self-pay | Admitting: Hematology

## 2016-11-28 ENCOUNTER — Other Ambulatory Visit: Payer: Self-pay | Admitting: Hematology

## 2016-11-28 ENCOUNTER — Telehealth: Payer: Self-pay | Admitting: *Deleted

## 2016-11-28 NOTE — Telephone Encounter (Signed)
"  Someone called me.  NO message was left.  I called last week for records.  I moved to [facility]. Two weeks ago.  I learned I need to re-apply for Medicaid because it does not transfer.  I need my records from Dr. Burr Medico according to the staff at [facility] [phone]) [place], [place].  I do not have a doctor or an appointment but would like this information to get this process moving and at least verify who I am.  My phone number is the same for now [phone]).  New address:  [address], [place], [place]., [place]."

## 2016-11-30 ENCOUNTER — Telehealth: Payer: Self-pay | Admitting: Hematology

## 2016-11-30 NOTE — Telephone Encounter (Signed)
S/w pt to advised of appt chg on 2/19 due to md pal. Pt states [place] has [place] out of state but has a problem that she has received no direction with. Pt states per her pharmacy, her scripts have been cancelled. Pty says no one told her that her meds would be cancelled and no one advised her on what to do without those meds. Pt's says she has eben calling but has not gotten a return call. I advised pt I would cx her 2/19 appt and leave a msg for the desk nurse to call her on Monday.

## 2016-12-16 ENCOUNTER — Ambulatory Visit: Payer: Disability Insurance | Admitting: Hematology

## 2016-12-16 ENCOUNTER — Other Ambulatory Visit: Payer: Disability Insurance
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
## 2017-04-15 ENCOUNTER — Encounter (HOSPITAL_COMMUNITY): Payer: Self-pay | Admitting: Emergency Medicine

## 2017-04-15 ENCOUNTER — Emergency Department (HOSPITAL_COMMUNITY)
Admission: EM | Admit: 2017-04-15 | Discharge: 2017-04-15 | Disposition: A | Discharge status: Home / Self Care | Payer: Medicaid Other | Attending: Emergency Medicine | Admitting: Emergency Medicine

## 2017-04-15 ENCOUNTER — Emergency Department (HOSPITAL_COMMUNITY): Payer: Medicaid Other

## 2017-04-15 DIAGNOSIS — R0602 Shortness of breath: Secondary | ICD-10-CM | POA: Diagnosis present

## 2017-04-15 DIAGNOSIS — Z79899 Other long term (current) drug therapy: Secondary | ICD-10-CM | POA: Diagnosis not present

## 2017-04-15 DIAGNOSIS — F17210 Nicotine dependence, cigarettes, uncomplicated: Secondary | ICD-10-CM | POA: Diagnosis not present

## 2017-04-15 DIAGNOSIS — J4521 Mild intermittent asthma with (acute) exacerbation: Secondary | ICD-10-CM | POA: Diagnosis not present

## 2017-04-15 DIAGNOSIS — J029 Acute pharyngitis, unspecified: Secondary | ICD-10-CM | POA: Diagnosis not present

## 2017-04-15 DIAGNOSIS — Z7951 Long term (current) use of inhaled steroids: Secondary | ICD-10-CM | POA: Insufficient documentation

## 2017-04-15 DIAGNOSIS — Z853 Personal history of malignant neoplasm of breast: Secondary | ICD-10-CM | POA: Diagnosis not present

## 2017-04-15 HISTORY — DX: Myalgia, unspecified site: M79.10

## 2017-04-15 LAB — BASIC METABOLIC PANEL
ANION GAP: 10 (ref 5–15)
BUN: 6 mg/dL (ref 6–20)
CALCIUM: 8.7 mg/dL — AB (ref 8.9–10.3)
CO2: 22 mmol/L (ref 22–32)
Chloride: 105 mmol/L (ref 101–111)
Creatinine, Ser: 1.06 mg/dL — ABNORMAL HIGH (ref 0.44–1.00)
GFR calc Af Amer: >60 mL/min (ref >60)
GLUCOSE: 151 mg/dL — AB (ref 65–99)
POTASSIUM: 3.4 mmol/L — AB (ref 3.5–5.1)
SODIUM: 137 mmol/L (ref 135–145)

## 2017-04-15 LAB — CBC
HEMATOCRIT: 38.2 % (ref 36.0–46.0)
HEMOGLOBIN: 13.3 g/dL (ref 12.0–15.0)
MCH: 28.9 pg (ref 26.0–34.0)
MCHC: 34.8 g/dL (ref 30.0–36.0)
MCV: 82.9 fL (ref 78.0–100.0)
Platelets: 186 10*3/uL (ref 150–400)
RBC: 4.61 MIL/uL (ref 3.87–5.11)
RDW: 12.9 % (ref 11.5–15.5)
WBC: 7.7 10*3/uL (ref 4.0–10.5)

## 2017-04-15 LAB — RAPID STREP SCREEN (MED CTR MEBANE ONLY): Streptococcus, Group A Screen (Direct): NEGATIVE

## 2017-04-15 LAB — I-STAT TROPONIN, ED: TROPONIN I, POC: 0 ng/mL (ref 0.00–0.08)

## 2017-04-15 MED ORDER — BENZONATATE 100 MG PO CAPS: 100.0000 mg | ORAL_CAPSULE | Freq: Three times a day (TID) | ORAL | 0 refills | Status: DC

## 2017-04-15 MED ORDER — PREDNISONE 50 MG PO TABS: 50.0000 mg | ORAL_TABLET | Freq: Every day | ORAL | 0 refills | Status: DC

## 2017-04-15 MED ORDER — ALBUTEROL SULFATE (2.5 MG/3ML) 0.083% IN NEBU
5.0000 mg | INHALATION_SOLUTION | Freq: Once | RESPIRATORY_TRACT | Status: AC
  Administered 2017-04-15: 5 mg via RESPIRATORY_TRACT
  Filled 2017-04-15: qty 6

## 2017-04-15 MED ORDER — ACETAMINOPHEN 500 MG PO TABS
1000.0000 mg | ORAL_TABLET | Freq: Once | ORAL | Status: AC
  Administered 2017-04-15: 1000 mg via ORAL
  Filled 2017-04-15: qty 2

## 2017-04-15 MED ORDER — KETOROLAC TROMETHAMINE 60 MG/2ML IM SOLN
60.0000 mg | Freq: Once | INTRAMUSCULAR | Status: AC
  Administered 2017-04-15: 60 mg via INTRAMUSCULAR
  Filled 2017-04-15: qty 2

## 2017-04-15 MED ORDER — PREDNISONE 20 MG PO TABS
60.0000 mg | ORAL_TABLET | Freq: Once | ORAL | Status: AC
  Administered 2017-04-15: 60 mg via ORAL
  Filled 2017-04-15: qty 3

## 2017-04-15 MED ORDER — IPRATROPIUM BROMIDE 0.02 % IN SOLN
0.5000 mg | Freq: Once | RESPIRATORY_TRACT | Status: AC
  Administered 2017-04-15: 0.5 mg via RESPIRATORY_TRACT
  Filled 2017-04-15: qty 2.5

## 2017-04-15 MED ORDER — ONDANSETRON 4 MG PO TBDP: 4.0000 mg | ORAL_TABLET | Freq: Three times a day (TID) | ORAL | 0 refills | Status: DC | PRN

## 2017-04-15 MED ORDER — ONDANSETRON 4 MG PO TBDP
4.0000 mg | ORAL_TABLET | Freq: Once | ORAL | Status: AC
  Administered 2017-04-15: 4 mg via ORAL
  Filled 2017-04-15: qty 1

## 2017-04-15 NOTE — ED Triage Notes (Signed)
Patient c/o shortness of breath, chest pain with coughing, generalized aching, swollen throat. Denies any fevers. Pt reports hx of asthma, taken inhaler at home with no relief.

## 2017-04-15 NOTE — ED Provider Notes (Signed)
[facility] DEPT Provider Note   CSN: [id number] Arrival date & time: 04/15/17  1503     History   Chief Complaint Chief Complaint  Patient presents with  . Shortness of Breath  . Flu like Symptoms    HPI Yolanda White is a 46 y.o. female.  HPI  Pt with hx of asthma presenting with c/o sore throat and cough.  Sore throat began yesterday and then today she began to have cough.  Cough is harsh and causes diffuse body aches.  She has also been having chills today.  Chest feels tight with coughing.  Cough is nonproductive.  She has been trying her albuterol inhaler with mild relief.  Also had an episode of emesis today.  Has been able to drink water today.  No abdominal pain.  Has a sick contact/family member with similar symptoms.  There are no other associated systemic symptoms, there are no other alleviating or modifying factors.   Past Medical History:  Diagnosis Date  . Anxiety   . Asthma   . Breast cancer ([place])   . Breast cancer of upper-inner quadrant of left female breast ([place]) 09/08/2015  . 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 Depression   . Myalgia   . Seasonal allergies     Patient Active Problem List   Diagnosis Date Noted  . Depression 07/09/2016  . Back pain 05/06/2016  . Asthma 02/06/2016  . Asthma exacerbation 02/06/2016  . Emesis   . Morbid obesity ([place])   . Genetic testing 09/26/2015  . Cigarette smoker 09/21/2015  . Severe obesity (BMI >= 40) ([place]) 09/21/2015  . Dyspnea 09/20/2015  . Breast cancer of upper-inner quadrant of left female breast ([place]) 09/08/2015    Past Surgical History:  Procedure Laterality Date  . ABDOMINAL HYSTERECTOMY    . RADIOACTIVE SEED GUIDED MASTECTOMY WITH AXILLARY SENTINEL LYMPH NODE BIOPSY Left 10/11/2015   Procedure: RADIOACTIVE SEED LOCALIZATION LEFT BREAST LUMPECTOMY WITH LEFT  AXILLARY SENTINEL LYMPH NODE BIOPSY;  Surgeon: Excell Seltzer, MD;  Location: [facility];  Service: General;  Laterality: Left;  . TUBAL LIGATION      OB History    Gravida Para Term Preterm AB Living   4 3 3   1 3    SAB TAB Ectopic Multiple Live Births                   Home Medications    Prior to Admission medications   Medication Sig Start Date End Date Taking? Authorizing Provider  albuterol (VENTOLIN HFA) 108 (90 BASE) MCG/ACT inhaler Inhale 2 puffs into the lungs every 6 (six) hours as needed for wheezing or shortness of breath. Reported on 11/16/2015   Yes [provider]  Camphor-Eucalyptus-Menthol (VICKS VAPORUB EX) Apply 1 application topically 2 (two) times daily as needed (cold symptoms).   Yes [provider]  cyclobenzaprine (FLEXERIL) 10 MG tablet Take 10 mg by mouth 3 (three) times daily as needed for muscle spasms.   Yes [provider]  gabapentin (NEURONTIN) 100 MG capsule Take 1 capsule (100 mg total) by mouth at bedtime. 10/31/16  Yes Truitt Merle, MD  albuterol (PROVENTIL) (2.5 MG/3ML) 0.083% nebulizer solution USE 1 VIAL BY MOUTH EVERY 6 HOURS AS NEEDED FOR SOB AND WHEEZING 02/06/17   [provider]  benzonatate (TESSALON) 100 MG capsule Take 1 capsule (100 mg total) by mouth every 8 (eight) hours. 04/15/17   Linker,  Jana Half, MD  dicyclomine (BENTYL) 20 MG tablet Take 1 tablet (20 mg total) by mouth 2 (two) times daily as needed for spasms (abdominal cramping). Patient not taking: Reported on 04/15/2017 05/05/16   Little, Wenda Overland, MD  ondansetron (ZOFRAN ODT) 4 MG disintegrating tablet Take 1 tablet (4 mg total) by mouth every 8 (eight) hours as needed for nausea or vomiting. 04/15/17   Alfonzo Beers, MD  ondansetron (ZOFRAN) 4 MG tablet Take 1 tablet (4 mg total) by mouth every 8 (eight) hours as needed for nausea or vomiting. Patient not taking: Reported on 09/16/2016 09/13/16   Duffy Bruce, MD  predniSONE (DELTASONE) 50 MG tablet Take 1 tablet (50 mg total) by mouth daily. 04/15/17   Alfonzo Beers, MD    tamoxifen (NOLVADEX) 20 MG tablet Take 1 tablet (20 mg total) by mouth daily. Patient not taking: Reported on 04/15/2017 05/06/16   Truitt Merle, MD  venlafaxine XR (EFFEXOR-XR) 75 MG 24 hr capsule Take 1 capsule (75 mg total) by mouth daily with breakfast. Patient not taking: Reported on 04/15/2017 05/06/16   Truitt Merle, MD    Family History Family History  Problem Relation Age of Onset  . Diabetes Mother   . Diabetes Father   . Lung cancer Father 26       smoker  . Cancer Paternal Aunt        cervical  . Diabetes Brother   . Heart attack Paternal Grandmother        in her 71s  . Diabetes Brother        maternal half brother  . Asthma Brother   . Asthma Maternal Aunt     Social History Social History  Substance Use Topics  . Smoking status: Current Every Day Smoker    Packs/day: 0.50    Years: 34.00    Types: Cigarettes  . Smokeless tobacco: Never Used  . Alcohol use No     Allergies   Patient has no known allergies.   Review of Systems Review of Systems  ROS reviewed and all otherwise negative except for mentioned in HPI   Physical Exam Updated Vital Signs BP 100/63 (BP Location: Right Arm)   Pulse 75   Temp 99.5 F (37.5 C) (Oral)   Resp 18   Ht 5' 2.5" (1.588 m)   Wt 99.8 kg (220 lb)   SpO2 93%   BMI 39.60 kg/m  Vitals reviewed Physical Exam Physical Examination: General appearance - alert, well appearing, and in no distress Mental status - alert, oriented to person, place, and time Eyes - no conjunctival injection, no scleral icterus Mouth - OP with moderate erythema and exudates present, palate symmetric, uvula midline Neck - supple, no significant adenopathy Chest - BSS, mild wheezing on expiration, good air movement Heart - normal rate, regular rhythm, normal S1, S2, no murmurs, rubs, clicks or gallops Abdomen - soft, nontender, nondistended, no masses or organomegaly Neurological - alert, oriented, normal speech Extremities - peripheral pulses  normal, no pedal edema, no clubbing or cyanosis Skin - normal coloration and turgor, no rashes  ED Treatments / Results  Labs (all labs ordered are listed, but only abnormal results are displayed) Labs Reviewed  BASIC METABOLIC PANEL - Abnormal; Notable for the following:       Result Value   Potassium 3.4 (*)    Glucose, Bld 151 (*)    Creatinine, Ser 1.06 (*)    Calcium 8.7 (*)    All other components within normal limits  RAPID STREP SCREEN (NOT AT [facility])  CULTURE, GROUP A STREP [facility])  CBC  I-STAT TROPOININ, ED    EKG  EKG Interpretation  Date/Time:  Tuesday April 15 2017 15:19:02 EDT Ventricular Rate:  101 PR Interval:    QRS Duration: 62 QT Interval:  384 QTC Calculation: 498 R Axis:   24 Text Interpretation:  Sinus tachycardia Low voltage, extremity and precordial leads Borderline prolonged QT interval Since previous tracing QT interval is lengthened Confirmed by Alfonzo Beers [phone]) on 04/15/2017 6:35:59 PM       Radiology Dg Chest 2 View  Result Date: 04/15/2017 CLINICAL DATA:  Shortness of breath with chest pain and coughing. EXAM: CHEST  2 VIEW COMPARISON:  09/23/2016 FINDINGS: Surgical clips overlying the left chest. Both lungs are clear. Heart and mediastinum are within normal limits. The trachea is midline. No pleural effusions. Mild degenerative endplate changes in the thoracic spine. IMPRESSION: No active cardiopulmonary disease. Electronically Signed   By: Markus Daft M.D.   On: 04/15/2017 16:22    Procedures Procedures (including critical care time)  Medications Ordered in ED Medications  albuterol (PROVENTIL) (2.5 MG/3ML) 0.083% nebulizer solution 5 mg (5 mg Nebulization Given 04/15/17 1859)  ipratropium (ATROVENT) nebulizer solution 0.5 mg (0.5 mg Nebulization Given 04/15/17 1859)  ketorolac (TORADOL) injection 60 mg (60 mg Intramuscular Given 04/15/17 1854)  ondansetron (ZOFRAN-ODT) disintegrating tablet 4 mg (4 mg Oral Given 04/15/17 1853)  albuterol  (PROVENTIL) (2.5 MG/3ML) 0.083% nebulizer solution 5 mg (5 mg Nebulization Given 04/15/17 2033)  ipratropium (ATROVENT) nebulizer solution 0.5 mg (0.5 mg Nebulization Given 04/15/17 2033)  predniSONE (DELTASONE) tablet 60 mg (60 mg Oral Given 04/15/17 2032)  acetaminophen (TYLENOL) tablet 1,000 mg (1,000 mg Oral Given 04/15/17 2033)     Initial Impression / Assessment and Plan / ED Course  I have reviewed the triage vital signs and the nursing notes.  Pertinent labs & imaging results that were available during my care of the patient were reviewed by me and considered in my medical decision making (see chart for details).     Pt presenting with sore throat, cough and wheezing.  Rapid strep is negative, she has mild wheezing on exam which is resolved after nebs in the ED.  CXR does not show pneumonia.  She was started on prednisone, given toradol and zofran for symptom relief.  She was able to tolerate po fluids in the ED prior to discharge.  Discharged with strict return precautions.  Pt agreeable with plan.  Final Clinical Impressions(s) / ED Diagnoses   Final diagnoses:  Exacerbation of intermittent asthma, unspecified asthma severity  Viral pharyngitis    New Prescriptions Discharge Medication List as of 04/15/2017  8:44 PM    START taking these medications   Details  benzonatate (TESSALON) 100 MG capsule Take 1 capsule (100 mg total) by mouth every 8 (eight) hours., Starting Tue 04/15/2017, Print    ondansetron (ZOFRAN ODT) 4 MG disintegrating tablet Take 1 tablet (4 mg total) by mouth every 8 (eight) hours as needed for nausea or vomiting., Starting Tue 04/15/2017, Print    predniSONE (DELTASONE) 50 MG tablet Take 1 tablet (50 mg total) by mouth daily., Starting Tue 04/15/2017, Print         Alfonzo Beers, MD 04/16/17 [phone]

## 2017-04-15 NOTE — Discharge Instructions (Signed)
Return to the ED with any concerns including difficulty breathing despite using albuterol every 4 hours, not drinking fluids, decreased urine output, vomiting and not able to keep down liquids or medications, decreased level of alertness/lethargy, or any other alarming symptoms °

## 2017-04-18 LAB — CULTURE, GROUP A STREP (THRC)

## 2017-05-14 ENCOUNTER — Encounter (HOSPITAL_COMMUNITY): Payer: Self-pay | Admitting: *Deleted

## 2017-05-27 ENCOUNTER — Other Ambulatory Visit: Payer: Self-pay | Admitting: Internal Medicine

## 2017-05-27 DIAGNOSIS — N61 Mastitis without abscess: Secondary | ICD-10-CM

## 2017-06-04 ENCOUNTER — Emergency Department (HOSPITAL_COMMUNITY)
Admission: EM | Admit: 2017-06-04 | Discharge: 2017-06-04 | Disposition: A | Discharge status: Home / Self Care | Payer: Medicaid Other | Attending: Emergency Medicine | Admitting: Emergency Medicine

## 2017-06-04 ENCOUNTER — Encounter (HOSPITAL_COMMUNITY): Payer: Self-pay

## 2017-06-04 DIAGNOSIS — N644 Mastodynia: Secondary | ICD-10-CM | POA: Diagnosis present

## 2017-06-04 DIAGNOSIS — Z79899 Other long term (current) drug therapy: Secondary | ICD-10-CM | POA: Insufficient documentation

## 2017-06-04 DIAGNOSIS — J45909 Unspecified asthma, uncomplicated: Secondary | ICD-10-CM | POA: Insufficient documentation

## 2017-06-04 DIAGNOSIS — F17210 Nicotine dependence, cigarettes, uncomplicated: Secondary | ICD-10-CM | POA: Insufficient documentation

## 2017-06-04 MED ORDER — MELOXICAM 15 MG PO TABS: 15.0000 mg | ORAL_TABLET | Freq: Every day | ORAL | 0 refills | Status: DC

## 2017-06-04 MED ORDER — OMEPRAZOLE 20 MG PO CPDR: 20.0000 mg | DELAYED_RELEASE_CAPSULE | Freq: Every day | ORAL | 0 refills | Status: DC

## 2017-06-04 MED ORDER — HYDROCODONE-ACETAMINOPHEN 5-325 MG PO TABS
1.0000 | ORAL_TABLET | Freq: Once | ORAL | Status: AC
  Administered 2017-06-04: 1 via ORAL
  Filled 2017-06-04: qty 1

## 2017-06-04 NOTE — ED Notes (Signed)
Discharge instructions reviewed with patient. Patient verbalizes understanding. VSS.   

## 2017-06-04 NOTE — ED Triage Notes (Addendum)
Patient arrives with c/o left breast pain x2 weeks. Patient has hx of left sided breast cancer. Patient states she has been in remission since April 2017. Patient states she saw her PCP for the pain one week ago, and was prescribed Keflex and Ibuprofen 800mg . Patient reports having taken 5days' worth of the keflex, but has experienced no relief. Patient called her PCP today, trying to get in, but states she was told her PCP was on vacation. Pt denies Nausea/vomiting/fever. Patient denies CP/SOB. Patient arrived during Epic downtime. See paper charting.

## 2017-06-04 NOTE — Discharge Instructions (Signed)
Please continue taking your Keflex. You need to have your breast imaging done. Please call and have the appointments for your breast imaging done as soon as possible. I would like you to follow up with oncology. I have provided you with a referral to the on call oncologist. If you develop chest pain, sob or new concerning symptoms you can return to the emergency department for re-evaluation. For pain control you may take: 1 Mobic daily (take with food) and acetaminophen 975mg  (this is 3 over the counter pills) four times a day. Do not drink alcohol or combine with other medications that have acetaminophen as an ingredient (Read the labels!). Do not combine Mobic with Ibuprofen.

## 2017-06-04 NOTE — ED Notes (Signed)
Bed: WTR9 Expected date:  Expected time:  Means of arrival:  Comments: Arac[place], Rickett

## 2017-06-04 NOTE — ED Provider Notes (Signed)
[facility] DEPT Provider Note   CSN: [id number] Arrival date & time: 06/04/17  1704   By signing my name below, I, Soijett Blue, attest that this documentation has been prepared under the direction and in the presence of Alferd Apa, [employer] Electronically Signed: Soijett Blue, ED Scribe. 06/04/17. 5:50 PM.  History   Chief Complaint Chief Complaint  Patient presents with  . Breast Pain    left    HPI Yolanda White is a 46 y.o. 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 Pt reports associated vomiting x this morning due to left breast pain. Pt has tried ibuprofen, keflex, and zofran with no relief of her symptoms. She notes that she has a hx of breast CA with a lumpectomy completed on December 2016 and radiation therapy completed April 2017. She reports that she opted out of chemotherapy. Denies having an oncologist and notes that she is no longer on Tamoxifen. Pt notes that her prior oncologist was Dr. Burr Medico. Pt reports that she currently has a lump to her left breast that she was evaluated by her PCP 2 weeks ago and prescribed keflex and ibuprofen. Pt notes that she attempted to follow up with her PCP today with no luck with an appointment. Pt left breast pain is exacerbated with palpation of the affected area. She denies fever, chills, color change, wound, and any other symptoms.     Per pt chart review: Pt was seen at [facility] with Dr. 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 During that visit, the pt was informed to follow up in 3 months in the clinic and had an ultrasound of her breasts ordered.     The history is provided by the patient. No language interpreter was used.    Past Medical History:  Diagnosis Date  . Anxiety   . Asthma   . Breast cancer ([place])     . Breast cancer of upper-inner quadrant of left female breast ([place]) 09/08/2015  . 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 Depression   . Myalgia   . Seasonal allergies     Patient Active Problem List   Diagnosis Date Noted  . Depression 07/09/2016  . Back pain 05/06/2016  . Asthma 02/06/2016  . Asthma exacerbation 02/06/2016  . Emesis   . Morbid obesity ([place])   . Genetic testing 09/26/2015  . Cigarette smoker 09/21/2015  . Severe obesity (BMI >= 40) ([place]) 09/21/2015  . Dyspnea 09/20/2015  . Breast cancer of upper-inner quadrant of left female breast ([place]) 09/08/2015    Past Surgical History:  Procedure Laterality Date  . ABDOMINAL HYSTERECTOMY    . RADIOACTIVE SEED GUIDED MASTECTOMY WITH AXILLARY SENTINEL LYMPH NODE BIOPSY Left 10/11/2015   Procedure: RADIOACTIVE SEED LOCALIZATION LEFT BREAST LUMPECTOMY WITH LEFT  AXILLARY SENTINEL LYMPH NODE BIOPSY;  Surgeon: Excell Seltzer, MD;  Location: [facility];  Service: General;  Laterality: Left;  . TUBAL LIGATION      OB History    Gravida Para Term Preterm AB Living   4 3 3   1 3    SAB TAB Ectopic Multiple Live Births                   Home Medications  Prior to Admission medications   Medication Sig Start Date End Date Taking? Authorizing Provider  albuterol (PROVENTIL) (2.5 MG/3ML) 0.083% nebulizer solution USE 1 VIAL BY MOUTH EVERY 6 HOURS AS NEEDED FOR SOB AND WHEEZING 02/06/17   [provider]  albuterol (VENTOLIN HFA) 108 (90 BASE) MCG/ACT inhaler Inhale 2 puffs into the lungs every 6 (six) hours as needed for wheezing or shortness of breath. Reported on 11/16/2015    [provider]  benzonatate (TESSALON) 100 MG capsule Take 1 capsule (100 mg total) by mouth every 8 (eight) hours. 04/15/17   Mabe, Forbes Cellar, MD  Camphor-Eucalyptus-Menthol (VICKS VAPORUB EX) Apply 1 application topically 2 (two) times daily as needed (cold symptoms).    [provider]  cyclobenzaprine (FLEXERIL) 10 MG tablet Take 10 mg by mouth 3 (three) times daily as needed for muscle spasms.    [provider]  dicyclomine (BENTYL) 20 MG tablet Take 1 tablet (20 mg total) by mouth 2 (two) times daily as needed for spasms (abdominal cramping). Patient not taking: Reported on 04/15/2017 05/05/16   Little, Wenda Overland, MD  gabapentin (NEURONTIN) 100 MG capsule Take 1 capsule (100 mg total) by mouth at bedtime. 10/31/16   Truitt Merle, MD  meloxicam (MOBIC) 15 MG tablet Take 1 tablet (15 mg total) by mouth daily. 06/04/17   Flynn Gwyn, Barth Kirks, PA-C  omeprazole (PRILOSEC) 20 MG capsule Take 1 capsule (20 mg total) by mouth daily. 06/04/17   Shriya Aker, Barth Kirks, PA-C  ondansetron (ZOFRAN ODT) 4 MG disintegrating tablet Take 1 tablet (4 mg total) by mouth every 8 (eight) hours as needed for nausea or vomiting. 04/15/17   Mabe, Forbes Cellar, MD  ondansetron (ZOFRAN) 4 MG tablet Take 1 tablet (4 mg total) by mouth every 8 (eight) hours as needed for nausea or vomiting. Patient not taking: Reported on 09/16/2016 09/13/16   Duffy Bruce, MD  predniSONE (DELTASONE) 50 MG tablet Take 1 tablet (50 mg total) by mouth daily. 04/15/17   Mabe, Forbes Cellar, MD  tamoxifen (NOLVADEX) 20 MG tablet Take 1 tablet (20 mg total) by mouth daily. Patient not taking: Reported on 04/15/2017 05/06/16   Truitt Merle, MD  venlafaxine XR (EFFEXOR-XR) 75 MG 24 hr capsule Take 1 capsule (75 mg total) by mouth daily with breakfast. Patient not taking: Reported on 04/15/2017 05/06/16   Truitt Merle, MD    Family History Family History  Problem Relation Age of Onset  . Diabetes Mother   . Diabetes Father   . Lung cancer Father 53       smoker  . Cancer Paternal Aunt        cervical  . Diabetes Brother   . Heart attack Paternal Grandmother        in her 33s  . Diabetes Brother        maternal half brother  . Asthma Brother   . Asthma Maternal Aunt     Social History Social History  Substance Use  Topics  . Smoking status: Current Every Day Smoker    Packs/day: 0.50    Years: 34.00    Types: Cigarettes  . Smokeless tobacco: Never Used  . Alcohol use No     Allergies   Patient has no known allergies.   Review of Systems Review of Systems  Constitutional: Negative for chills and fever.  Skin: Negative for color change and wound.       +left breast pain with a lump noted  Physical Exam Updated Vital Signs BP 118/80 (BP Location: Right Arm)   Pulse 75   Temp 97.9 F (36.6 C) (Oral)   Resp 18   Wt 210 lb (95.3 kg)   SpO2 100%   BMI 37.80 kg/m   Physical Exam  Constitutional: She appears well-developed and well-nourished.  HENT:  Head: Normocephalic and atraumatic.  Right Ear: External ear normal.  Left Ear: External ear normal.  Nose: Nose normal.  Mouth/Throat: Oropharynx is clear and moist.  Eyes: Pupils are equal, round, and reactive to light. Right eye exhibits no discharge. Left eye exhibits no discharge. No scleral icterus.  Neck: Neck supple.  Cardiovascular: Normal rate, regular rhythm and intact distal pulses.   No murmur heard. Pulses:      Radial pulses are 2+ on the right side, and 2+ on the left side.       Dorsalis pedis pulses are 2+ on the right side, and 2+ on the left side.       Posterior tibial pulses are 2+ on the right side, and 2+ on the left side.  No lower extremity swelling or edema. Calves symmetric in size bilaterally.  Pulmonary/Chest: Effort normal and breath sounds normal. She exhibits no tenderness.  Scribe chaperone present for exam. Appearance is normal. Upper area of breast with firmness that is mildly mobile and tender to palpation. No swelling. No superficial erythema, heat, ecchymosis, or ulceration. No peau d'orange. No discharge. Nipple and areolar tissue appears symmetric bilaterally without change. No retraction. Axillary LAD? Symmetric b/l.   Abdominal: Soft. Bowel sounds are normal. There is no tenderness. There is  no rebound and no guarding.  Musculoskeletal: She exhibits no edema.  Lymphadenopathy:    She has no cervical adenopathy.  Neurological: She is alert.  Skin: Skin is warm and dry. No rash noted. She is not diaphoretic.  Psychiatric: She has a normal mood and affect.  Nursing note and vitals reviewed.    ED Treatments / Results  DIAGNOSTIC STUDIES: Oxygen Saturation is 100% on RA, nl by my interpretation.    COORDINATION OF CARE: 5:49 PM Discussed treatment plan with pt at bedside and pt agreed to plan.   Labs (all labs ordered are listed, but only abnormal results are displayed) Labs Reviewed - No data to display  EKG  EKG Interpretation None       Radiology No results found.  Procedures Procedures (including critical care time)  Medications Ordered in ED Medications  HYDROcodone-acetaminophen (NORCO/VICODIN) 5-325 MG per tablet 1 tablet (1 tablet Oral Given 06/04/17 1821)     Initial Impression / Assessment and Plan / ED Course  I have reviewed the triage vital signs and the nursing notes.  Pertinent labs & imaging results that were available during my care of the patient were reviewed by me and considered in my medical decision making (see chart for details).     46 year old female presents to the ED with left breast pain. Pt has a known hx of malignant neoplasm of UIQ, left breast that was treated with radiation therapy in 2017. Patient is afebrile on presentation. Exam not concerning for infection. Patient recently seen by PCP for same issue and orders placed for [place], and Mammography of the breast. Advised the patient these are the correct diagnostic test that need to be performed and she will need to call to have them scheduled. Patients pain managed in the ED. Pt advised to follow up with Medical Oncology for further evaluation. Pt  will be discharged home with mobic and omeprazole Rx. Pt given omeprazole Rx due to her increased use of NSAIDs. Given strict return  precautions in the event new or worsening symptoms present. Pt verbalized understanding and agreement to today's plan and had no further questions or concerns at this time.  Patient case discussed with Dr. Tomi Bamberger who is in agreement with plan.  Final Clinical Impressions(s) / ED Diagnoses   Final diagnoses:  Breast pain, left    New Prescriptions Discharge Medication List as of 06/04/2017  5:58 PM    START taking these medications   Details  meloxicam (MOBIC) 15 MG tablet Take 1 tablet (15 mg total) by mouth daily., Starting Wed 06/04/2017, Print    omeprazole (PRILOSEC) 20 MG capsule Take 1 capsule (20 mg total) by mouth daily., Starting Wed 06/04/2017, Print       I personally performed the services described in this documentation, which was scribed in my presence. The recorded information has been reviewed and is accurate.       Lorelle Gibbs 06/05/17 2800    Dorie Rank, MD 06/05/17 1430

## 2017-06-05 ENCOUNTER — Telehealth: Payer: Self-pay | Admitting: Hematology

## 2017-06-05 NOTE — Telephone Encounter (Signed)
Pt was in emergency room last night and was referred to see Dr. Irene Limbo per the emergency room. Gave her calendar with appointments.

## 2017-06-09 ENCOUNTER — Ambulatory Visit (HOSPITAL_BASED_OUTPATIENT_CLINIC_OR_DEPARTMENT_OTHER): Payer: Medicaid Other | Admitting: Hematology

## 2017-06-09 ENCOUNTER — Encounter: Payer: Self-pay | Admitting: Hematology

## 2017-06-09 VITALS — BP 118/90 | HR 75 | Temp 98.8°F | Resp 20 | Ht 62.5 in | Wt 228.8 lb

## 2017-06-09 DIAGNOSIS — C50212 Malignant neoplasm of upper-inner quadrant of left female breast: Secondary | ICD-10-CM | POA: Diagnosis not present

## 2017-06-09 DIAGNOSIS — F418 Other specified anxiety disorders: Secondary | ICD-10-CM | POA: Diagnosis not present

## 2017-06-09 DIAGNOSIS — Z170 Estrogen receptor positive status [ER+]: Secondary | ICD-10-CM | POA: Diagnosis not present

## 2017-06-09 DIAGNOSIS — M797 Fibromyalgia: Secondary | ICD-10-CM

## 2017-06-09 DIAGNOSIS — Z720 Tobacco use: Secondary | ICD-10-CM | POA: Diagnosis not present

## 2017-06-09 DIAGNOSIS — F32A Depression, unspecified: Secondary | ICD-10-CM

## 2017-06-09 DIAGNOSIS — N644 Mastodynia: Secondary | ICD-10-CM

## 2017-06-09 DIAGNOSIS — F329 Major depressive disorder, single episode, unspecified: Secondary | ICD-10-CM

## 2017-06-09 DIAGNOSIS — M940 Chondrocostal junction syndrome [Tietze]: Secondary | ICD-10-CM

## 2017-06-09 NOTE — Patient Instructions (Signed)
Thank you for choosing [facility] to provide your oncology and hematology care.  To afford each patient quality time with our providers, please arrive 30 minutes before your scheduled appointment time.  If you arrive late for your appointment, you may be asked to reschedule.  We strive to give you quality time with our providers, and arriving late affects you and other patients whose appointments are after yours.   If you are a no show for multiple scheduled visits, you may be dismissed from the clinic at the providers discretion.    Again, thank you for choosing [facility], our hope is that these requests will decrease the amount of time that you wait before being seen by our physicians.  ______________________________________________________________________  Should you have questions after your visit to the [facility], please contact our office at [phone] between the hours of 8:30 and 4:30 p.m.    Voicemails left after 4:30p.m will not be returned until the following business day.    For prescription refill requests, please have your pharmacy contact us directly.  Please also try to allow 48 hours for prescription requests.    Please contact the scheduling department for questions regarding scheduling.  For scheduling of procedures such as PET scans, CT scans, MRI, Ultrasound, etc please contact central scheduling at [phone].    Resources For Cancer Patients and Caregivers:   Oncolink.org:  A wonderful resource for patients and healthcare providers for information regarding your disease, ways to tract your treatment, what to expect, etc.     American Cancer Society:  800-227-2345  Can help patients locate various types of support and financial assistance  Cancer Care: 1-800-813-HOPE (4673) Provides financial assistance, online support groups, medication/co-pay assistance.    [place] DSS:  336-641-3447 Where to apply for food  stamps, Medicaid, and utility assistance  Medicare Rights Center: 800-333-4114 Helps people with Medicare understand their rights and benefits, navigate the Medicare system, and secure the quality healthcare they deserve  SC[employer]: 336-333-6589 [place] [employer]'s shared-ride transportation service for eligible riders who have a disability that prevents them from riding the fixed route bus.    For additional information on assistance programs please contact our social worker:   Grier Hock/Abigail Elmore:  [phone]            

## 2017-06-10 ENCOUNTER — Ambulatory Visit
Admission: RE | Admit: 2017-06-10 | Discharge: 2017-06-10 | Disposition: A | Discharge status: Home / Self Care | Payer: Medicaid Other | Source: Ambulatory Visit | Attending: Internal Medicine | Admitting: Internal Medicine

## 2017-06-10 DIAGNOSIS — N61 Mastitis without abscess: Secondary | ICD-10-CM

## 2017-06-10 MED ORDER — VENLAFAXINE HCL ER 75 MG PO CP24: 75.0000 mg | ORAL_CAPSULE | Freq: Every day | ORAL | 1 refills | Status: DC

## 2017-06-10 MED ORDER — TAMOXIFEN CITRATE 20 MG PO TABS: 20.0000 mg | ORAL_TABLET | Freq: Every day | ORAL | 3 refills | Status: DC
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
## 2017-06-11 ENCOUNTER — Telehealth: Payer: Self-pay | Admitting: Hematology

## 2017-06-11 NOTE — Telephone Encounter (Signed)
Called patient regarding appointments for August also gave the phone# for Behavioral health

## 2017-06-16 ENCOUNTER — Other Ambulatory Visit (HOSPITAL_BASED_OUTPATIENT_CLINIC_OR_DEPARTMENT_OTHER): Payer: Medicaid Other

## 2017-06-16 DIAGNOSIS — C50212 Malignant neoplasm of upper-inner quadrant of left female breast: Secondary | ICD-10-CM

## 2017-06-16 DIAGNOSIS — Z170 Estrogen receptor positive status [ER+]: Principal | ICD-10-CM

## 2017-06-16 LAB — COMPREHENSIVE METABOLIC PANEL
ALT: 20 U/L (ref 0–55)
ANION GAP: 6 meq/L (ref 3–11)
AST: 15 U/L (ref 5–34)
Albumin: 3.6 g/dL (ref 3.5–5.0)
Alkaline Phosphatase: 133 U/L (ref 40–150)
BILIRUBIN TOTAL: 0.53 mg/dL (ref 0.20–1.20)
BUN: 8.3 mg/dL (ref 7.0–26.0)
CALCIUM: 9.4 mg/dL (ref 8.4–10.4)
CHLORIDE: 108 meq/L (ref 98–109)
CO2: 26 mEq/L (ref 22–29)
CREATININE: 1.2 mg/dL — AB (ref 0.6–1.1)
EGFR: 56 mL/min/{1.73_m2} — ABNORMAL LOW (ref >90)
Glucose: 102 mg/dl (ref 70–140)
Potassium: 4.3 mEq/L (ref 3.5–5.1)
Sodium: 140 mEq/L (ref 136–145)
TOTAL PROTEIN: 7.3 g/dL (ref 6.4–8.3)

## 2017-06-16 LAB — CBC & DIFF AND RETIC
BASO%: 0.7 % (ref 0.0–2.0)
BASOS ABS: 0.1 10*3/uL (ref 0.0–0.1)
EOS ABS: 0.5 10*3/uL (ref 0.0–0.5)
EOS%: 6.9 % (ref 0.0–7.0)
HEMATOCRIT: 39.2 % (ref 34.8–46.6)
HEMOGLOBIN: 13.6 g/dL (ref 11.6–15.9)
IMMATURE RETIC FRACT: 4 % (ref 1.60–10.00)
LYMPH#: 1.9 10*3/uL (ref 0.9–3.3)
LYMPH%: 28.6 % (ref 14.0–49.7)
MCH: 29.1 pg (ref 25.1–34.0)
MCHC: 34.7 g/dL (ref 31.5–36.0)
MCV: 83.9 fL (ref 79.5–101.0)
MONO#: 0.4 10*3/uL (ref 0.1–0.9)
MONO%: 6 % (ref 0.0–14.0)
NEUT#: 3.9 10*3/uL (ref 1.5–6.5)
NEUT%: 57.8 % (ref 38.4–76.8)
PLATELETS: 251 10*3/uL (ref 145–400)
RBC: 4.67 10*6/uL (ref 3.70–5.45)
RDW: 12.9 % (ref 11.2–14.5)
RETIC %: 1.48 % (ref 0.70–2.10)
RETIC CT ABS: 69.12 10*3/uL (ref 33.70–90.70)
WBC: 6.7 10*3/uL (ref 3.9–10.3)

## 2017-06-23 ENCOUNTER — Telehealth: Payer: Self-pay | Admitting: Hematology

## 2017-06-23 ENCOUNTER — Ambulatory Visit (HOSPITAL_BASED_OUTPATIENT_CLINIC_OR_DEPARTMENT_OTHER): Payer: Medicaid Other | Admitting: Hematology

## 2017-06-23 ENCOUNTER — Encounter: Payer: Self-pay | Admitting: Hematology

## 2017-06-23 VITALS — BP 120/92 | HR 85 | Temp 98.7°F | Resp 18 | Ht 62.5 in | Wt 223.0 lb

## 2017-06-23 DIAGNOSIS — M797 Fibromyalgia: Secondary | ICD-10-CM | POA: Diagnosis not present

## 2017-06-23 DIAGNOSIS — F419 Anxiety disorder, unspecified: Secondary | ICD-10-CM | POA: Diagnosis not present

## 2017-06-23 DIAGNOSIS — Z170 Estrogen receptor positive status [ER+]: Secondary | ICD-10-CM

## 2017-06-23 DIAGNOSIS — F17200 Nicotine dependence, unspecified, uncomplicated: Secondary | ICD-10-CM

## 2017-06-23 DIAGNOSIS — Z720 Tobacco use: Secondary | ICD-10-CM | POA: Diagnosis not present

## 2017-06-23 DIAGNOSIS — F329 Major depressive disorder, single episode, unspecified: Secondary | ICD-10-CM | POA: Diagnosis not present

## 2017-06-23 DIAGNOSIS — C50212 Malignant neoplasm of upper-inner quadrant of left female breast: Secondary | ICD-10-CM

## 2017-06-23 DIAGNOSIS — N951 Menopausal and female climacteric states: Secondary | ICD-10-CM

## 2017-06-23 NOTE — Telephone Encounter (Signed)
Gave patient avs and calendar with upcoming appts.  °
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
## 2017-07-03 ENCOUNTER — Encounter (HOSPITAL_COMMUNITY): Payer: Self-pay | Admitting: *Deleted

## 2017-07-03 DIAGNOSIS — K5901 Slow transit constipation: Secondary | ICD-10-CM | POA: Diagnosis not present

## 2017-07-03 DIAGNOSIS — J45909 Unspecified asthma, uncomplicated: Secondary | ICD-10-CM | POA: Insufficient documentation

## 2017-07-03 DIAGNOSIS — K640 First degree hemorrhoids: Secondary | ICD-10-CM | POA: Insufficient documentation

## 2017-07-03 DIAGNOSIS — M545 Low back pain: Secondary | ICD-10-CM | POA: Diagnosis present

## 2017-07-03 DIAGNOSIS — Z79899 Other long term (current) drug therapy: Secondary | ICD-10-CM | POA: Insufficient documentation

## 2017-07-03 DIAGNOSIS — F17210 Nicotine dependence, cigarettes, uncomplicated: Secondary | ICD-10-CM | POA: Diagnosis not present

## 2017-07-03 DIAGNOSIS — G8929 Other chronic pain: Secondary | ICD-10-CM | POA: Diagnosis not present

## 2017-07-03 DIAGNOSIS — Z7983 Long term (current) use of bisphosphonates: Secondary | ICD-10-CM | POA: Diagnosis not present

## 2017-07-03 NOTE — ED Triage Notes (Signed)
Pt last normal blood pressure was 8/26; took a stool softener on Tuesday of this week and reports having increased lower back pain, worsening when trying to use the bathroom. Denies urinary symptoms

## 2017-07-04 ENCOUNTER — Emergency Department (HOSPITAL_COMMUNITY)
Admission: EM | Admit: 2017-07-04 | Discharge: 2017-07-04 | Disposition: A | Discharge status: Home / Self Care | Payer: Medicaid Other | Attending: Emergency Medicine | Admitting: Emergency Medicine

## 2017-07-04 DIAGNOSIS — K5901 Slow transit constipation: Secondary | ICD-10-CM

## 2017-07-04 DIAGNOSIS — K640 First degree hemorrhoids: Secondary | ICD-10-CM

## 2017-07-04 DIAGNOSIS — G8929 Other chronic pain: Secondary | ICD-10-CM

## 2017-07-04 DIAGNOSIS — M545 Low back pain: Secondary | ICD-10-CM

## 2017-07-04 LAB — URINALYSIS, ROUTINE W REFLEX MICROSCOPIC
Bilirubin Urine: NEGATIVE
Glucose, UA: NEGATIVE mg/dL
Hgb urine dipstick: NEGATIVE
Ketones, ur: NEGATIVE mg/dL
Leukocytes, UA: NEGATIVE
Nitrite: NEGATIVE
Protein, ur: NEGATIVE mg/dL
Specific Gravity, Urine: 1.012 (ref 1.005–1.030)
pH: 6 (ref 5.0–8.0)

## 2017-07-04 LAB — COMPREHENSIVE METABOLIC PANEL
ALBUMIN: 3.6 g/dL (ref 3.5–5.0)
ALT: 22 U/L (ref 14–54)
AST: 20 U/L (ref 15–41)
Alkaline Phosphatase: 110 U/L (ref 38–126)
Anion gap: 9 (ref 5–15)
BILIRUBIN TOTAL: 0.9 mg/dL (ref 0.3–1.2)
BUN: 9 mg/dL (ref 6–20)
CHLORIDE: 106 mmol/L (ref 101–111)
CO2: 24 mmol/L (ref 22–32)
Calcium: 9 mg/dL (ref 8.9–10.3)
Creatinine, Ser: 1.13 mg/dL — ABNORMAL HIGH (ref 0.44–1.00)
GFR calc Af Amer: >60 mL/min (ref >60)
GFR calc non Af Amer: 58 mL/min — ABNORMAL LOW (ref >60)
GLUCOSE: 93 mg/dL (ref 65–99)
POTASSIUM: 4.1 mmol/L (ref 3.5–5.1)
Sodium: 139 mmol/L (ref 135–145)
Total Protein: 7.2 g/dL (ref 6.5–8.1)

## 2017-07-04 LAB — CBC
HCT: 38.5 % (ref 36.0–46.0)
Hemoglobin: 12.8 g/dL (ref 12.0–15.0)
MCH: 27.8 pg (ref 26.0–34.0)
MCHC: 33.2 g/dL (ref 30.0–36.0)
MCV: 83.5 fL (ref 78.0–100.0)
Platelets: 252 K/uL (ref 150–400)
RBC: 4.61 MIL/uL (ref 3.87–5.11)
RDW: 12.9 % (ref 11.5–15.5)
WBC: 9.1 K/uL (ref 4.0–10.5)

## 2017-07-04 MED ORDER — HYDROCORTISONE 1 % EX CREA: TOPICAL_CREAM | CUTANEOUS | 0 refills | Status: DC

## 2017-07-04 MED ORDER — HYDROCODONE-ACETAMINOPHEN 5-325 MG PO TABS
2.0000 | ORAL_TABLET | Freq: Once | ORAL | Status: AC
  Administered 2017-07-04: 2 via ORAL
  Filled 2017-07-04: qty 2

## 2017-07-04 NOTE — ED Notes (Signed)
ED Provider at bedside. 

## 2017-07-04 NOTE — ED Provider Notes (Signed)
[facility] DEPT Provider Note   CSN: [id number] Arrival date & time: 07/03/17  2305     History   Chief Complaint Chief Complaint  Patient presents with  . Back Pain  . Constipation    HPI Yolanda White is a 46 y.o. female.  The history is provided by the patient.  Back Pain   This is a chronic problem. The current episode started more than 1 week ago. The problem occurs daily. The problem has been gradually worsening. The pain is associated with no known injury. The pain is moderate. Pertinent negatives include no fever, no abdominal pain and no weakness.  Constipation   This is a new problem. The current episode started more than 1 week ago. Pertinent negatives include no abdominal pain.  pt reports constipation for over a week She reports she has had only small amt of stool No fever/vomiting No new abd pain She reports "knot" at her rectum that is painful She also reports worsening chronic back pain, reports walking and sitting on toilet have made it worse    Past Medical History:  Diagnosis Date  . Anxiety   . Asthma   . Breast cancer ([place])   . Breast cancer of upper-inner quadrant of left female breast ([place]) 09/08/2015  . 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 Depression   . Myalgia   . Seasonal allergies     Patient Active Problem List   Diagnosis Date Noted  . Depression 07/09/2016  . Back pain 05/06/2016  . Asthma 02/06/2016  . Asthma exacerbation 02/06/2016  . Emesis   . Morbid obesity ([place])   . Genetic testing 09/26/2015  . Cigarette smoker 09/21/2015  . Severe obesity (BMI >= 40) ([place]) 09/21/2015  . Dyspnea 09/20/2015  . Breast cancer of upper-inner quadrant of left female breast ([place]) 09/08/2015    Past Surgical History:  Procedure Laterality Date  . ABDOMINAL HYSTERECTOMY    . BREAST LUMPECTOMY Left 09/2015  . RADIOACTIVE SEED GUIDED MASTECTOMY WITH AXILLARY SENTINEL LYMPH NODE BIOPSY Left 10/11/2015   Procedure: RADIOACTIVE SEED LOCALIZATION LEFT BREAST LUMPECTOMY WITH LEFT  AXILLARY SENTINEL LYMPH NODE BIOPSY;  Surgeon: Excell Seltzer, MD;  Location: [facility];  Service: General;  Laterality: Left;  . TUBAL LIGATION      OB History    Gravida Para Term Preterm AB Living   4 3 3   1 3    SAB TAB Ectopic Multiple Live Births                   Home Medications    Prior to Admission medications   Medication Sig Start Date End Date Taking? Authorizing Provider  albuterol (PROVENTIL) (2.5 MG/3ML) 0.083% nebulizer solution USE 1 VIAL BY MOUTH EVERY 6 HOURS AS NEEDED FOR SOB AND WHEEZING 02/06/17   [provider]  albuterol (VENTOLIN HFA) 108 (90 BASE) MCG/ACT inhaler Inhale 2 puffs into the lungs every 6 (six) hours as needed for wheezing or shortness of breath. Reported on 11/16/2015    [provider]  benzonatate (TESSALON) 100 MG capsule Take 1 capsule (100 mg total) by mouth every 8 (eight) hours. 04/15/17   Mabe, Forbes Cellar, MD  Camphor-Eucalyptus-Menthol (VICKS VAPORUB EX) Apply 1 application topically 2 (two) times daily as needed (cold symptoms).    [provider]  cyclobenzaprine (FLEXERIL) 10 MG tablet Take 10 mg by mouth 3 (three) times daily as needed for muscle  spasms.    [provider]  dicyclomine (BENTYL) 20 MG tablet Take 1 tablet (20 mg total) by mouth 2 (two) times daily as needed for spasms (abdominal cramping). 05/05/16   Little, Wenda Overland, MD  gabapentin (NEURONTIN) 100 MG capsule Take 1 capsule (100 mg total) by mouth at bedtime. 10/31/16   Truitt Merle, MD  meloxicam (MOBIC) 15 MG tablet Take 1 tablet (15 mg total) by mouth daily. 06/04/17   Maczis, Barth Kirks, PA-C  omeprazole (PRILOSEC) 20 MG capsule Take 1 capsule (20 mg total) by mouth daily. 06/04/17   Maczis, Barth Kirks, PA-C  ondansetron (ZOFRAN ODT) 4 MG disintegrating tablet Take 1 tablet (4 mg total) by mouth every 8 (eight) hours as needed for nausea or vomiting.  04/15/17   Mabe, Forbes Cellar, MD  tamoxifen (NOLVADEX) 20 MG tablet Take 1 tablet (20 mg total) by mouth daily. 06/10/17   Brunetta Genera, MD  venlafaxine XR (EFFEXOR-XR) 75 MG 24 hr capsule Take 1 capsule (75 mg total) by mouth daily with breakfast. 06/10/17   Brunetta Genera, MD    Family History Family History  Problem Relation Age of Onset  . Diabetes Mother   . Diabetes Father   . Lung cancer Father 70       smoker  . Cancer Paternal Aunt        cervical  . Diabetes Brother   . Heart attack Paternal Grandmother        in her 49s  . Diabetes Brother        maternal half brother  . Asthma Brother   . Asthma Maternal Aunt     Social History Social History  Substance Use Topics  . Smoking status: Current Every Day Smoker    Packs/day: 0.50    Years: 34.00    Types: Cigarettes  . Smokeless tobacco: Never Used  . Alcohol use No     Allergies   Patient has no known allergies.   Review of Systems Review of Systems  Constitutional: Negative for fever.  Gastrointestinal: Positive for constipation and rectal pain. Negative for abdominal pain and vomiting.  Musculoskeletal: Positive for back pain.  Neurological: Negative for weakness.  All other systems reviewed and are negative.    Physical Exam Updated Vital Signs BP 105/85   Pulse 73   Temp 98.1 F (36.7 C) (Oral)   Resp 20   SpO2 100%   Physical Exam CONSTITUTIONAL: anxious, mildly disheveled HEAD: Normocephalic/atraumatic EYES: EOMI/PERRL ENMT: Mucous membranes moist NECK: supple no meningeal signs SPINE/BACK:diffuse lumbar spinal/paraspinal tenderness CV: S1/S2 noted, no murmurs/rubs/gallops noted LUNGS: Lungs are clear to auscultation bilaterally, no apparent distress ABDOMEN: soft, nontender, +BS noted Rectal - external hemorrhoid noted, non-thrombosed.  No abscess/erythema noted.  No stool impaction noted.  Nurse present for exam GU:no cva tenderness NEURO: Pt is awake/alert/appropriate,  moves all extremitiesx4. No focal weakness noted in extremities EXTREMITIES: pulses normal/equal, full ROM SKIN: warm, color normal PSYCH: anxious  ED Treatments / Results  Labs (all labs ordered are listed, but only abnormal results are displayed) Labs Reviewed  COMPREHENSIVE METABOLIC PANEL - Abnormal; Notable for the following:       Result Value   Creatinine, Ser 1.13 (*)    GFR calc non Af Amer 58 (*)    All other components within normal limits  URINALYSIS, ROUTINE W REFLEX MICROSCOPIC - Abnormal; Notable for the following:    APPearance HAZY (*)    All other components within normal limits  CBC    EKG  EKG Interpretation None       Radiology No results found.  Procedures Procedures    Medications Ordered in ED Medications  HYDROcodone-acetaminophen (NORCO/VICODIN) 5-325 MG per tablet 2 tablet (2 tablets Oral Given 07/04/17 0352)     Initial Impression / Assessment and Plan / ED Course  I have reviewed the triage vital signs and the nursing notes.  Pertinent labs   results that were available during my care of the patient were reviewed by me and considered in my medical decision making (see chart for details).     Pt improved No distress No abd tenderness/distention, doubt obstruction Pt with chronic back pain, worse since sitting on toilet, no new leg weakness or urinary incontinence As for constipation, advised use of fiber Given meds for hemorrhoids We discussed return precautions   Final Clinical Impressions(s) / ED Diagnoses   Final diagnoses:  Chronic midline low back pain without sciatica  Slow transit constipation  Grade I hemorrhoids    New Prescriptions New Prescriptions   HYDROCORTISONE CREAM 1 %    Apply to affected area 2 times daily     Ripley Fraise, MD 07/04/17 [phone]

## 2017-09-13 ENCOUNTER — Emergency Department (HOSPITAL_COMMUNITY): Payer: Medicaid Other

## 2017-09-13 ENCOUNTER — Emergency Department (HOSPITAL_COMMUNITY)
Admission: EM | Admit: 2017-09-13 | Discharge: 2017-09-14 | Disposition: A | Discharge status: Home / Self Care | Payer: Medicaid Other | Attending: Physician Assistant | Admitting: Physician Assistant

## 2017-09-13 ENCOUNTER — Encounter (HOSPITAL_COMMUNITY): Payer: Self-pay | Admitting: Emergency Medicine

## 2017-09-13 DIAGNOSIS — Z79899 Other long term (current) drug therapy: Secondary | ICD-10-CM | POA: Diagnosis not present

## 2017-09-13 DIAGNOSIS — F17210 Nicotine dependence, cigarettes, uncomplicated: Secondary | ICD-10-CM | POA: Diagnosis not present

## 2017-09-13 DIAGNOSIS — Z853 Personal history of malignant neoplasm of breast: Secondary | ICD-10-CM | POA: Insufficient documentation

## 2017-09-13 DIAGNOSIS — J45909 Unspecified asthma, uncomplicated: Secondary | ICD-10-CM | POA: Diagnosis not present

## 2017-09-13 DIAGNOSIS — M25531 Pain in right wrist: Secondary | ICD-10-CM | POA: Diagnosis present

## 2017-09-13 DIAGNOSIS — R202 Paresthesia of skin: Secondary | ICD-10-CM | POA: Insufficient documentation

## 2017-09-13 MED ORDER — IBUPROFEN 400 MG PO TABS
600.0000 mg | ORAL_TABLET | Freq: Once | ORAL | Status: AC
  Administered 2017-09-13: 21:00:00 600 mg via ORAL
  Filled 2017-09-13: qty 1

## 2017-09-13 NOTE — ED Provider Notes (Signed)
[facility] EMERGENCY DEPARTMENT Provider Note   CSN: [id number] Arrival date & time: 09/13/17  2043     History   Chief Complaint Chief Complaint  Patient presents with  . Wrist Pain    HPI TIJA BISS is a 46 y.o. female.  HPI   Ms. Neuwirth is a 46 year old female with a history of carpal tunnel, breast cancer (mastectomy 2016), asthma, tobacco use, depression, anxiety, allergies who presents emergency department for evaluation of right posterior forearm swelling and pain.  States that she noticed a "knot" over her posterior right forearm yesterday.  Denies inciting injury, trauma or bug bite.  States that today she has 10/10 severity pain over the knot and also over the thenar eminence which feels "tight."  Pain is worsened with palpating over the posterior forearm. She states that she also has a tingling sensation in the second, third and fourth digit of the right hand which is intermittent.  She states that she has had these symptoms in the past with her carpal tunnel.  Repeatedly states that "I just need to pop my fingers."  She has tried taking Nabumetone which is an NSAID prescribed by her PCP, without significant relief.  She denies fever, numbness, weakness, erythema, wound.  Past Medical History:  Diagnosis Date  . Anxiety   . Asthma   . Breast cancer ([place])   . Breast cancer of upper-inner quadrant of left female breast ([place]) 09/08/2015  . 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 Depression   . Myalgia   . Seasonal allergies     Patient Active Problem List   Diagnosis Date Noted  . Depression 07/09/2016  . Back pain 05/06/2016  . Asthma 02/06/2016  . Asthma exacerbation 02/06/2016  . Emesis   . Morbid obesity ([place])   . Genetic testing 09/26/2015  . Cigarette smoker 09/21/2015  . Severe obesity (BMI >= 40) ([place]) 09/21/2015  . Dyspnea 09/20/2015  . Breast cancer of upper-inner quadrant of left female breast  ([place]) 09/08/2015    Past Surgical History:  Procedure Laterality Date  . ABDOMINAL HYSTERECTOMY    . BREAST LUMPECTOMY Left 09/2015  . RADIOACTIVE SEED LOCALIZATION LEFT BREAST LUMPECTOMY WITH LEFT  AXILLARY SENTINEL LYMPH NODE BIOPSY Left 10/11/2015   Performed by Excell Seltzer, MD at [facility]  . TUBAL LIGATION      OB History    Gravida Para Term Preterm AB Living   4 3 3   1 3    SAB TAB Ectopic Multiple Live Births                   Home Medications    Prior to Admission medications   Medication Sig Start Date End Date Taking? Authorizing Provider  albuterol (PROVENTIL) (2.5 MG/3ML) 0.083% nebulizer solution USE 1 VIAL BY MOUTH EVERY 6 HOURS AS NEEDED FOR SOB AND WHEEZING 02/06/17   [provider]  albuterol (VENTOLIN HFA) 108 (90 BASE) MCG/ACT inhaler Inhale 2 puffs into the lungs every 6 (six) hours as needed for wheezing or shortness of breath. Reported on 11/16/2015    [provider]  cyclobenzaprine (FLEXERIL) 10 MG tablet Take 10 mg by mouth 3 (three) times daily as needed for muscle spasms.    [provider]  dicyclomine (BENTYL) 20 MG tablet Take 1 tablet (20 mg total) by mouth 2 (two) times daily as needed for spasms (abdominal cramping). 05/05/16  Little, Wenda Overland, MD  gabapentin (NEURONTIN) 100 MG capsule Take 1 capsule (100 mg total) by mouth at bedtime. 10/31/16   Truitt Merle, MD  hydrocortisone cream 1 % Apply to affected area 2 times daily 07/04/17   Ripley Fraise, MD  meloxicam (MOBIC) 15 MG tablet Take 1 tablet (15 mg total) by mouth daily. 06/04/17   Maczis, Barth Kirks, PA-C  omeprazole (PRILOSEC) 20 MG capsule Take 1 capsule (20 mg total) by mouth daily. 06/04/17   Maczis, Barth Kirks, PA-C  ondansetron (ZOFRAN ODT) 4 MG disintegrating tablet Take 1 tablet (4 mg total) by mouth every 8 (eight) hours as needed for nausea or vomiting. 04/15/17   Mabe, Forbes Cellar, MD  tamoxifen (NOLVADEX) 20 MG tablet Take 1 tablet (20 mg  total) by mouth daily. 06/10/17   Brunetta Genera, MD  venlafaxine XR (EFFEXOR-XR) 75 MG 24 hr capsule Take 1 capsule (75 mg total) by mouth daily with breakfast. 06/10/17   Brunetta Genera, MD    Family History Family History  Problem Relation Age of Onset  . Diabetes Mother   . Diabetes Father   . Lung cancer Father 70       smoker  . Cancer Paternal Aunt        cervical  . Diabetes Brother   . Heart attack Paternal Grandmother        in her 73s  . Diabetes Brother        maternal half brother  . Asthma Brother   . Asthma Maternal Aunt     Social History Social History   Tobacco Use  . Smoking status: Current Every Day Smoker    Packs/day: 0.50    Years: 34.00    Pack years: 17.00    Types: Cigarettes  . Smokeless tobacco: Never Used  Substance Use Topics  . Alcohol use: No    Alcohol/week: 0.0 oz  . Drug use: No     Allergies   Patient has no known allergies.   Review of Systems Review of Systems  Constitutional: Negative for chills, fatigue and fever.  Musculoskeletal: Positive for joint swelling (right posterior forearm) and myalgias (right posterior forearm pain). Negative for gait problem.  Skin: Negative for color change and wound.  Neurological: Negative for weakness and numbness (tingling sensation in digits 2-4 right hand).     Physical Exam Updated Vital Signs BP 138/72 (BP Location: Right Arm)   Pulse 86   Temp 98.2 F (36.8 C) (Oral)   Resp 18   Ht 5\' 2"  (1.575 m)   Wt 101.2 kg (223 lb)   SpO2 99%   BMI 40.79 kg/m   Physical Exam  Constitutional: She appears well-developed and well-nourished. No distress.  HENT:  Head: Normocephalic and atraumatic.  Eyes: Right eye exhibits no discharge. Left eye exhibits no discharge.  Pulmonary/Chest: Effort normal. No respiratory distress.  Musculoskeletal:  Right posterior distal forearm appears mildly swollen compared to left forearm.  It is mildly tender to palpation.  No overlying  erythema, warmth, induration, ecchymosis or wound. Full active ROM of the right wrist and elbow. Thenar eminence of the right hand mildly tender to palpation, no swelling, erythema, induration, warmth, ecchymosis. No swelling or erythema of the fingers. No tenderness over the fingers. No tenderness with passive extension of the fingers. Patient able to make a fist. Grip strength 5/5. Radial pulse 2+. Cap refill <2s in all five fingers of the left hand.   Neurological: She is alert. Coordination  normal.  Sensation to light/sharp touch intact in all 5 fingers of the right hand.   Skin: Skin is warm and dry. Capillary refill takes less than 2 seconds. She is not diaphoretic.  Psychiatric: She has a normal mood and affect. Her behavior is normal.  Nursing note and vitals reviewed.    ED Treatments / Results  Labs (all labs ordered are listed, but only abnormal results are displayed) Labs Reviewed - No data to display  EKG  EKG Interpretation None       Radiology No results found.  Procedures Procedures (including critical care time)  Medications Ordered in ED Medications - No data to display   Initial Impression / Assessment and Plan / ED Course  I have reviewed the triage vital signs and the nursing notes.  Pertinent labs & imaging results that were available during my care of the patient were reviewed by me and considered in my medical decision making (see chart for details).    X-rays of right hand and forearm reveal no acute fracture or soft tissue swelling.  Do not suspect flexor tenosynovitis given no pain with passive extension and no erythema, edema of the fingers or palmar aspect of the hand. Right hand is neurovascularly intact. Suspect that she has had worsening of her carpal tunnel given tingling sensation in the 2nd-4th digits and report of similar symptoms in the past when her carpal tunnel was "acting up."  Discussed NSAID use for pain.  Patient requesting hand brace.  Will send her home with this for stability. Have counseled patient to follow-up with her primary care provider if her symptoms persist. Discussed return precautions and patient agrees and voiced understanding to the plan.  Final Clinical Impressions(s) / ED Diagnoses   Final diagnoses:  Right wrist pain    ED Discharge Orders    None       Bernarda Caffey 09/14/17 1333    Mackuen, Fredia Sorrow, MD 09/14/17 2201

## 2017-09-13 NOTE — ED Notes (Signed)
Called radiology again about patients xray

## 2017-09-13 NOTE — ED Notes (Signed)
Portable xray here.

## 2017-09-13 NOTE — Discharge Instructions (Signed)
X-ray of right hand and forearm show no fracture, dislocation or acute abnormality.    No signs of infection.  Your symptoms are likely related to carpal tunnel, as you had in the past.  Please schedule an appointment with your primary doctor for further evaluation if your symptoms persist.  You may take 600 mg ibuprofen every 6 hours as needed for pain and inflammation.  You can also apply ice to help with swelling and elevate the arm as well.  Return to the emergency department if you develop redness in the hand or over the area of swelling, fever greater than 100.4 F or have a new injury.  Please also return for any new or worsening symptoms.

## 2017-09-13 NOTE — ED Notes (Signed)
Called radiology to check status. Assured that she would be taken soon

## 2017-09-13 NOTE — ED Triage Notes (Signed)
Reports having pain in right wrist going into right hand that started last night.  Unsure of any injury.

## 2017-09-23 ENCOUNTER — Ambulatory Visit (HOSPITAL_BASED_OUTPATIENT_CLINIC_OR_DEPARTMENT_OTHER): Payer: Medicaid Other | Admitting: Hematology

## 2017-09-23 ENCOUNTER — Other Ambulatory Visit: Payer: Medicaid Other

## 2017-09-23 ENCOUNTER — Other Ambulatory Visit: Payer: Self-pay | Admitting: Hematology

## 2017-09-23 ENCOUNTER — Other Ambulatory Visit (HOSPITAL_BASED_OUTPATIENT_CLINIC_OR_DEPARTMENT_OTHER): Payer: Medicaid Other

## 2017-09-23 ENCOUNTER — Ambulatory Visit: Payer: Medicaid Other | Admitting: Hematology

## 2017-09-23 ENCOUNTER — Telehealth: Payer: Self-pay | Admitting: Hematology

## 2017-09-23 VITALS — BP 116/72 | HR 83 | Temp 98.0°F | Resp 18 | Ht 62.0 in | Wt 227.6 lb

## 2017-09-23 DIAGNOSIS — Z170 Estrogen receptor positive status [ER+]: Principal | ICD-10-CM

## 2017-09-23 DIAGNOSIS — C50212 Malignant neoplasm of upper-inner quadrant of left female breast: Secondary | ICD-10-CM

## 2017-09-23 DIAGNOSIS — M791 Myalgia, unspecified site: Secondary | ICD-10-CM | POA: Diagnosis not present

## 2017-09-23 DIAGNOSIS — F418 Other specified anxiety disorders: Secondary | ICD-10-CM

## 2017-09-23 LAB — COMPREHENSIVE METABOLIC PANEL
ALT: 18 U/L (ref 0–55)
ANION GAP: 9 meq/L (ref 3–11)
AST: 19 U/L (ref 5–34)
Albumin: 4 g/dL (ref 3.5–5.0)
Alkaline Phosphatase: 111 U/L (ref 40–150)
BILIRUBIN TOTAL: 0.96 mg/dL (ref 0.20–1.20)
BUN: 10.2 mg/dL (ref 7.0–26.0)
CALCIUM: 9.6 mg/dL (ref 8.4–10.4)
CO2: 24 meq/L (ref 22–29)
CREATININE: 1.2 mg/dL — AB (ref 0.6–1.1)
Chloride: 104 mEq/L (ref 98–109)
EGFR: 57 mL/min/{1.73_m2} — ABNORMAL LOW (ref >60)
Glucose: 81 mg/dl (ref 70–140)
Potassium: 4.4 mEq/L (ref 3.5–5.1)
Sodium: 138 mEq/L (ref 136–145)
TOTAL PROTEIN: 7.8 g/dL (ref 6.4–8.3)

## 2017-09-23 LAB — CBC & DIFF AND RETIC
BASO%: 0.5 % (ref 0.0–2.0)
BASOS ABS: 0 10*3/uL (ref 0.0–0.1)
EOS ABS: 0.3 10*3/uL (ref 0.0–0.5)
EOS%: 5.3 % (ref 0.0–7.0)
HEMATOCRIT: 37.9 % (ref 34.8–46.6)
HEMOGLOBIN: 12.9 g/dL (ref 11.6–15.9)
IMMATURE RETIC FRACT: 3.9 % (ref 1.60–10.00)
LYMPH%: 25.1 % (ref 14.0–49.7)
MCH: 28.7 pg (ref 25.1–34.0)
MCHC: 34 g/dL (ref 31.5–36.0)
MCV: 84.2 fL (ref 79.5–101.0)
MONO#: 0.3 10*3/uL (ref 0.1–0.9)
MONO%: 5.5 % (ref 0.0–14.0)
NEUT#: 3.7 10*3/uL (ref 1.5–6.5)
NEUT%: 63.6 % (ref 38.4–76.8)
PLATELETS: 222 10*3/uL (ref 145–400)
RBC: 4.5 10*6/uL (ref 3.70–5.45)
RDW: 12.8 % (ref 11.2–14.5)
RETIC CT ABS: 61.65 10*3/uL (ref 33.70–90.70)
Retic %: 1.37 % (ref 0.70–2.10)
WBC: 5.9 10*3/uL (ref 3.9–10.3)
lymph#: 1.5 10*3/uL (ref 0.9–3.3)

## 2017-09-23 MED ORDER — POLYETHYLENE GLYCOL 3350 17 G PO PACK: 17.0000 g | PACK | Freq: Every day | ORAL | 1 refills | Status: DC
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
## 2017-09-23 NOTE — Telephone Encounter (Signed)
Gave patient avs report and appointments for February and mammo for January.

## 2017-10-22 ENCOUNTER — Encounter (HOSPITAL_COMMUNITY): Payer: Self-pay | Admitting: Nurse Practitioner

## 2017-10-22 ENCOUNTER — Other Ambulatory Visit: Payer: Self-pay

## 2017-10-22 ENCOUNTER — Emergency Department (HOSPITAL_COMMUNITY)
Admission: EM | Admit: 2017-10-22 | Discharge: 2017-10-22 | Disposition: A | Discharge status: Home / Self Care | Payer: Medicaid Other | Attending: Emergency Medicine | Admitting: Emergency Medicine

## 2017-10-22 DIAGNOSIS — Z79899 Other long term (current) drug therapy: Secondary | ICD-10-CM | POA: Insufficient documentation

## 2017-10-22 DIAGNOSIS — H9201 Otalgia, right ear: Secondary | ICD-10-CM | POA: Diagnosis present

## 2017-10-22 DIAGNOSIS — J45909 Unspecified asthma, uncomplicated: Secondary | ICD-10-CM | POA: Insufficient documentation

## 2017-10-22 DIAGNOSIS — H60331 Swimmer's ear, right ear: Secondary | ICD-10-CM | POA: Diagnosis not present

## 2017-10-22 DIAGNOSIS — F17210 Nicotine dependence, cigarettes, uncomplicated: Secondary | ICD-10-CM | POA: Diagnosis not present

## 2017-10-22 DIAGNOSIS — Z853 Personal history of malignant neoplasm of breast: Secondary | ICD-10-CM | POA: Diagnosis not present

## 2017-10-22 MED ORDER — NEOMYCIN-COLIST-HC-THONZONIUM 3.3-3-10-0.5 MG/ML OT SUSP
4.0000 [drp] | Freq: Once | OTIC | Status: DC
  Filled 2017-10-22: qty 10

## 2017-10-22 MED ORDER — OXYCODONE-ACETAMINOPHEN 5-325 MG PO TABS: 1.0000 | ORAL_TABLET | ORAL | 0 refills | Status: DC | PRN

## 2017-10-22 MED ORDER — OXYCODONE-ACETAMINOPHEN 5-325 MG PO TABS
1.0000 | ORAL_TABLET | Freq: Once | ORAL | Status: AC
  Administered 2017-10-22: 1 via ORAL
  Filled 2017-10-22: qty 1

## 2017-10-22 MED ORDER — NEOMYCIN-COLIST-HC-THONZONIUM 3.3-3-10-0.5 MG/ML OT SUSP
4.0000 [drp] | Freq: Once | OTIC | Status: AC
  Administered 2017-10-22: 4 [drp] via OTIC
  Filled 2017-10-22: qty 10

## 2017-10-22 MED ORDER — NEOMYCIN-POLYMYXIN-HC 3.5-10000-1 OT SUSP: 4.0000 [drp] | Freq: Four times a day (QID) | OTIC | 0 refills | Status: DC

## 2017-10-22 NOTE — ED Provider Notes (Signed)
[facility] DEPT Provider Note   CSN: [id number] Arrival date & time: 10/22/17  0354     History   Chief Complaint Chief Complaint  Patient presents with  . Otalgia    Right     HPI BIRTHA HATLER is a 46 y.o. female.  The history is provided by the patient.  She woke up with severe pain in her right ear.  Pain is rated at 8/10.  She denies any trauma to the ear but states she did take a shower before going to bed.  She denies putting any foreign objects in her ear.  She denies any difficulty hearing.  She has had some rhinorrhea and a scratchy throat recently.  No fever or chills.  She has not had anything to treat her symptoms.  Past Medical History:  Diagnosis Date  . Anxiety   . Asthma   . Breast cancer ([place])   . Breast cancer of upper-inner quadrant of left female breast ([place]) 09/08/2015  . 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 Depression   . Myalgia   . Seasonal allergies     Patient Active Problem List   Diagnosis Date Noted  . Depression 07/09/2016  . Back pain 05/06/2016  . Asthma 02/06/2016  . Asthma exacerbation 02/06/2016  . Emesis   . Morbid obesity ([place])   . Genetic testing 09/26/2015  . Cigarette smoker 09/21/2015  . Severe obesity (BMI >= 40) ([place]) 09/21/2015  . Dyspnea 09/20/2015  . Breast cancer of upper-inner quadrant of left female breast ([place]) 09/08/2015    Past Surgical History:  Procedure Laterality Date  . ABDOMINAL HYSTERECTOMY    . BREAST LUMPECTOMY Left 09/2015  . RADIOACTIVE SEED GUIDED PARTIAL MASTECTOMY WITH AXILLARY SENTINEL LYMPH NODE BIOPSY Left 10/11/2015   Procedure: RADIOACTIVE SEED LOCALIZATION LEFT BREAST LUMPECTOMY WITH LEFT  AXILLARY SENTINEL LYMPH NODE BIOPSY;  Surgeon: Excell Seltzer, MD;  Location: [facility];  Service: General;  Laterality: Left;  . TUBAL LIGATION      OB History    Gravida Para Term Preterm AB Living   4 3 3   1  3    SAB TAB Ectopic Multiple Live Births                   Home Medications    Prior to Admission medications   Medication Sig Start Date End Date Taking? Authorizing Provider  albuterol (PROVENTIL) (2.5 MG/3ML) 0.083% nebulizer solution USE 1 VIAL BY MOUTH EVERY 6 HOURS AS NEEDED FOR SOB AND WHEEZING 02/06/17   [provider]  albuterol (VENTOLIN HFA) 108 (90 BASE) MCG/ACT inhaler Inhale 2 puffs into the lungs every 6 (six) hours as needed for wheezing or shortness of breath. Reported on 11/16/2015    [provider]  cyclobenzaprine (FLEXERIL) 10 MG tablet Take 10 mg by mouth 3 (three) times daily as needed for muscle spasms.    [provider]  dicyclomine (BENTYL) 20 MG tablet Take 1 tablet (20 mg total) by mouth 2 (two) times daily as needed for spasms (abdominal cramping). 05/05/16   Little, Wenda Overland, MD  gabapentin (NEURONTIN) 100 MG capsule Take 1 capsule (100 mg total) by mouth at bedtime. 10/31/16   Truitt Merle, MD  hydrocortisone cream 1 % Apply to affected area 2 times daily 07/04/17   Ripley Fraise, MD  meloxicam (MOBIC) 15 MG tablet Take 1 tablet (15 mg total) by mouth  daily. 06/04/17   Maczis, Barth Kirks, PA-C  omeprazole (PRILOSEC) 20 MG capsule Take 1 capsule (20 mg total) by mouth daily. 06/04/17   Maczis, Barth Kirks, PA-C  ondansetron (ZOFRAN ODT) 4 MG disintegrating tablet Take 1 tablet (4 mg total) by mouth every 8 (eight) hours as needed for nausea or vomiting. 04/15/17   Mabe, Forbes Cellar, MD  polyethylene glycol (MIRALAX) packet Take 17 g by mouth daily. 09/23/17   Brunetta Genera, MD  tamoxifen (NOLVADEX) 20 MG tablet Take 1 tablet (20 mg total) by mouth daily. 06/10/17   Brunetta Genera, MD  venlafaxine XR (EFFEXOR-XR) 75 MG 24 hr capsule Take 1 capsule (75 mg total) by mouth daily with breakfast. 06/10/17   Brunetta Genera, MD    Family History Family History  Problem Relation Age of Onset  . Diabetes Mother   . Diabetes Father   .  Lung cancer Father 11       smoker  . Cancer Paternal Aunt        cervical  . Diabetes Brother   . Heart attack Paternal Grandmother        in her 23s  . Diabetes Brother        maternal half brother  . Asthma Brother   . Asthma Maternal Aunt     Social History Social History   Tobacco Use  . Smoking status: Current Every Day Smoker    Packs/day: 0.50    Years: 34.00    Pack years: 17.00    Types: Cigarettes  . Smokeless tobacco: Never Used  Substance Use Topics  . Alcohol use: No    Alcohol/week: 0.0 oz  . Drug use: No     Allergies   Patient has no known allergies.   Review of Systems Review of Systems  All other systems reviewed and are negative.    Physical Exam Updated Vital Signs BP (!) 127/101 (BP Location: Right Arm)   Pulse 80   Temp 97.8 F (36.6 C) (Oral)   Resp 16   SpO2 99%   Physical Exam  Nursing note and vitals reviewed.  46 year old female, resting comfortably and in no acute distress. Vital signs are significant for hypertension. Oxygen saturation is 99%, which is normal. Head is normocephalic and atraumatic. PERRLA, EOMI. Oropharynx is clear.  Left tympanic membrane is clear.  Right tympanic membrane is clear but there is some edema of the external auditory canal, and pain is elicited when tension is applied to the helix of the right ear. Neck is nontender and supple without adenopathy or JVD. Back is nontender and there is no CVA tenderness. Lungs are clear without rales, wheezes, or rhonchi. Chest is nontender. Heart has regular rate and rhythm without murmur. Abdomen is soft, flat, nontender without masses or hepatosplenomegaly and peristalsis is normoactive. Extremities have no cyanosis or edema, full range of motion is present. Skin is warm and dry without rash. Neurologic: Mental status is normal, cranial nerves are intact, there are no motor or sensory deficits.  ED Treatments / Results   Procedures Procedures (including  critical care time)  Medications Ordered in ED Medications  oxyCODONE-acetaminophen (PERCOCET/ROXICET) 5-325 MG per tablet 1 tablet (not administered)  neomycin-colistin-hydrocortisone-thonzonium (CORTISPORIN TC) OTIC (EAR) suspension 4 drop (not administered)     Initial Impression / Assessment and Plan / ED Course  I have reviewed the triage vital signs and the nursing notes.  Right otitis externa.  She is discharged with prescription for  oxycodone-acetaminophen for pain, and Cortisporin otic suspension.  Return precautions discussed.  Old records are reviewed, and she has a prior ED visit for otitis media, but not otitis externa.  Final Clinical Impressions(s) / ED Diagnoses   Final diagnoses:  Acute swimmer's ear of right side    ED Discharge Orders        Ordered    oxyCODONE-acetaminophen (PERCOCET) 5-325 MG tablet  Every 4 hours PRN     10/22/17 0459    neomycin-polymyxin-hydrocortisone (CORTISPORIN) 3.5-10000-1 OTIC suspension  4 times daily     77/03/40 3524       Delora Fuel, MD 81/85/90 [phone]

## 2017-10-22 NOTE — Discharge Instructions (Signed)
Take acetaminophen or ibuprofen as needed for less severe pain.

## 2017-10-22 NOTE — ED Notes (Signed)
Bed: WTR5 Expected date:  Expected time:  Means of arrival:  Comments: 

## 2017-10-22 NOTE — ED Triage Notes (Signed)
Pt BIB via ems from home with c/o waking up at 3 am with severe right ear pain. States if she pulls on her ear or lays on that side the pain is a 10/10. Denies nausea or dizziness. Patient reports she has had cold like symptoms per ems. VS: 152/98, 92, 99%, 97.9, 18. Patient has a hx of anxiety and asthma. If up moving pain is a 6/7. Denies any foreign objects being placed into ear.

## 2017-10-30 ENCOUNTER — Other Ambulatory Visit: Payer: Self-pay | Admitting: Hematology

## 2017-10-31 ENCOUNTER — Other Ambulatory Visit: Payer: Self-pay

## 2017-10-31 MED ORDER — POLYETHYLENE GLYCOL 3350 17 G PO PACK: 17.0000 g | PACK | Freq: Every day | ORAL | 1 refills | Status: DC

## 2017-11-04 ENCOUNTER — Other Ambulatory Visit: Payer: Self-pay | Admitting: *Deleted

## 2017-11-04 DIAGNOSIS — Z170 Estrogen receptor positive status [ER+]: Principal | ICD-10-CM

## 2017-11-04 DIAGNOSIS — C50212 Malignant neoplasm of upper-inner quadrant of left female breast: Secondary | ICD-10-CM

## 2017-11-04 MED ORDER — TAMOXIFEN CITRATE 20 MG PO TABS: 20.0000 mg | ORAL_TABLET | Freq: Every day | ORAL | 3 refills | Status: DC

## 2017-11-06 ENCOUNTER — Other Ambulatory Visit: Payer: Self-pay | Admitting: Hematology

## 2017-11-10 ENCOUNTER — Other Ambulatory Visit: Payer: Medicaid Other

## 2017-11-18 ENCOUNTER — Encounter (HOSPITAL_COMMUNITY): Payer: Self-pay

## 2017-11-18 ENCOUNTER — Emergency Department (HOSPITAL_COMMUNITY)
Admission: EM | Admit: 2017-11-18 | Discharge: 2017-11-18 | Disposition: A | Discharge status: Home / Self Care | Payer: Medicaid Other | Attending: Emergency Medicine | Admitting: Emergency Medicine

## 2017-11-18 ENCOUNTER — Emergency Department (HOSPITAL_COMMUNITY): Payer: Medicaid Other

## 2017-11-18 ENCOUNTER — Other Ambulatory Visit: Payer: Self-pay

## 2017-11-18 DIAGNOSIS — R059 Cough, unspecified: Secondary | ICD-10-CM

## 2017-11-18 DIAGNOSIS — F17210 Nicotine dependence, cigarettes, uncomplicated: Secondary | ICD-10-CM | POA: Insufficient documentation

## 2017-11-18 DIAGNOSIS — Z79899 Other long term (current) drug therapy: Secondary | ICD-10-CM | POA: Diagnosis not present

## 2017-11-18 DIAGNOSIS — J069 Acute upper respiratory infection, unspecified: Secondary | ICD-10-CM | POA: Diagnosis not present

## 2017-11-18 DIAGNOSIS — R05 Cough: Secondary | ICD-10-CM

## 2017-11-18 DIAGNOSIS — J45909 Unspecified asthma, uncomplicated: Secondary | ICD-10-CM | POA: Diagnosis not present

## 2017-11-18 LAB — COMPREHENSIVE METABOLIC PANEL
ALBUMIN: 3.9 g/dL (ref 3.5–5.0)
ALT: 18 U/L (ref 14–54)
AST: 22 U/L (ref 15–41)
Alkaline Phosphatase: 132 U/L — ABNORMAL HIGH (ref 38–126)
Anion gap: 6 (ref 5–15)
BUN: 7 mg/dL (ref 6–20)
CHLORIDE: 106 mmol/L (ref 101–111)
CO2: 27 mmol/L (ref 22–32)
Calcium: 9 mg/dL (ref 8.9–10.3)
Creatinine, Ser: 0.98 mg/dL (ref 0.44–1.00)
GFR calc non Af Amer: >60 mL/min (ref >60)
GLUCOSE: 93 mg/dL (ref 65–99)
Potassium: 4.1 mmol/L (ref 3.5–5.1)
SODIUM: 139 mmol/L (ref 135–145)
Total Bilirubin: 0.6 mg/dL (ref 0.3–1.2)
Total Protein: 7.7 g/dL (ref 6.5–8.1)

## 2017-11-18 LAB — CBC
HCT: 40.3 % (ref 36.0–46.0)
HEMOGLOBIN: 14 g/dL (ref 12.0–15.0)
MCH: 29.1 pg (ref 26.0–34.0)
MCHC: 34.7 g/dL (ref 30.0–36.0)
MCV: 83.8 fL (ref 78.0–100.0)
Platelets: 205 10*3/uL (ref 150–400)
RBC: 4.81 MIL/uL (ref 3.87–5.11)
RDW: 12.5 % (ref 11.5–15.5)
WBC: 6.7 10*3/uL (ref 4.0–10.5)

## 2017-11-18 LAB — URINALYSIS, ROUTINE W REFLEX MICROSCOPIC
Bilirubin Urine: NEGATIVE
Glucose, UA: NEGATIVE mg/dL
KETONES UR: NEGATIVE mg/dL
Leukocytes, UA: NEGATIVE
Nitrite: NEGATIVE
PROTEIN: NEGATIVE mg/dL
Specific Gravity, Urine: 1.011 (ref 1.005–1.030)
pH: 6 (ref 5.0–8.0)

## 2017-11-18 LAB — LIPASE, BLOOD: LIPASE: 29 U/L (ref 11–51)

## 2017-11-18 LAB — I-STAT BETA HCG BLOOD, ED (MC, WL, AP ONLY)

## 2017-11-18 MED ORDER — ONDANSETRON HCL 4 MG/2ML IJ SOLN
4.0000 mg | Freq: Once | INTRAMUSCULAR | Status: AC
Start: 2017-11-18 — End: 2017-11-18
  Administered 2017-11-18: 4 mg via INTRAVENOUS
  Filled 2017-11-18: qty 2

## 2017-11-18 MED ORDER — BENZONATATE 100 MG PO CAPS
200.0000 mg | ORAL_CAPSULE | Freq: Once | ORAL | Status: AC
  Administered 2017-11-18: 200 mg via ORAL
  Filled 2017-11-18: qty 2

## 2017-11-18 MED ORDER — SODIUM CHLORIDE 0.9 % IV SOLN
INTRAVENOUS | Status: DC
  Administered 2017-11-18: 13:00:00 via INTRAVENOUS

## 2017-11-18 MED ORDER — SODIUM CHLORIDE 0.9 % IV BOLUS (SEPSIS)
1000.0000 mL | Freq: Once | INTRAVENOUS | Status: AC
  Administered 2017-11-18: 1000 mL via INTRAVENOUS

## 2017-11-18 NOTE — ED Triage Notes (Signed)
Patient c/o generalized body aches, productive cough, right ear pain, emesis, sore throat, and dysuria since yesterday.

## 2017-11-18 NOTE — ED Provider Notes (Signed)
[facility] DEPT Provider Note   CSN: [id number] Arrival date & time: 11/18/17  4709     History   Chief Complaint Chief Complaint  Patient presents with  . Generalized Body Aches  . Cough  . Otalgia  . Emesis  . Dysuria    HPI Yolanda White is a 47 y.o. female.  47 year old female presents with 24 hours history of cough ear pain and dysuria.  Nose positive sick exposures.  Cough is been productive of green yellow sputum.  Her emesis has been nonbilious or bloody.  No diarrhea at this point.  Has had some dysuria without vaginal discharge or bleeding.  Denies any associated flank pain.  No headache or photophobia or neck pain.  No recent rashes.  Symptoms persistent and unresponsive to over-the-counter medications.      Past Medical History:  Diagnosis Date  . Anxiety   . Asthma   . Breast cancer ([place])   . 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 Depression   . Myalgia   . Seasonal allergies     Patient Active Problem List   Diagnosis Date Noted  . Depression 07/09/2016  . Back pain 05/06/2016  . Asthma 02/06/2016  . Asthma exacerbation 02/06/2016  . Emesis   . Morbid obesity ([place])   . Genetic testing 09/26/2015  . Cigarette smoker 09/21/2015  . Severe obesity (BMI >= 40) ([place]) 09/21/2015  . Dyspnea 09/20/2015  . Breast cancer of upper-inner quadrant of left female breast ([place]) 09/08/2015    Past Surgical History:  Procedure Laterality Date  . ABDOMINAL HYSTERECTOMY    . BREAST LUMPECTOMY Left 09/2015  . RADIOACTIVE SEED GUIDED PARTIAL MASTECTOMY WITH AXILLARY SENTINEL LYMPH NODE BIOPSY Left 10/11/2015   Procedure: RADIOACTIVE SEED LOCALIZATION LEFT BREAST LUMPECTOMY WITH LEFT  AXILLARY SENTINEL LYMPH NODE BIOPSY;  Surgeon: Excell Seltzer, MD;  Location: [facility];  Service: General;  Laterality:  Left;  . TUBAL LIGATION      OB History    Gravida Para Term Preterm AB Living   4 3 3   1 3    SAB TAB Ectopic Multiple Live Births                   Home Medications    Prior to Admission medications   Medication Sig Start Date End Date Taking? Authorizing Provider  albuterol (PROVENTIL) (2.5 MG/3ML) 0.083% nebulizer solution USE 1 VIAL BY MOUTH EVERY 6 HOURS AS NEEDED FOR SOB AND WHEEZING 02/06/17  Yes [provider]  albuterol (VENTOLIN HFA) 108 (90 BASE) MCG/ACT inhaler Inhale 2 puffs into the lungs every 6 (six) hours as needed for wheezing or shortness of breath. Reported on 11/16/2015   Yes [provider]  cyclobenzaprine (FLEXERIL) 10 MG tablet Take 10 mg by mouth 3 (three) times daily as needed for muscle spasms.   Yes [provider]  hydrOXYzine (ATARAX/VISTARIL) 25 MG tablet Take 25 mg by mouth 2 (two) times daily. For anxiety 10/30/17  Yes [provider]  meloxicam (MOBIC) 15 MG tablet Take 1 tablet (15 mg total) by mouth daily. 06/04/17  Yes Maczis, Barth Kirks, PA-C  nabumetone (RELAFEN) 750 MG tablet Take 750 mg by mouth 2 (two) times daily. 10/30/17  Yes [provider]  neomycin-polymyxin-hydrocortisone (CORTISPORIN) 3.5-10000-1 OTIC suspension Place 4 drops into both ears 4 (four)  times daily. X 7 days 70/62/37  Yes Delora Fuel, MD  omeprazole (PRILOSEC) 20 MG capsule Take 1 capsule (20 mg total) by mouth daily. 06/04/17  Yes Maczis, Barth Kirks, PA-C  oxyCODONE-acetaminophen (PERCOCET) 5-325 MG tablet Take 1 tablet by mouth every 4 (four) hours as needed for moderate pain. 62/83/15  Yes Delora Fuel, MD  polyethylene glycol [facility]) packet Take 17 g by mouth daily. 10/31/17  Yes Brunetta Genera, MD  tamoxifen (NOLVADEX) 20 MG tablet Take 1 tablet (20 mg total) by mouth daily. 11/04/17  Yes Brunetta Genera, MD  gabapentin (NEURONTIN) 100 MG capsule Take 1 capsule (100 mg total) by mouth at bedtime. Patient not taking: Reported  on 11/18/2017 10/31/16   Truitt Merle, MD  hydrocortisone cream 1 % Apply to affected area 2 times daily Patient not taking: Reported on 11/18/2017 07/04/17   Ripley Fraise, MD  ondansetron (ZOFRAN ODT) 4 MG disintegrating tablet Take 1 tablet (4 mg total) by mouth every 8 (eight) hours as needed for nausea or vomiting. Patient not taking: Reported on 11/18/2017 04/15/17   Pixie Casino, MD  venlafaxine XR (EFFEXOR-XR) 75 MG 24 hr capsule Take 1 capsule (75 mg total) by mouth daily with breakfast. Patient not taking: Reported on 11/18/2017 06/10/17   Brunetta Genera, MD    Family History Family History  Problem Relation Age of Onset  . Diabetes Mother   . Diabetes Father   . Lung cancer Father 68       smoker  . Cancer Paternal Aunt        cervical  . Diabetes Brother   . Heart attack Paternal Grandmother        in her 40s  . Diabetes Brother        maternal half brother  . Asthma Brother   . Asthma Maternal Aunt     Social History Social History   Tobacco Use  . Smoking status: Current Every Day Smoker    Packs/day: 0.50    Years: 34.00    Pack years: 17.00    Types: Cigarettes  . Smokeless tobacco: Never Used  Substance Use Topics  . Alcohol use: No    Alcohol/week: 0.0 oz  . Drug use: No     Allergies   Patient has no known allergies.   Review of Systems Review of Systems  All other systems reviewed and are negative.    Physical Exam Updated Vital Signs BP 120/84 (BP Location: Right Arm)   Pulse 98   Temp 98.1 F (36.7 C) (Oral)   Resp 18   Ht 1.575 m (5\' 2" )   Wt 90.7 kg (200 lb)   SpO2 97%   BMI 36.58 kg/m   Physical Exam  Constitutional: She is oriented to person, place, and time. She appears well-developed and well-nourished.  Non-toxic appearance. No distress.  HENT:  Head: Normocephalic and atraumatic.  Eyes: Conjunctivae, EOM and lids are normal. Pupils are equal, round, and reactive to light.  Neck: Normal range of motion. Neck supple. No  tracheal deviation present. No thyroid mass present.  Cardiovascular: Normal rate, regular rhythm and normal heart sounds. Exam reveals no gallop.  No murmur heard. Pulmonary/Chest: Effort normal and breath sounds normal. No stridor. No respiratory distress. She has no decreased breath sounds. She has no wheezes. She has no rhonchi. She has no rales.  Abdominal: Soft. Normal appearance and bowel sounds are normal. She exhibits no distension. There is no tenderness. There is no rebound  and no CVA tenderness.  Musculoskeletal: Normal range of motion. She exhibits no edema or tenderness.  Neurological: She is alert and oriented to person, place, and time. She has normal strength. No cranial nerve deficit or sensory deficit. GCS eye subscore is 4. GCS verbal subscore is 5. GCS motor subscore is 6.  Skin: Skin is warm and dry. No abrasion and no rash noted.  Psychiatric: She has a normal mood and affect. Her speech is normal and behavior is normal.  Nursing note and vitals reviewed.    ED Treatments / Results  Labs (all labs ordered are listed, but only abnormal results are displayed) Labs Reviewed  LIPASE, BLOOD  COMPREHENSIVE METABOLIC PANEL  CBC  URINALYSIS, ROUTINE W REFLEX MICROSCOPIC  I-STAT BETA HCG BLOOD, ED ([facility], [facility], [facility] ONLY)    EKG  EKG Interpretation None       Radiology No results found.  Procedures Procedures (including critical care time)  Medications Ordered in ED Medications  benzonatate (TESSALON) capsule 200 mg (not administered)  sodium chloride 0.9 % bolus 1,000 mL (not administered)  0.9 %  sodium chloride infusion (not administered)  ondansetron (ZOFRAN) injection 4 mg (not administered)     Initial Impression / Assessment and Plan / ED Course  I have reviewed the triage vital signs and the nursing notes.  Pertinent labs & imaging results that were available during my care of the patient were reviewed by me and considered in my medical decision making  (see chart for details).     Patient with negative workup here and likely viral illness.  Stable for discharge  Final Clinical Impressions(s) / ED Diagnoses   Final diagnoses:  Cough    ED Discharge Orders    None       Lacretia Leigh, MD 11/18/17 1417

## 2017-12-17 ENCOUNTER — Ambulatory Visit: Payer: Medicaid Other | Admitting: Hematology

## 2017-12-17 ENCOUNTER — Other Ambulatory Visit: Payer: Medicaid Other

## 2017-12-18 ENCOUNTER — Telehealth: Payer: Self-pay | Admitting: Hematology

## 2017-12-18 NOTE — Telephone Encounter (Signed)
Patient called to reschedule  °

## 2017-12-22 ENCOUNTER — Ambulatory Visit: Payer: Medicaid Other | Admitting: Hematology

## 2017-12-22 ENCOUNTER — Other Ambulatory Visit: Payer: Medicaid Other

## 2018-01-01 ENCOUNTER — Ambulatory Visit
Admission: RE | Admit: 2018-01-01 | Discharge: 2018-01-01 | Disposition: A | Discharge status: Home / Self Care | Payer: Medicaid Other | Source: Ambulatory Visit | Attending: Hematology | Admitting: Hematology

## 2018-01-01 DIAGNOSIS — C50212 Malignant neoplasm of upper-inner quadrant of left female breast: Secondary | ICD-10-CM

## 2018-01-01 DIAGNOSIS — Z170 Estrogen receptor positive status [ER+]: Principal | ICD-10-CM

## 2018-01-01 HISTORY — DX: Personal history of irradiation: Z92.3
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
## 2018-01-05 ENCOUNTER — Inpatient Hospital Stay (HOSPITAL_BASED_OUTPATIENT_CLINIC_OR_DEPARTMENT_OTHER): Payer: Medicaid Other | Admitting: Hematology

## 2018-01-05 ENCOUNTER — Encounter: Payer: Self-pay | Admitting: Hematology

## 2018-01-05 ENCOUNTER — Inpatient Hospital Stay: Payer: Medicaid Other | Attending: Hematology

## 2018-01-05 ENCOUNTER — Encounter: Payer: Self-pay | Admitting: *Deleted

## 2018-01-05 VITALS — BP 108/80 | HR 81 | Temp 97.7°F | Resp 20 | Ht 62.0 in | Wt 223.6 lb

## 2018-01-05 DIAGNOSIS — F17211 Nicotine dependence, cigarettes, in remission: Secondary | ICD-10-CM

## 2018-01-05 DIAGNOSIS — Z170 Estrogen receptor positive status [ER+]: Secondary | ICD-10-CM | POA: Insufficient documentation

## 2018-01-05 DIAGNOSIS — Z716 Tobacco abuse counseling: Secondary | ICD-10-CM | POA: Diagnosis not present

## 2018-01-05 DIAGNOSIS — Z91410 Personal history of adult physical and sexual abuse: Secondary | ICD-10-CM | POA: Diagnosis not present

## 2018-01-05 DIAGNOSIS — M797 Fibromyalgia: Secondary | ICD-10-CM | POA: Diagnosis not present

## 2018-01-05 DIAGNOSIS — Z923 Personal history of irradiation: Secondary | ICD-10-CM | POA: Diagnosis not present

## 2018-01-05 DIAGNOSIS — F32A Depression, unspecified: Secondary | ICD-10-CM

## 2018-01-05 DIAGNOSIS — R232 Flushing: Secondary | ICD-10-CM | POA: Insufficient documentation

## 2018-01-05 DIAGNOSIS — F419 Anxiety disorder, unspecified: Secondary | ICD-10-CM | POA: Insufficient documentation

## 2018-01-05 DIAGNOSIS — E669 Obesity, unspecified: Secondary | ICD-10-CM | POA: Insufficient documentation

## 2018-01-05 DIAGNOSIS — R194 Change in bowel habit: Secondary | ICD-10-CM

## 2018-01-05 DIAGNOSIS — C50212 Malignant neoplasm of upper-inner quadrant of left female breast: Secondary | ICD-10-CM | POA: Diagnosis not present

## 2018-01-05 DIAGNOSIS — Z9114 Patient's other noncompliance with medication regimen: Secondary | ICD-10-CM | POA: Insufficient documentation

## 2018-01-05 DIAGNOSIS — Z7982 Long term (current) use of aspirin: Secondary | ICD-10-CM

## 2018-01-05 DIAGNOSIS — F17210 Nicotine dependence, cigarettes, uncomplicated: Secondary | ICD-10-CM | POA: Diagnosis not present

## 2018-01-05 DIAGNOSIS — Z8619 Personal history of other infectious and parasitic diseases: Secondary | ICD-10-CM | POA: Insufficient documentation

## 2018-01-05 DIAGNOSIS — F329 Major depressive disorder, single episode, unspecified: Secondary | ICD-10-CM | POA: Insufficient documentation

## 2018-01-05 DIAGNOSIS — Z79899 Other long term (current) drug therapy: Secondary | ICD-10-CM | POA: Insufficient documentation

## 2018-01-05 LAB — CBC WITH DIFFERENTIAL (CANCER CENTER ONLY)
Basophils Absolute: 0 10*3/uL (ref 0.0–0.1)
Basophils Relative: 1 %
EOS ABS: 0.5 10*3/uL (ref 0.0–0.5)
EOS PCT: 7 %
HCT: 40.2 % (ref 34.8–46.6)
Hemoglobin: 13.7 g/dL (ref 11.6–15.9)
LYMPHS ABS: 2.2 10*3/uL (ref 0.9–3.3)
LYMPHS PCT: 28 %
MCH: 29.1 pg (ref 25.1–34.0)
MCHC: 34.1 g/dL (ref 31.5–36.0)
MCV: 85.5 fL (ref 79.5–101.0)
MONO ABS: 0.6 10*3/uL (ref 0.1–0.9)
Monocytes Relative: 8 %
Neutro Abs: 4.3 10*3/uL (ref 1.5–6.5)
Neutrophils Relative %: 56 %
PLATELETS: 243 10*3/uL (ref 145–400)
RBC: 4.7 MIL/uL (ref 3.70–5.45)
RDW: 13.4 % (ref 11.2–14.5)
WBC Count: 7.6 10*3/uL (ref 3.9–10.3)

## 2018-01-05 LAB — COMPREHENSIVE METABOLIC PANEL
ALBUMIN: 3.4 g/dL — AB (ref 3.5–5.0)
ALT: 15 U/L (ref 0–55)
AST: 12 U/L (ref 5–34)
Alkaline Phosphatase: 104 U/L (ref 40–150)
Anion gap: 6 (ref 3–11)
BILIRUBIN TOTAL: 0.5 mg/dL (ref 0.2–1.2)
BUN: 12 mg/dL (ref 7–26)
CO2: 27 mmol/L (ref 22–29)
CREATININE: 1.11 mg/dL — AB (ref 0.60–1.10)
Calcium: 9.5 mg/dL (ref 8.4–10.4)
Chloride: 104 mmol/L (ref 98–109)
GFR calc Af Amer: >60 mL/min (ref >60)
GFR calc non Af Amer: 59 mL/min — ABNORMAL LOW (ref >60)
GLUCOSE: 78 mg/dL (ref 70–140)
POTASSIUM: 4.8 mmol/L (ref 3.5–5.1)
Sodium: 137 mmol/L (ref 136–145)
Total Protein: 7.3 g/dL (ref 6.4–8.3)

## 2018-01-05 LAB — RETICULOCYTES
RBC.: 4.7 MIL/uL (ref 3.70–5.45)
Retic Count, Absolute: 79.9 10*3/uL (ref 33.7–90.7)
Retic Ct Pct: 1.7 % (ref 0.7–2.1)
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
## 2018-01-06 ENCOUNTER — Encounter: Payer: Self-pay | Admitting: *Deleted

## 2018-01-06 ENCOUNTER — Telehealth: Payer: Self-pay

## 2018-01-06 NOTE — Progress Notes (Signed)
[place] Clinical Social Work  Holiday representative contacted patient at home to offer support and discuss counseling and mental health options.  Patient stated she was doing "ok", and open to counseling and referral options.  Patient was agreeable to CSW making a referral to [facility].  CSW contacted provided and left a message.  CSW awaiting return call to complete referral.  CSW will follow up with patient.    Johnnye Lana, MSW, LCSW, OSW-C Clinical Social Worker [facility] [phone]

## 2018-01-06 NOTE — Telephone Encounter (Signed)
Spoke with patient concerning upcoming appointment. Mailing her a calender. Per 3/11 los

## 2018-01-07 ENCOUNTER — Encounter: Payer: Self-pay | Admitting: General Practice

## 2018-01-07 NOTE — Progress Notes (Signed)
[place] CSW Progress Notes  CSW spoke w patient and discussed preferences for mental health treatment.  Patient states that she does not like crowds, is easily frustrated w waiting for providers.  Referral to [facility] was overwhelming for patient, became agitated and walked out without being seen.  No current mental health providers but does see [facility] as a primary care provider.  CSW made referral to [place] for initial assessment and medications management.  Provider will call patient w appointment.  CSW will also refer patient to [facility] for behavioral health case management in the community setting.  Discussed transport options w patient - encouraged her to reconnect w Medicaid transport as she is relying on daughter for rides, daughter is not always able to transport at times needed.  Yolanda Shell, LCSW Clinical Social Worker Phone:  [phone]

## 2018-01-08 ENCOUNTER — Encounter: Payer: Self-pay | Admitting: General Practice

## 2018-01-08 ENCOUNTER — Emergency Department (HOSPITAL_COMMUNITY)
Admission: EM | Admit: 2018-01-08 | Discharge: 2018-01-09 | Disposition: A | Discharge status: Home / Self Care | Payer: Medicaid Other | Attending: Emergency Medicine | Admitting: Emergency Medicine

## 2018-01-08 ENCOUNTER — Encounter (HOSPITAL_COMMUNITY): Payer: Self-pay | Admitting: Emergency Medicine

## 2018-01-08 DIAGNOSIS — Z79899 Other long term (current) drug therapy: Secondary | ICD-10-CM | POA: Diagnosis not present

## 2018-01-08 DIAGNOSIS — Y92003 Bedroom of unspecified non-institutional (private) residence as the place of occurrence of the external cause: Secondary | ICD-10-CM | POA: Diagnosis not present

## 2018-01-08 DIAGNOSIS — J45909 Unspecified asthma, uncomplicated: Secondary | ICD-10-CM | POA: Diagnosis not present

## 2018-01-08 DIAGNOSIS — M79605 Pain in left leg: Secondary | ICD-10-CM | POA: Diagnosis present

## 2018-01-08 DIAGNOSIS — X58XXXA Exposure to other specified factors, initial encounter: Secondary | ICD-10-CM | POA: Diagnosis not present

## 2018-01-08 DIAGNOSIS — I8390 Asymptomatic varicose veins of unspecified lower extremity: Secondary | ICD-10-CM

## 2018-01-08 DIAGNOSIS — R079 Chest pain, unspecified: Secondary | ICD-10-CM | POA: Insufficient documentation

## 2018-01-08 DIAGNOSIS — Y9389 Activity, other specified: Secondary | ICD-10-CM | POA: Diagnosis not present

## 2018-01-08 DIAGNOSIS — Z853 Personal history of malignant neoplasm of breast: Secondary | ICD-10-CM | POA: Diagnosis not present

## 2018-01-08 DIAGNOSIS — F17210 Nicotine dependence, cigarettes, uncomplicated: Secondary | ICD-10-CM | POA: Diagnosis not present

## 2018-01-08 DIAGNOSIS — I83899 Varicose veins of unspecified lower extremities with other complications: Secondary | ICD-10-CM

## 2018-01-08 DIAGNOSIS — Y999 Unspecified external cause status: Secondary | ICD-10-CM | POA: Insufficient documentation

## 2018-01-08 DIAGNOSIS — S8012XA Contusion of left lower leg, initial encounter: Secondary | ICD-10-CM | POA: Insufficient documentation

## 2018-01-08 MED ORDER — IBUPROFEN 400 MG PO TABS
600.0000 mg | ORAL_TABLET | Freq: Once | ORAL | Status: AC
  Administered 2018-01-08: 23:00:00 600 mg via ORAL
  Filled 2018-01-08: qty 1

## 2018-01-08 MED ORDER — CYCLOBENZAPRINE HCL 10 MG PO TABS
5.0000 mg | ORAL_TABLET | Freq: Once | ORAL | Status: AC
  Administered 2018-01-08: 5 mg via ORAL
  Filled 2018-01-08: qty 1

## 2018-01-08 NOTE — ED Provider Notes (Signed)
[facility] EMERGENCY DEPARTMENT Provider Note   CSN: [id number] Arrival date & time: 01/08/18  1729     History   Chief Complaint Chief Complaint  Patient presents with  . ruptured vericose vein    HPI Yolanda White is a 47 y.o. female with hx of breast CA, obesity, asthma, back pain, depression and every day smoker who presents to the ED via EMS with c/o pain to the back of her leg that was burning, sharp and stabbing. Patient reports she thought she got bit by something and has two marks behind her left knee. Patient reports swelling to the posterior left leg and red spots and bruising. Patient reports that at the time the pain suddenly started she bent down to see what it was and when she did she felt a sharp pulling sensation in her chest that went away after about a minute and has not returned.    HPI  Past Medical History:  Diagnosis Date  . Anxiety   . Asthma   . Breast cancer ([place])   . 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 Depression   . Myalgia   . Personal history of radiation therapy   . Seasonal allergies     Patient Active Problem List   Diagnosis Date Noted  . Depression 07/09/2016  . Back pain 05/06/2016  . Asthma 02/06/2016  . Asthma exacerbation 02/06/2016  . Emesis   . Morbid obesity ([place])   . Genetic testing 09/26/2015  . Cigarette smoker 09/21/2015  . Severe obesity (BMI >= 40) ([place]) 09/21/2015  . Dyspnea 09/20/2015  . Breast cancer of upper-inner quadrant of left female breast ([place]) 09/08/2015    Past Surgical History:  Procedure Laterality Date  . ABDOMINAL HYSTERECTOMY    . BREAST LUMPECTOMY Left 09/2015  . RADIOACTIVE SEED GUIDED PARTIAL MASTECTOMY WITH AXILLARY SENTINEL LYMPH NODE BIOPSY Left 10/11/2015   Procedure: RADIOACTIVE SEED LOCALIZATION LEFT BREAST LUMPECTOMY WITH LEFT  AXILLARY SENTINEL LYMPH  NODE BIOPSY;  Surgeon: Excell Seltzer, MD;  Location: [facility];  Service: General;  Laterality: Left;  . TUBAL LIGATION      OB History    Gravida Para Term Preterm AB Living   4 3 3   1 3    SAB TAB Ectopic Multiple Live Births                   Home Medications    Prior to Admission medications   Medication Sig Start Date End Date Taking? Authorizing Provider  albuterol (PROVENTIL) (2.5 MG/3ML) 0.083% nebulizer solution USE 1 VIAL BY MOUTH EVERY 6 HOURS AS NEEDED FOR SOB AND WHEEZING 02/06/17   [provider]  albuterol (VENTOLIN HFA) 108 (90 BASE) MCG/ACT inhaler Inhale 2 puffs into the lungs every 6 (six) hours as needed for wheezing or shortness of breath. Reported on 11/16/2015    [provider]  cyclobenzaprine (FLEXERIL) 10 MG tablet Take 10 mg by mouth 3 (three) times daily as needed for muscle spasms.    [provider]  gabapentin (NEURONTIN) 100 MG capsule Take 1 capsule (100 mg total) by mouth at bedtime. Patient not taking: Reported on 11/18/2017 10/31/16   Truitt Merle, MD  hydrocortisone cream 1 % Apply to affected area 2 times daily Patient not taking: Reported on 11/18/2017 07/04/17   Ripley Fraise, MD  hydrOXYzine (ATARAX/VISTARIL) 25 MG tablet Take 25 mg by mouth 2 (two) times daily. For anxiety 10/30/17   [provider]  meloxicam (MOBIC) 15 MG tablet Take 1 tablet (15 mg total) by mouth daily. Patient not taking: Reported on 01/05/2018 06/04/17   Maczis, Barth Kirks, PA-C  nabumetone (RELAFEN) 750 MG tablet Take 750 mg by mouth 2 (two) times daily. 10/30/17   [provider]  neomycin-polymyxin-hydrocortisone (CORTISPORIN) 3.5-10000-1 OTIC suspension Place 4 drops into both ears 4 (four) times daily. X 7 days Patient not taking: Reported on 9/56/2130 86/57/84   Delora Fuel, MD  omeprazole (PRILOSEC) 20 MG capsule Take 1 capsule (20 mg total) by mouth daily. Patient not taking: Reported on 01/05/2018 06/04/17    Maczis, Barth Kirks, PA-C  phentermine (ADIPEX-P) 37.5 MG tablet Take 37.5 mg by mouth every morning. 10/30/17   [provider]  polyethylene glycol (MIRALAX) packet Take 17 g by mouth daily. 10/31/17   Brunetta Genera, MD  tamoxifen (NOLVADEX) 20 MG tablet Take 1 tablet (20 mg total) by mouth daily. Patient not taking: Reported on 01/05/2018 11/04/17   Brunetta Genera, MD    Family History Family History  Problem Relation Age of Onset  . Diabetes Mother   . Diabetes Father   . Lung cancer Father 52       smoker  . Cancer Paternal Aunt        cervical  . Diabetes Brother   . Heart attack Paternal Grandmother        in her 41s  . Diabetes Brother        maternal half brother  . Asthma Brother   . Asthma Maternal Aunt     Social History Social History   Tobacco Use  . Smoking status: Current Every Day Smoker    Packs/day: 0.50    Years: 34.00    Pack years: 17.00    Types: Cigarettes  . Smokeless tobacco: Never Used  Substance Use Topics  . Alcohol use: No    Alcohol/week: 0.0 oz  . Drug use: No     Allergies   Patient has no known allergies.   Review of Systems Review of Systems  Constitutional: Negative for fatigue.  HENT: Negative.   Eyes: Negative for visual disturbance.  Cardiovascular: Positive for chest pain (that resolved after one minute).  Gastrointestinal: Negative for abdominal pain, nausea and vomiting.  Musculoskeletal: Positive for arthralgias.  Skin: Positive for color change.  Neurological: Negative for weakness and headaches.  Psychiatric/Behavioral: Negative for confusion.     Physical Exam Updated Vital Signs BP (!) 121/95   Pulse 74   Temp 99 F (37.2 C) (Oral)   Resp 17   Ht 5\' 4"  (1.626 m)   Wt 99.8 kg (220 lb)   SpO2 97%   BMI 37.76 kg/m   Physical Exam  Constitutional: She appears well-developed and well-nourished. No distress.  HENT:  Head: Normocephalic.  Eyes: EOM are normal.  Neck: Neck supple.    Cardiovascular: Normal rate and regular rhythm.  Pulmonary/Chest: Effort normal and breath sounds normal.  Abdominal: Soft. There is no tenderness.  Musculoskeletal: Normal range of motion.       Left knee: She exhibits ecchymosis. She exhibits normal range of motion, no swelling, no erythema and normal alignment. Tenderness found.       Legs: Neurological: She is alert.  Skin: Skin is warm and dry.  Skin intact  Psychiatric: She has a normal mood and affect.  Nursing note and vitals reviewed.   Dr. Ralene Bathe in to see the patient. Will order labs and CT.  ED Treatments / Results  Labs (all labs ordered are listed, but only abnormal results are displayed) Labs Reviewed  CBC - Abnormal; Notable for the following components:      Result Value   WBC 12.1 (*)    All other components within normal limits  BASIC METABOLIC PANEL - Abnormal; Notable for the following components:   Glucose, Bld 122 (*)    Creatinine, Ser 1.16 (*)    GFR calc non Af Amer 56 (*)    All other components within normal limits  I-STAT TROPONIN, ED    EKG  EKG Interpretation  Date/Time:  Thursday January 08 2018 17:36:25 EDT Ventricular Rate:  85 PR Interval:  136 QRS Duration: 74 QT Interval:  362 QTC Calculation: 430 R Axis:   25 Text Interpretation:  Normal sinus rhythm Low voltage QRS Cannot rule out Anterior infarct , age undetermined Abnormal ECG Confirmed by Quintella Reichert [phone]) on 01/08/2018 11:14:25 PM       Radiology Ct Angio Ao+bifem W & Or Wo Contrast  Result Date: 01/09/2018 CLINICAL DATA:  Acute onset of sharp stabbing pain at the back of the left leg. Possible bite injury. Bruising and swelling along the left inner thigh. Sharp chest pain. EXAM: CT ANGIOGRAPHY CHEST, ABDOMEN AND PELVIS CT ANGIOGRAPHY OF ABDOMINAL AORTA WITH ILIOFEMORAL RUNOFF TECHNIQUE: Multidetector CT imaging through the chest, abdomen, pelvis and lower extremities was performed using the standard protocol during bolus  administration of intravenous contrast. Multiplanar reconstructed images and MIPs were obtained and reviewed to evaluate the vascular anatomy. CONTRAST:  176mL ISOVUE-370 IOPAMIDOL (ISOVUE-370) INJECTION 76% COMPARISON:  CT of the abdomen and pelvis performed 02/06/2016, and chest radiograph performed 11/18/2017 FINDINGS: CTA CHEST FINDINGS Cardiovascular: There is no evidence of aortic dissection. There is no evidence of aneurysmal dilatation. No calcific atherosclerotic disease is seen. There is no evidence of significant pulmonary embolus. The heart is normal in size. The great vessels are grossly unremarkable. Mediastinum/Nodes: The mediastinum is unremarkable in appearance. No mediastinal lymphadenopathy is seen. No pericardial effusion is identified. The visualized portions of the thyroid gland are unremarkable. No axillary lymphadenopathy is seen. Lungs/Pleura: A 5 mm pleural-based nodule at the left lower lobe (image 85 of 147) appears relatively stable from 2017. The lungs are otherwise clear. No pleural effusion or pneumothorax is seen. No masses are identified. Musculoskeletal: No acute osseous abnormalities are identified. The visualized musculature is unremarkable in appearance. Review of the MIP images confirms the above findings. CTA ABDOMEN AND PELVIS FINDINGS VASCULAR Aorta: There is no evidence of aortic dissection. No aneurysmal dilatation is seen. No calcific atherosclerotic disease is appreciated. Celiac: The celiac trunk appears patent. SMA: The superior mesenteric artery appears patent. Renals: The renal arteries appear patent bilaterally. IMA: The inferior mesenteric artery is patent. Inflow: The common, internal and external iliac arteries are grossly unremarkable. Veins: Visualized venous structures are grossly unremarkable in appearance, though not well assessed given the phase of contrast enhancement. Review of the MIP images confirms the above findings. RIGHT Lower Extremity Inflow: The  right common femoral artery, profunda femoris and superficial femoral artery appear intact. No calcific atherosclerotic disease is seen. Outflow: The popliteal artery and its proximal branches are grossly unremarkable. Runoff: Not assessed on this study. LEFT Lower Extremity Inflow: The left common femoral artery, profunda femoris artery and superficial femoral artery appear intact. No calcific atherosclerotic disease is seen.  Outflow: The left popliteal artery and its proximal branches are grossly unremarkable. Runoff: Not assessed on this study. Veins: Visualized venous structures are grossly unremarkable in appearance. NON-VASCULAR Hepatobiliary: The liver is unremarkable in appearance. The gallbladder is unremarkable in appearance. The common bile duct remains normal in caliber. Pancreas: The pancreas is within normal limits. Spleen: The spleen is unremarkable in appearance. Adrenals/Urinary Tract: The adrenal glands are unremarkable in appearance. The kidneys are within normal limits. There is no evidence of hydronephrosis. No renal or ureteral stones are identified. No perinephric stranding is seen. Stomach/Bowel: The stomach is unremarkable in appearance. The small bowel is within normal limits. The appendix is normal in caliber, without evidence of appendicitis. The colon is unremarkable in appearance. Lymphatic: No retroperitoneal or pelvic sidewall lymphadenopathy is seen. Reproductive: The bladder is mildly distended and grossly unremarkable. The patient is status post hysterectomy. No suspicious adnexal masses are seen. The ovaries are grossly symmetric. Other: Mild soft tissue stranding is noted at the left posteromedial thigh, likely corresponding to the location of bruising. There is no evidence of abscess. Musculoskeletal: No acute osseous abnormalities are identified. The visualized musculature is unremarkable in appearance. Review of the MIP images confirms the above findings. IMPRESSION: 1. No  evidence of aortic dissection. No evidence of aneurysmal dilatation. No calcific atherosclerotic disease seen. 2. No evidence of significant pulmonary embolus. 3. Mild soft tissue inflammation at the left posteromedial thigh, likely corresponding to the location of bruising. No evidence of abscess. Left thigh otherwise unremarkable in appearance. Electronically Signed   By: Garald Balding M.D.   On: 01/09/2018 02:24   Ct Angio Chest/abd/pel For Dissection W And/or Wo Contrast  Result Date: 01/09/2018 CLINICAL DATA:  Acute onset of sharp stabbing pain at the back of the left leg. Possible bite injury. Bruising and swelling along the left inner thigh. Sharp chest pain. EXAM: CT ANGIOGRAPHY CHEST, ABDOMEN AND PELVIS CT ANGIOGRAPHY OF ABDOMINAL AORTA WITH ILIOFEMORAL RUNOFF TECHNIQUE: Multidetector CT imaging through the chest, abdomen, pelvis and lower extremities was performed using the standard protocol during bolus administration of intravenous contrast. Multiplanar reconstructed images and MIPs were obtained and reviewed to evaluate the vascular anatomy. CONTRAST:  127mL ISOVUE-370 IOPAMIDOL (ISOVUE-370) INJECTION 76% COMPARISON:  CT of the abdomen and pelvis performed 02/06/2016, and chest radiograph performed 11/18/2017 FINDINGS: CTA CHEST FINDINGS Cardiovascular: There is no evidence of aortic dissection. There is no evidence of aneurysmal dilatation. No calcific atherosclerotic disease is seen. There is no evidence of significant pulmonary embolus. The heart is normal in size. The great vessels are grossly unremarkable. Mediastinum/Nodes: The mediastinum is unremarkable in appearance. No mediastinal lymphadenopathy is seen. No pericardial effusion is identified. The visualized portions of the thyroid gland are unremarkable. No axillary lymphadenopathy is seen. Lungs/Pleura: A 5 mm pleural-based nodule at the left lower lobe (image 85 of 147) appears relatively stable from 2017. The lungs are otherwise  clear. No pleural effusion or pneumothorax is seen. No masses are identified. Musculoskeletal: No acute osseous abnormalities are identified. The visualized musculature is unremarkable in appearance. Review of the MIP images confirms the above findings. CTA ABDOMEN AND PELVIS FINDINGS VASCULAR Aorta: There is no evidence of aortic dissection. No aneurysmal dilatation is seen. No calcific atherosclerotic disease is appreciated. Celiac: The celiac trunk appears patent. SMA: The superior mesenteric artery appears patent. Renals: The renal arteries appear patent bilaterally. IMA: The inferior mesenteric artery is patent. Inflow: The common, internal and external iliac arteries are grossly unremarkable. Veins: Visualized venous structures are  grossly unremarkable in appearance, though not well assessed given the phase of contrast enhancement. Review of the MIP images confirms the above findings. RIGHT Lower Extremity Inflow: The right common femoral artery, profunda femoris and superficial femoral artery appear intact. No calcific atherosclerotic disease is seen. Outflow: The popliteal artery and its proximal branches are grossly unremarkable. Runoff: Not assessed on this study. LEFT Lower Extremity Inflow: The left common femoral artery, profunda femoris artery and superficial femoral artery appear intact. No calcific atherosclerotic disease is seen. Outflow: The left popliteal artery and its proximal branches are grossly unremarkable. Runoff: Not assessed on this study. Veins: Visualized venous structures are grossly unremarkable in appearance. NON-VASCULAR Hepatobiliary: The liver is unremarkable in appearance. The gallbladder is unremarkable in appearance. The common bile duct remains normal in caliber. Pancreas: The pancreas is within normal limits. Spleen: The spleen is unremarkable in appearance. Adrenals/Urinary Tract: The adrenal glands are unremarkable in appearance. The kidneys are within normal limits. There  is no evidence of hydronephrosis. No renal or ureteral stones are identified. No perinephric stranding is seen. Stomach/Bowel: The stomach is unremarkable in appearance. The small bowel is within normal limits. The appendix is normal in caliber, without evidence of appendicitis. The colon is unremarkable in appearance. Lymphatic: No retroperitoneal or pelvic sidewall lymphadenopathy is seen. Reproductive: The bladder is mildly distended and grossly unremarkable. The patient is status post hysterectomy. No suspicious adnexal masses are seen. The ovaries are grossly symmetric. Other: Mild soft tissue stranding is noted at the left posteromedial thigh, likely corresponding to the location of bruising. There is no evidence of abscess. Musculoskeletal: No acute osseous abnormalities are identified. The visualized musculature is unremarkable in appearance. Review of the MIP images confirms the above findings. IMPRESSION: 1. No evidence of aortic dissection. No evidence of aneurysmal dilatation. No calcific atherosclerotic disease seen. 2. No evidence of significant pulmonary embolus. 3. Mild soft tissue inflammation at the left posteromedial thigh, likely corresponding to the location of bruising. No evidence of abscess. Left thigh otherwise unremarkable in appearance. Electronically Signed   By: Garald Balding M.D.   On: 01/09/2018 02:24    Procedures Procedures (including critical care time)  Medications Ordered in ED Medications  ibuprofen (ADVIL,MOTRIN) tablet 600 mg (600 mg Oral Given 01/08/18 2247)  cyclobenzaprine (FLEXERIL) tablet 5 mg (5 mg Oral Given 01/08/18 2248)  iopamidol (ISOVUE-370) 76 % injection (100 mLs  Contrast Given 01/09/18 0132)     Initial Impression / Assessment and Plan / ED Course  I have reviewed the triage vital signs and the nursing notes. 47 y.o. female with sudden onset left leg pain and bruising stable for d/c with normal CT angio. Patient ambulatory at d/c and appear stable  in no distress. Return precautions discussed.  Final Clinical Impressions(s) / ED Diagnoses   Final diagnoses:  Hematoma of leg, left, initial encounter  Left leg pain    ED Discharge Orders    None       Debroah Baller [place], [place] 01/09/18 0259    Quintella Reichert, MD 01/10/18 1440

## 2018-01-08 NOTE — ED Notes (Signed)
Pt and family member outside of room, wanting to leave, stating that their ride was here. Explained that the EDP wished to see her d/t some of her past hx and report of momentary CP earlier. Pt agreed and returned to room.

## 2018-01-08 NOTE — ED Notes (Signed)
ED Provider at bedside. 

## 2018-01-08 NOTE — ED Triage Notes (Signed)
Pt states she was sitting by the bed and began having sharp stabbing pain to back of leg. Pt thinks she got bit by something and has two marks behind her left knee, and has swelling to left shin with red spots. Upon assessment there is an area of bruising to left inner thigh and swelling, this is where the pt is pointing to the pain at. During this she had an episode of sharp chest pain that resolved after 2 minutes until she laid down.

## 2018-01-08 NOTE — Progress Notes (Signed)
[facility] CSW Progress Notes  Patient scheduled for initial assessment for mental health needs w [facility] on March 25 at 1:45 PM.  Agency has connected w patient.  Edwyna Shell, LCSW Clinical Social Worker Phone:  [phone]

## 2018-01-09 ENCOUNTER — Emergency Department (HOSPITAL_COMMUNITY): Payer: Medicaid Other

## 2018-01-09 LAB — I-STAT TROPONIN, ED: TROPONIN I, POC: 0.03 ng/mL (ref 0.00–0.08)

## 2018-01-09 LAB — BASIC METABOLIC PANEL
Anion gap: 11 (ref 5–15)
BUN: 10 mg/dL (ref 6–20)
CO2: 24 mmol/L (ref 22–32)
CREATININE: 1.16 mg/dL — AB (ref 0.44–1.00)
Calcium: 8.9 mg/dL (ref 8.9–10.3)
Chloride: 103 mmol/L (ref 101–111)
GFR calc non Af Amer: 56 mL/min — ABNORMAL LOW (ref >60)
Glucose, Bld: 122 mg/dL — ABNORMAL HIGH (ref 65–99)
Potassium: 3.7 mmol/L (ref 3.5–5.1)
SODIUM: 138 mmol/L (ref 135–145)

## 2018-01-09 LAB — CBC
HCT: 38.8 % (ref 36.0–46.0)
Hemoglobin: 13.5 g/dL (ref 12.0–15.0)
MCH: 29.1 pg (ref 26.0–34.0)
MCHC: 34.8 g/dL (ref 30.0–36.0)
MCV: 83.6 fL (ref 78.0–100.0)
PLATELETS: 270 10*3/uL (ref 150–400)
RBC: 4.64 MIL/uL (ref 3.87–5.11)
RDW: 13.4 % (ref 11.5–15.5)
WBC: 12.1 10*3/uL — AB (ref 4.0–10.5)

## 2018-01-09 MED ORDER — IOPAMIDOL (ISOVUE-370) INJECTION 76%
INTRAVENOUS | Status: AC
  Administered 2018-01-09: 100 mL
  Filled 2018-01-09: qty 100

## 2018-01-09 NOTE — ED Notes (Signed)
Pt departed in NAD, refused use of wheelchair.  

## 2018-01-09 NOTE — ED Notes (Signed)
Patient transported to CT 

## 2018-01-09 NOTE — Discharge Instructions (Signed)
Follow up with your doctor. Return here for worsening symptoms.

## 2018-03-13 ENCOUNTER — Emergency Department (HOSPITAL_COMMUNITY)
Admission: EM | Admit: 2018-03-13 | Discharge: 2018-03-13 | Disposition: A | Discharge status: Home / Self Care | Payer: Medicaid Other | Attending: Emergency Medicine | Admitting: Emergency Medicine

## 2018-03-13 ENCOUNTER — Emergency Department (HOSPITAL_COMMUNITY): Payer: Medicaid Other

## 2018-03-13 ENCOUNTER — Encounter (HOSPITAL_COMMUNITY): Payer: Self-pay

## 2018-03-13 ENCOUNTER — Other Ambulatory Visit: Payer: Self-pay

## 2018-03-13 DIAGNOSIS — J45909 Unspecified asthma, uncomplicated: Secondary | ICD-10-CM | POA: Insufficient documentation

## 2018-03-13 DIAGNOSIS — R52 Pain, unspecified: Secondary | ICD-10-CM

## 2018-03-13 DIAGNOSIS — F17210 Nicotine dependence, cigarettes, uncomplicated: Secondary | ICD-10-CM | POA: Insufficient documentation

## 2018-03-13 DIAGNOSIS — M5416 Radiculopathy, lumbar region: Secondary | ICD-10-CM | POA: Insufficient documentation

## 2018-03-13 DIAGNOSIS — M79605 Pain in left leg: Secondary | ICD-10-CM | POA: Diagnosis present

## 2018-03-13 DIAGNOSIS — M5410 Radiculopathy, site unspecified: Secondary | ICD-10-CM

## 2018-03-13 DIAGNOSIS — Z853 Personal history of malignant neoplasm of breast: Secondary | ICD-10-CM | POA: Insufficient documentation

## 2018-03-13 MED ORDER — IBUPROFEN 600 MG PO TABS: 600.0000 mg | ORAL_TABLET | Freq: Four times a day (QID) | ORAL | 0 refills | Status: DC | PRN

## 2018-03-13 MED ORDER — CYCLOBENZAPRINE HCL 10 MG PO TABS: 10.0000 mg | ORAL_TABLET | Freq: Three times a day (TID) | ORAL | 0 refills | Status: DC | PRN

## 2018-03-13 MED ORDER — PREDNISONE 20 MG PO TABS: ORAL_TABLET | ORAL | 0 refills | Status: DC

## 2018-03-13 NOTE — ED Provider Notes (Signed)
[facility] Provider Note   CSN: [id number] Arrival date & time: 03/13/18  1629     History   Chief Complaint Chief Complaint  Patient presents with  . Leg Pain    HPI BLIA TOTMAN is a 47 y.o. female.  HPI   47 year old female with history of obesity presenting for evaluation of leg pain.  Report recurrent pain to her left leg ongoing for the past several months.  For the past 2 to 3 days pain has intensified.  She described pain as a sharp shooting achy sensation starting her left lower back radiates towards her left thigh.  Pain worsening with walking up the steps and with movement and mildly improved with rest.  No specific treatment tried at home.  No associated fever, chills, lightheadedness, dizziness, abdominal pain, dysuria, hematuria, bowel bladder incontinence or saddle anesthesia.  No rash.  Denies any recent injury.  She was seen by her PCP yesterday for her leg pain.  She was giving a shot of pain medication which did help overnight but the pain returns.  No history of active cancer or IV drug use.  Pain is moderate in intensity currently.  Past Medical History:  Diagnosis Date  . Anxiety   . Asthma   . Breast cancer ([place])   . 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 Depression   . Myalgia   . Personal history of radiation therapy   . Seasonal allergies     Patient Active Problem List   Diagnosis Date Noted  . Depression 07/09/2016  . Back pain 05/06/2016  . Asthma 02/06/2016  . Asthma exacerbation 02/06/2016  . Emesis   . Morbid obesity ([place])   . Genetic testing 09/26/2015  . Cigarette smoker 09/21/2015  . Severe obesity (BMI >= 40) ([place]) 09/21/2015  . Dyspnea 09/20/2015  . Breast cancer of upper-inner quadrant of left female breast ([place]) 09/08/2015    Past Surgical History:  Procedure Laterality  Date  . ABDOMINAL HYSTERECTOMY    . BREAST LUMPECTOMY Left 09/2015  . RADIOACTIVE SEED GUIDED PARTIAL MASTECTOMY WITH AXILLARY SENTINEL LYMPH NODE BIOPSY Left 10/11/2015   Procedure: RADIOACTIVE SEED LOCALIZATION LEFT BREAST LUMPECTOMY WITH LEFT  AXILLARY SENTINEL LYMPH NODE BIOPSY;  Surgeon: Excell Seltzer, MD;  Location: [facility];  Service: General;  Laterality: Left;  . TUBAL LIGATION       OB History    Gravida  4   Para  3   Term  3   Preterm      AB  1   Living  3     SAB      TAB      Ectopic      Multiple      Live Births               Home Medications    Prior to Admission medications   Medication Sig Start Date End Date Taking? Authorizing Provider  albuterol (PROVENTIL) (2.5 MG/3ML) 0.083% nebulizer solution USE 1 VIAL BY MOUTH EVERY 6 HOURS AS NEEDED FOR SOB AND WHEEZING 02/06/17   [provider]  albuterol (VENTOLIN HFA) 108 (90 BASE) MCG/ACT inhaler Inhale 2 puffs into the lungs every 6 (six) hours as needed for wheezing or shortness of breath. Reported on 11/16/2015    [provider]  cyclobenzaprine (FLEXERIL) 10  MG tablet Take 10 mg by mouth 3 (three) times daily as needed for muscle spasms.    [provider]  gabapentin (NEURONTIN) 100 MG capsule Take 1 capsule (100 mg total) by mouth at bedtime. Patient not taking: Reported on 11/18/2017 10/31/16   Truitt Merle, MD  hydrocortisone cream 1 % Apply to affected area 2 times daily Patient not taking: Reported on 11/18/2017 07/04/17   Ripley Fraise, MD  hydrOXYzine (ATARAX/VISTARIL) 25 MG tablet Take 25 mg by mouth 2 (two) times daily. For anxiety 10/30/17   [provider]  meloxicam (MOBIC) 15 MG tablet Take 1 tablet (15 mg total) by mouth daily. Patient not taking: Reported on 01/05/2018 06/04/17   Maczis, Barth Kirks, PA-C  nabumetone (RELAFEN) 750 MG tablet Take 750 mg by mouth 2 (two) times daily. 10/30/17   [provider]    neomycin-polymyxin-hydrocortisone (CORTISPORIN) 3.5-10000-1 OTIC suspension Place 4 drops into both ears 4 (four) times daily. X 7 days Patient not taking: Reported on 7/62/8315 17/61/60   Delora Fuel, MD  omeprazole (PRILOSEC) 20 MG capsule Take 1 capsule (20 mg total) by mouth daily. Patient not taking: Reported on 01/05/2018 06/04/17   Maczis, Barth Kirks, PA-C  phentermine (ADIPEX-P) 37.5 MG tablet Take 37.5 mg by mouth every morning. 10/30/17   [provider]  polyethylene glycol (MIRALAX) packet Take 17 g by mouth daily. 10/31/17   Brunetta Genera, MD  tamoxifen (NOLVADEX) 20 MG tablet Take 1 tablet (20 mg total) by mouth daily. Patient not taking: Reported on 01/05/2018 11/04/17   Brunetta Genera, MD    Family History Family History  Problem Relation Age of Onset  . Diabetes Mother   . Diabetes Father   . Lung cancer Father 27       smoker  . Cancer Paternal Aunt        cervical  . Diabetes Brother   . Heart attack Paternal Grandmother        in her 35s  . Diabetes Brother        maternal half brother  . Asthma Brother   . Asthma Maternal Aunt     Social History Social History   Tobacco Use  . Smoking status: Current Every Day Smoker    Packs/day: 0.50    Years: 34.00    Pack years: 17.00    Types: Cigarettes  . Smokeless tobacco: Never Used  Substance Use Topics  . Alcohol use: No    Alcohol/week: 0.0 oz  . Drug use: No     Allergies   Patient has no known allergies.   Review of Systems Review of Systems  Constitutional: Negative for fever.  Musculoskeletal: Positive for back pain.  Skin: Negative for wound.  Neurological: Negative for numbness.     Physical Exam Updated Vital Signs BP 110/75 (BP Location: Right Arm)   Pulse 85   Temp 98.9 F (37.2 C) (Oral)   Resp 18   Ht 5\' 2"  (1.575 m)   Wt 102.1 kg (225 lb)   SpO2 99%   BMI 41.15 kg/m   Physical Exam  Constitutional: She appears well-developed and well-nourished. No  distress.  Moderately obese female sitting in the chair in no acute discomfort.  Nontoxic  HENT:  Head: Atraumatic.  Eyes: Conjunctivae are normal.  Neck: Neck supple.  Abdominal: Soft. There is no tenderness.  Genitourinary:  Genitourinary Comments: No CVA tenderness  Musculoskeletal: She exhibits tenderness (Tenderness along left lumbar paraspinal muscle and left thigh on  palpation.  Positive straight leg raise.  Patellar deep tendon reflex intact, no foot drop.  DP pulse palpable.  Leg compartment soft).  Neurological: She is alert.  Skin: No rash noted.  Psychiatric: She has a normal mood and affect.  Nursing note and vitals reviewed.    ED Treatments / Results  Labs (all labs ordered are listed, but only abnormal results are displayed) Labs Reviewed - No data to display  EKG None  Radiology Dg Knee Complete 4 Views Left  Result Date: 03/13/2018 CLINICAL DATA:  Left leg pain.  History of fibromyalgia. EXAM: LEFT KNEE - COMPLETE 4+ VIEW COMPARISON:  None. FINDINGS: No evidence of fracture, dislocation, or joint effusion. Slight femorotibial joint space narrowing. Soft tissues are unremarkable. IMPRESSION: No acute osseous abnormality of the left knee. Slight femorotibial joint space narrowing may reflect cartilaginous/chondral thinning. Electronically Signed   By: Ashley Royalty M.D.   On: 03/13/2018 19:26   Dg Femur Min 2 Views Left  Result Date: 03/13/2018 CLINICAL DATA:  Fibromyalgia.  Left leg pain. EXAM: LEFT FEMUR 2 VIEWS COMPARISON:  None. FINDINGS: No acute fracture or malalignment of the left femur. Smooth broad-based thickening of the medial cortex of the proximal left femoral diaphysis may reflect stigmata of a healed fracture. No aggressive osseous appearing lesions or soft tissue masses. IMPRESSION: No acute osseous abnormality of the left femur. Electronically Signed   By: Ashley Royalty M.D.   On: 03/13/2018 19:29    Procedures Procedures (including critical care  time)  Medications Ordered in ED Medications - No data to display   Initial Impression / Assessment and Plan / ED Course  I have reviewed the triage vital signs and the nursing notes.  Pertinent labs & imaging results that were available during my care of the patient were reviewed by me and considered in my medical decision making (see chart for details).     BP 110/75 (BP Location: Right Arm)   Pulse 85   Temp 98.9 F (37.2 C) (Oral)   Resp 18   Ht 5\' 2"  (1.575 m)   Wt 102.1 kg (225 lb)   SpO2 99%   BMI 41.15 kg/m    Final Clinical Impressions(s) / ED Diagnoses   Final diagnoses:  Radicular syndrome of left leg    ED Discharge Orders        Ordered    cyclobenzaprine (FLEXERIL) 10 MG tablet  3 times daily PRN     03/13/18 1958    ibuprofen (ADVIL,MOTRIN) 600 MG tablet  Every 6 hours PRN     03/13/18 1958    predniSONE (DELTASONE) 20 MG tablet     03/13/18 1958     7:56 PM Patient with radicular leg pain which has been recurrent and progressively worsening.  She is able to ambulate.  She is neurovascular intact.  Suspect sciatica given her obesity, and history of fibromyalgia's.  No red flags.  Leg compartment soft.  She will be discharged home with steroid, muscle relaxant, and anti-inflammatory medication.  Orthopedic referral given as needed.  Return precautions discussed.   Domenic Moras, PA-C 03/13/18 Patrecia Pour    Sherwood Gambler, MD 03/13/18 2102

## 2018-03-13 NOTE — ED Triage Notes (Signed)
Pt c/o pain in left leg. States she was seen by her PCP and sent here for XR.

## 2018-04-07 ENCOUNTER — Inpatient Hospital Stay: Payer: Medicaid Other | Attending: Hematology | Admitting: Hematology

## 2018-04-07 ENCOUNTER — Inpatient Hospital Stay: Payer: Medicaid Other

## 2019-07-09 ENCOUNTER — Other Ambulatory Visit: Payer: Self-pay | Admitting: Hematology

## 2019-07-09 DIAGNOSIS — Z853 Personal history of malignant neoplasm of breast: Secondary | ICD-10-CM

## 2019-07-09 DIAGNOSIS — Z9889 Other specified postprocedural states: Secondary | ICD-10-CM

## 2019-07-27 ENCOUNTER — Ambulatory Visit
Admission: RE | Admit: 2019-07-27 | Discharge: 2019-07-27 | Disposition: A | Discharge status: Home / Self Care | Payer: Self-pay | Source: Ambulatory Visit | Attending: Hematology | Admitting: Hematology

## 2019-07-27 ENCOUNTER — Other Ambulatory Visit: Payer: Self-pay

## 2019-07-27 DIAGNOSIS — Z853 Personal history of malignant neoplasm of breast: Secondary | ICD-10-CM

## 2019-07-27 DIAGNOSIS — Z9889 Other specified postprocedural states: Secondary | ICD-10-CM

## 2019-11-23 ENCOUNTER — Emergency Department (HOSPITAL_COMMUNITY)
Admission: EM | Admit: 2019-11-23 | Discharge: 2019-11-24 | Disposition: A | Discharge status: Home / Self Care | Payer: Medicare Other | Attending: Emergency Medicine | Admitting: Emergency Medicine

## 2019-11-23 ENCOUNTER — Other Ambulatory Visit: Payer: Self-pay

## 2019-11-23 ENCOUNTER — Encounter (HOSPITAL_COMMUNITY): Payer: Self-pay | Admitting: Emergency Medicine

## 2019-11-23 DIAGNOSIS — L02211 Cutaneous abscess of abdominal wall: Secondary | ICD-10-CM | POA: Insufficient documentation

## 2019-11-23 DIAGNOSIS — Z20822 Contact with and (suspected) exposure to covid-19: Secondary | ICD-10-CM | POA: Insufficient documentation

## 2019-11-23 DIAGNOSIS — F17210 Nicotine dependence, cigarettes, uncomplicated: Secondary | ICD-10-CM | POA: Diagnosis not present

## 2019-11-23 DIAGNOSIS — M545 Low back pain, unspecified: Secondary | ICD-10-CM

## 2019-11-23 DIAGNOSIS — M549 Dorsalgia, unspecified: Secondary | ICD-10-CM | POA: Diagnosis present

## 2019-11-23 LAB — COMPREHENSIVE METABOLIC PANEL
ALT: 22 U/L (ref 0–44)
AST: 18 U/L (ref 15–41)
Albumin: 3.6 g/dL (ref 3.5–5.0)
Alkaline Phosphatase: 92 U/L (ref 38–126)
Anion gap: 8 (ref 5–15)
BUN: 12 mg/dL (ref 6–20)
CO2: 22 mmol/L (ref 22–32)
Calcium: 9 mg/dL (ref 8.9–10.3)
Chloride: 104 mmol/L (ref 98–111)
Creatinine, Ser: 1.3 mg/dL — ABNORMAL HIGH (ref 0.44–1.00)
GFR calc Af Amer: 57 mL/min — ABNORMAL LOW (ref >60)
GFR calc non Af Amer: 49 mL/min — ABNORMAL LOW (ref >60)
Glucose, Bld: 114 mg/dL — ABNORMAL HIGH (ref 70–99)
Potassium: 4.1 mmol/L (ref 3.5–5.1)
Sodium: 134 mmol/L — ABNORMAL LOW (ref 135–145)
Total Bilirubin: 1 mg/dL (ref 0.3–1.2)
Total Protein: 7.3 g/dL (ref 6.5–8.1)

## 2019-11-23 LAB — URINALYSIS, ROUTINE W REFLEX MICROSCOPIC
Bilirubin Urine: NEGATIVE
Glucose, UA: NEGATIVE mg/dL
Hgb urine dipstick: NEGATIVE
Ketones, ur: NEGATIVE mg/dL
Nitrite: NEGATIVE
Protein, ur: NEGATIVE mg/dL
Specific Gravity, Urine: 1.016 (ref 1.005–1.030)
pH: 5 (ref 5.0–8.0)

## 2019-11-23 LAB — CBC
HCT: 39.2 % (ref 36.0–46.0)
Hemoglobin: 13.4 g/dL (ref 12.0–15.0)
MCH: 28.9 pg (ref 26.0–34.0)
MCHC: 34.2 g/dL (ref 30.0–36.0)
MCV: 84.5 fL (ref 80.0–100.0)
Platelets: 284 10*3/uL (ref 150–400)
RBC: 4.64 MIL/uL (ref 3.87–5.11)
RDW: 13.1 % (ref 11.5–15.5)
WBC: 12.5 10*3/uL — ABNORMAL HIGH (ref 4.0–10.5)
nRBC: 0 % (ref 0.0–0.2)

## 2019-11-23 MED ORDER — OXYCODONE-ACETAMINOPHEN 5-325 MG PO TABS
1.0000 | ORAL_TABLET | ORAL | Status: DC | PRN
  Administered 2019-11-23: 22:00:00 1 via ORAL
  Filled 2019-11-23: qty 1

## 2019-11-23 NOTE — ED Triage Notes (Signed)
Pt presents to ED from home POV. Pt c/o sudden onset back pain/spasm. Pt took flexeril, aspirin, ibuprofen 2h ago. Pt states reports dark urine, difficulty beginning to urinate.

## 2019-11-24 ENCOUNTER — Emergency Department (HOSPITAL_COMMUNITY): Payer: Medicare Other

## 2019-11-24 ENCOUNTER — Encounter (HOSPITAL_COMMUNITY): Payer: Self-pay | Admitting: Radiology

## 2019-11-24 DIAGNOSIS — M545 Low back pain: Secondary | ICD-10-CM | POA: Diagnosis not present

## 2019-11-24 LAB — SARS CORONAVIRUS 2 (TAT 6-24 HRS): SARS Coronavirus 2: NEGATIVE

## 2019-11-24 MED ORDER — IOHEXOL 300 MG/ML  SOLN
100.0000 mL | Freq: Once | INTRAMUSCULAR | Status: AC | PRN
  Administered 2019-11-24: 02:00:00 100 mL via INTRAVENOUS

## 2019-11-24 MED ORDER — FENTANYL CITRATE (PF) 100 MCG/2ML IJ SOLN
50.0000 ug | Freq: Once | INTRAMUSCULAR | Status: AC
Start: 2019-11-24 — End: 2019-11-24
  Administered 2019-11-24: 01:00:00 50 ug via INTRAVENOUS
  Filled 2019-11-24: qty 2

## 2019-11-24 MED ORDER — ONDANSETRON HCL 4 MG/2ML IJ SOLN
4.0000 mg | Freq: Once | INTRAMUSCULAR | Status: AC
  Administered 2019-11-24: 01:00:00 4 mg via INTRAVENOUS
  Filled 2019-11-24: qty 2

## 2019-11-24 MED ORDER — LACTATED RINGERS IV BOLUS
500.0000 mL | Freq: Once | INTRAVENOUS | Status: AC
  Administered 2019-11-24: 06:00:00 500 mL via INTRAVENOUS

## 2019-11-24 MED ORDER — METHOCARBAMOL 500 MG PO TABS: 500.0000 mg | ORAL_TABLET | Freq: Two times a day (BID) | ORAL | 0 refills | Status: DC

## 2019-11-24 MED ORDER — METHOCARBAMOL 500 MG PO TABS
500.0000 mg | ORAL_TABLET | Freq: Once | ORAL | Status: AC
  Administered 2019-11-24: 06:00:00 500 mg via ORAL
  Filled 2019-11-24: qty 1

## 2019-11-24 MED ORDER — LACTATED RINGERS IV BOLUS
1000.0000 mL | Freq: Once | INTRAVENOUS | Status: AC
Start: 2019-11-24 — End: 2019-11-24
  Administered 2019-11-24: 01:00:00 1000 mL via INTRAVENOUS

## 2019-11-24 NOTE — ED Provider Notes (Signed)
Emergency Department Provider Note   I have reviewed the triage vital signs and the nursing notes.   HISTORY  Chief Complaint Back Pain   HPI Yolanda White is a 49 y.o. female with medical problems document below who presents to the emergency department today secondary to back pain.  Patient states that she had a headache and change in taste changes smell about a week ago she started have some dizziness and difficulty walking secondary to weakness from decreased p.o. intake over the last couple days.  She went deal with that okay but then today she was walking to her bedroom and she had acute onset of left-sided mid lateral back pain unlikely that she had before.  She states her urine has been darker recently but she attributed to drinking less.  No fevers.  No cough.  She states that she tried multiple things to help the pain go away such as heat, flexeril and over-the-counter medications and did not so she presents here for further evaluation.  No trauma.   No other associated or modifying symptoms.    Past Medical History:  Diagnosis Date  . Anxiety   . Asthma   . Breast cancer ([place])   . 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 Depression   . Myalgia   . Personal history of radiation therapy   . Seasonal allergies     Patient Active Problem List   Diagnosis Date Noted  . Depression 07/09/2016  . Back pain 05/06/2016  . Asthma 02/06/2016  . Asthma exacerbation 02/06/2016  . Emesis   . Morbid obesity ([place])   . Genetic testing 09/26/2015  . Cigarette smoker 09/21/2015  . Severe obesity (BMI >= 40) ([place]) 09/21/2015  . Dyspnea 09/20/2015  . 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 ABDOMINAL HYSTERECTOMY    . BREAST LUMPECTOMY Left 09/2015  . RADIOACTIVE SEED  GUIDED PARTIAL MASTECTOMY WITH AXILLARY SENTINEL LYMPH NODE BIOPSY Left 10/11/2015   Procedure: RADIOACTIVE SEED LOCALIZATION LEFT BREAST LUMPECTOMY WITH LEFT  AXILLARY SENTINEL LYMPH NODE BIOPSY;  Surgeon: Excell Seltzer, MD;  Location: [facility];  Service: General;  Laterality: Left;  . TUBAL LIGATION      Current Outpatient Rx  . Order #: [id number] Class: Historical Med  . Order #: [id number] Class: Historical Med  . Order #: [id number] Class: Historical Med  . Order #: [id number] Class: Historical Med  . Order #: [id number] Class: Normal    Allergies Patient has no known allergies.  Family History  Problem Relation Age of Onset  . Diabetes Mother   . Diabetes Father   . Lung cancer Father 62       smoker  . Cancer Paternal Aunt        cervical  . Diabetes Brother   . Heart attack Paternal Grandmother        in her 28s  . Diabetes Brother        maternal half brother  . Asthma Brother   . Asthma Maternal Aunt     Social History Social History   Tobacco Use  . Smoking status: Current Every Day Smoker    Packs/day: 0.50    Years: 34.00    Pack years: 17.00    Types: Cigarettes  . Smokeless tobacco: Never Used  Substance Use Topics  . Alcohol use: No    Alcohol/week: 0.0 standard drinks  . Drug use: No    Review of Systems  All other systems negative except as documented in the HPI. All pertinent positives and negatives as reviewed in the HPI. ____________________________________________   PHYSICAL EXAM:  VITAL SIGNS: ED Triage Vitals  Enc Vitals Group     BP 11/23/19 2137 126/84     Pulse Rate 11/23/19 2137 91     Resp 11/23/19 2137 20     Temp 11/23/19 2137 98.3 F (36.8 C)     Temp Source 11/23/19 2137 Oral     SpO2 11/23/19 2137 98 %     Weight --      Height --      Head Circumference --      Peak Flow --      Pain Score 11/23/19 2138 7     Pain Loc --      Pain Edu? --      Excl. in [place]? --     Constitutional: Alert and  oriented. Well appearing and in no acute distress. Eyes: Conjunctivae are normal. PERRL. EOMI. Head: Atraumatic. Nose: No congestion/rhinnorhea. Mouth/Throat: Mucous membranes are moist.  Oropharynx non-erythematous. Neck: No stridor.  No meningeal signs.   Cardiovascular: Normal rate, regular rhythm. Good peripheral circulation. Grossly normal heart sounds.   Respiratory: Normal respiratory effort.  No retractions. Lungs CTAB. Gastrointestinal: Soft and nontender. No distention.  Musculoskeletal: ttp to left paraspinal area in distal thoracic spine. No lower extremity tenderness nor edema. No gross deformities of extremities. Neurologic:  Normal speech and language. No gross focal neurologic deficits are appreciated.  Dorsiflex, plantarflex symmetrically bilaterally.  Sensation to light touch intact in lower extremities and no saddle anesthesia. Skin:  Skin is warm, dry and intact. No rash noted.   ____________________________________________   LABS (all labs ordered are listed, but only abnormal results are displayed)  Labs Reviewed  COMPREHENSIVE METABOLIC PANEL - Abnormal; Notable for the following components:      Result Value   Sodium 134 (*)    Glucose, Bld 114 (*)    Creatinine, Ser 1.30 (*)    GFR calc non Af Amer 49 (*)    GFR calc Af Amer 57 (*)    All other components within normal limits  CBC - Abnormal; Notable for the following components:   WBC 12.5 (*)    All other components within normal limits  URINALYSIS, ROUTINE W REFLEX MICROSCOPIC - Abnormal; Notable for the following components:   APPearance HAZY (*)    Leukocytes,Ua TRACE (*)    Bacteria, UA RARE (*)    All other components within normal limits  URINE CULTURE  SARS CORONAVIRUS 2 (TAT 6-24 HRS)   ____________________________________________  EKG   EKG Interpretation  Date/Time:    Ventricular Rate:    PR Interval:    QRS Duration:   QT Interval:    QTC Calculation:   R Axis:     Text  Interpretation:         ____________________________________________  RADIOLOGY  CT ABDOMEN PELVIS W CONTRAST  Result Date: 11/24/2019 CLINICAL DATA:  Abdominal abscess, infection suspected, back pain began yesterday EXAM: CT ABDOMEN AND PELVIS WITH CONTRAST TECHNIQUE: Multidetector CT imaging of the abdomen and pelvis was performed using the standard protocol following bolus administration of intravenous contrast. CONTRAST:  152mL OMNIPAQUE IOHEXOL 300 MG/ML  SOLN COMPARISON:  Abdominal radiograph [id number], CT renal colic [id number], CT a  chest, abdomen and pelvis 01/09/2018 FINDINGS: Lower chest: Bandlike opacities in lung bases compatible with atelectasis. Normal heart size. No pericardial effusion. Hepatobiliary: No focal liver abnormality is seen. No gallstones, gallbladder wall thickening, or biliary dilatation. Pancreas: Unremarkable. No pancreatic ductal dilatation or surrounding inflammatory changes. Spleen: Normal in size without focal abnormality. Adrenals/Urinary Tract: Adrenal glands are unremarkable. Kidneys are normal, without renal calculi, focal lesion, or hydronephrosis. Bladder is unremarkable. Stomach/Bowel: Distal esophagus, stomach and duodenal sweep are unremarkable. No small bowel wall thickening or dilatation. No evidence of obstruction. A normal appendix is visualized. No colonic dilatation or wall thickening. Vascular/Lymphatic: The aorta is normal caliber. No suspicious or enlarged lymph nodes in the included lymphatic chains. Reproductive: Uterus is surgically absent. Retained ovaries. No concerning adnexal lesions. Other: No free fluid. No free air. No bowel containing hernias. Musculoskeletal: Minimal degenerative changes in the spine. IMPRESSION: 1. No findings to explain the patient's abdominal pain. 2. Bandlike opacities in lung bases compatible with atelectasis. Electronically Signed   By: Lovena Le M.D.   On: 11/24/2019 02:54     ____________________________________________   PROCEDURES  Procedure(s) performed:   Procedures   ____________________________________________   INITIAL IMPRESSION / ASSESSMENT AND PLAN / ED COURSE  Eval for Covid for the symptoms that she had been having for the last week but there main reason for coming here is the back pain.  Consider possible renal infarct versus diverticulitis versus kidney stone versus muscular cause from dehydration.  Will hydrate, pain medication CT scan and work-up as above.  wokup unremarkable. Likely muscle spasm from dehydration. covid still pending. Symptoms improved. Will give a little more fluid and muscle relaxer, stable for discharge otherwise.      Pertinent labs & imaging results that were available during my care of the patient were reviewed by me and considered in my medical decision making (see chart for details).   A medical screening exam was performed and I feel the patient has had an appropriate workup for their chief complaint at this time and likelihood of emergent condition existing is low. They have been counseled on decision, discharge, follow up and which symptoms necessitate immediate return to the emergency department. They or their family verbally stated understanding and agreement with plan and discharged in stable condition.   ____________________________________________  FINAL CLINICAL IMPRESSION(S) / ED DIAGNOSES  Final diagnoses:  Acute left-sided low back pain without sciatica     MEDICATIONS GIVEN DURING THIS VISIT:  Medications  oxyCODONE-acetaminophen (PERCOCET/ROXICET) 5-325 MG per tablet 1 tablet (1 tablet Oral Given 11/23/19 2140)  lactated ringers bolus 1,000 mL (0 mLs Intravenous Stopped 11/24/19 0257)  fentaNYL (SUBLIMAZE) injection 50 mcg (50 mcg Intravenous Given 11/24/19 0120)  ondansetron (ZOFRAN) injection 4 mg (4 mg Intravenous Given 11/24/19 0120)  iohexol (OMNIPAQUE) 300 MG/ML solution 100 mL (100 mLs  Intravenous Contrast Given 11/24/19 0222)  lactated ringers bolus 500 mL (500 mLs Intravenous New Bag/Given 11/24/19 0617)  methocarbamol (ROBAXIN) tablet 500 mg (500 mg Oral Given 11/24/19 0616)     NEW OUTPATIENT MEDICATIONS STARTED DURING THIS VISIT:  New Prescriptions   METHOCARBAMOL (ROBAXIN) 500 MG TABLET    Take 1 tablet (500 mg total) by mouth 2 (two) times daily.    Note:  This note was prepared with assistance of Dragon voice recognition software. Occasional wrong-word or sound-a-like substitutions may have occurred due to the inherent limitations of voice recognition software.   Jamile Rekowski, Corene Cornea, MD 11/24/19 [phone]

## 2019-11-24 NOTE — ED Notes (Signed)
Pt d/c home per MD order. Discharge summary reviewed, pt verbalizes understanding. Reports discharge ride home.  

## 2019-11-25 LAB — URINE CULTURE

## 2020-10-25 ENCOUNTER — Ambulatory Visit: Payer: Medicaid Other | Admitting: Obstetrics & Gynecology

## 2020-11-10 ENCOUNTER — Encounter: Payer: Self-pay | Admitting: Obstetrics

## 2020-11-10 ENCOUNTER — Other Ambulatory Visit: Payer: Self-pay | Admitting: Hematology

## 2020-11-10 ENCOUNTER — Other Ambulatory Visit: Payer: Self-pay | Admitting: Obstetrics

## 2020-11-10 ENCOUNTER — Ambulatory Visit (INDEPENDENT_AMBULATORY_CARE_PROVIDER_SITE_OTHER): Payer: Medicare Other | Admitting: Obstetrics

## 2020-11-10 ENCOUNTER — Other Ambulatory Visit: Payer: Self-pay

## 2020-11-10 VITALS — BP 126/90 | HR 67 | Ht 62.0 in | Wt 259.9 lb

## 2020-11-10 DIAGNOSIS — Z90710 Acquired absence of both cervix and uterus: Secondary | ICD-10-CM | POA: Diagnosis not present

## 2020-11-10 DIAGNOSIS — F17200 Nicotine dependence, unspecified, uncomplicated: Secondary | ICD-10-CM

## 2020-11-10 DIAGNOSIS — Z113 Encounter for screening for infections with a predominantly sexual mode of transmission: Secondary | ICD-10-CM | POA: Diagnosis not present

## 2020-11-10 DIAGNOSIS — Z01419 Encounter for gynecological examination (general) (routine) without abnormal findings: Secondary | ICD-10-CM | POA: Diagnosis not present

## 2020-11-10 DIAGNOSIS — Z6841 Body Mass Index (BMI) 40.0 and over, adult: Secondary | ICD-10-CM

## 2020-11-10 DIAGNOSIS — Z1231 Encounter for screening mammogram for malignant neoplasm of breast: Secondary | ICD-10-CM

## 2020-11-10 NOTE — Progress Notes (Signed)
Pt presents for annual and all STD testing. Pt reports vaginal dryness. Pt also c/o bumps on abdominal area causes "sink holes" once they burst. Hx of breast ca L breast lumpectomy Dec 2016  Mammogram due - normal mgm 06/2019

## 2020-11-10 NOTE — Progress Notes (Signed)
Subjective:        Yolanda White is a 50 y.o. female here for a routine exam.  Current complaints: Vaginal dryness.    Personal health questionnaire:  Is patient Ashkenazi Jewish, have a family history of breast and/or ovarian cancer: no Is there a family history of uterine cancer diagnosed at age < 63, gastrointestinal cancer, urinary tract cancer, family member who is a Field seismologist syndrome-associated carrier: yes Is the patient overweight and hypertensive, family history of diabetes, personal history of gestational diabetes, preeclampsia or PCOS: no Is patient over 5, have PCOS,  family history of premature CHD under age 23, diabetes, smoke, have hypertension or peripheral artery disease:  no At any time, has a partner hit, kicked or otherwise hurt or frightened you?: no Over the past 2 weeks, have you felt down, depressed or hopeless?: no Over the past 2 weeks, have you felt little interest or pleasure in doing things?:no   Gynecologic History No LMP recorded. Patient has had a hysterectomy. Contraception: status post hysterectomy Last Pap: 2010. Results were: normal Last mammogram: 07-27-2019. Results were: normal  Obstetric History OB History  Gravida Para Term Preterm AB Living  4 3 3   1 3   SAB IAB Ectopic Multiple Live Births               # Outcome Date GA Lbr Len/2nd Weight Sex Delivery Anes PTL Lv  4 Term           3 Term           2 Term           1 AB             Past Medical History:  Diagnosis Date  . Anxiety   . Asthma   . Breast cancer ([place])   . Breast cancer of upper-inner quadrant of left female breast ([place]) 09/08/2015  . 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 Depression   . Myalgia   . Personal history of radiation therapy   . Seasonal allergies     Past Surgical History:  Procedure Laterality Date  . ABDOMINAL HYSTERECTOMY    . BREAST LUMPECTOMY Left 09/2015  . RADIOACTIVE SEED GUIDED PARTIAL MASTECTOMY WITH  AXILLARY SENTINEL LYMPH NODE BIOPSY Left 10/11/2015   Procedure: RADIOACTIVE SEED LOCALIZATION LEFT BREAST LUMPECTOMY WITH LEFT  AXILLARY SENTINEL LYMPH NODE BIOPSY;  Surgeon: Excell Seltzer, MD;  Location: [facility];  Service: General;  Laterality: Left;  . TUBAL LIGATION       Current Outpatient Medications:  .  albuterol (VENTOLIN HFA) 108 (90 Base) MCG/ACT inhaler, Inhale 2 puffs into the lungs every 6 (six) hours as needed for wheezing or shortness of breath. Reported on 11/16/2015, Disp: , Rfl:  .  amoxicillin (AMOXIL) 875 MG tablet, Take 875 mg by mouth 2 (two) times daily., Disp: , Rfl:  .  azelastine (ASTELIN) 0.1 % nasal spray, Place into both nostrils 2 (two) times daily. Use in each nostril as directed, Disp: , Rfl:  .  cetirizine (ZYRTEC) 10 MG tablet, Take by mouth., Disp: , Rfl:  .  cyclobenzaprine (FLEXERIL) 10 MG tablet, Take 10 mg by mouth 2 (two) times daily., Disp: , Rfl:  .  diazepam (VALIUM) 5 MG tablet, Take 5 mg by mouth every 6 (six) hours as needed for anxiety., Disp: , Rfl:  .  fluticasone (FLONASE) 50 MCG/ACT nasal spray, Place 1 spray  into both nostrils daily., Disp: , Rfl:  .  INCRUSE ELLIPTA 62.5 MCG/INH AEPB, Inhale 1 puff into the lungs daily. , Disp: , Rfl:  .  metroNIDAZOLE (FLAGYL) 500 MG tablet, Take 500 mg by mouth 3 (three) times daily., Disp: , Rfl:  .  nabumetone (RELAFEN) 750 MG tablet, Take 750 mg by mouth 2 (two) times daily., Disp: , Rfl: 2 .  ondansetron (ZOFRAN-ODT) 4 MG disintegrating tablet, Take 4 mg by mouth every 8 (eight) hours as needed., Disp: , Rfl:  .  predniSONE (DELTASONE) 20 MG tablet, Take 20 mg by mouth daily with breakfast., Disp: , Rfl:  .  methocarbamol (ROBAXIN) 500 MG tablet, Take 1 tablet (500 mg total) by mouth 2 (two) times daily. (Patient not taking: Reported on 11/10/2020), Disp: 20 tablet, Rfl: 0 No Known Allergies  Social History   Tobacco Use  . Smoking status: Current Every Day Smoker    Packs/day:  0.50    Years: 34.00    Pack years: 17.00    Types: Cigarettes  . Smokeless tobacco: Never Used  Substance Use Topics  . Alcohol use: No    Alcohol/week: 0.0 standard drinks    Family History  Problem Relation Age of Onset  . Diabetes Mother   . Diabetes Father   . Lung cancer Father 46       smoker  . Cancer Paternal Aunt        cervical  . Diabetes Brother   . Heart attack Paternal Grandmother        in her 60s  . Diabetes Brother        maternal half brother  . Asthma Brother   . Asthma Maternal Aunt       Review of Systems  Constitutional: negative for fatigue and weight loss Respiratory: negative for cough and wheezing Cardiovascular: negative for chest pain, fatigue and palpitations Gastrointestinal: negative for abdominal pain and change in bowel habits Musculoskeletal:negative for myalgias Neurological: negative for gait problems and tremors Behavioral/Psych: negative for abusive relationship, depression Endocrine: negative for temperature intolerance    Genitourinary:negative for abnormal menstrual periods, genital lesions, hot flashes, sexual problems and vaginal discharge. Positive for vaginal dryness Integument/breast: negative for breast lump, breast tenderness, nipple discharge and skin lesion(s)    Objective:       BP 126/90   Pulse 67   Ht 5\' 2"  (1.575 m)   Wt 259 lb 14.4 oz (117.9 kg)   BMI 47.54 kg/m  General:   alert and no distress  Skin:   no rash or abnormalities  Lungs:   clear to auscultation bilaterally  Heart:   regular rate and rhythm, S1, S2 normal, no murmur, click, rub or gallop  Breasts:   normal without suspicious masses, skin or nipple changes or axillary nodes  Abdomen:  normal findings: no organomegaly, soft, non-tender and no hernia  Pelvis:  External genitalia: normal general appearance Urinary system: urethral meatus normal and bladder without fullness, nontender Vaginal: normal without tenderness, induration or  masses Cervix: absent Adnexa: normal bimanual exam Uterus: absent   Lab Review Urine pregnancy test Labs reviewed yes Radiologic studies reviewed yes  50% of 20 min visit spent on counseling and coordination of care.   Assessment:     1. Encounter for well woman exam with routine gynecological exam - doing well  2. Status post total abdominal hysterectomy  3. Screening for STD (sexually transmitted disease) Rx: - [facility] - MM Digital Screening; Future - HIV Antibody (  routine testing w rflx)  4. Class 3 severe obesity due to excess calories without serious comorbidity with body mass index (BMI) of 45.0 to 49.9 in adult [facility]) - program of caloric reduction, exercise and behavioral modification recommended  5. Tobacco dependence - cessation with the aid of medication and behavioral modification recommended   Plan:    Education reviewed: calcium supplements, depression evaluation, low fat, low cholesterol diet, safe sex/STD prevention, self breast exams, smoking cessation and weight bearing exercise. Mammogram ordered. Follow up in: 1 year.    Orders Placed This Encounter  Procedures  . MM Digital Screening    Standing Status:   Future    Standing Expiration Date:   11/10/2021    Order Specific Question:   Reason for Exam (SYMPTOM  OR DIAGNOSIS REQUIRED)    Answer:   Screening    Order Specific Question:   Is the patient pregnant?    Answer:   No    Order Specific Question:   Preferred imaging location?    Answer:   [facility]  . [facility]  . HIV Antibody (routine testing w rflx)    Shelly Bombard, MD 11/10/2020 10:50 AM

## 2020-11-11 LAB — HIV ANTIBODY (ROUTINE TESTING W REFLEX): HIV Screen 4th Generation wRfx: NONREACTIVE

## 2020-11-11 LAB — RPR: RPR Ser Ql: NONREACTIVE

## 2020-12-25 ENCOUNTER — Other Ambulatory Visit: Payer: Self-pay

## 2020-12-25 ENCOUNTER — Ambulatory Visit
Admission: RE | Admit: 2020-12-25 | Discharge: 2020-12-25 | Disposition: A | Discharge status: Home / Self Care | Payer: Medicare Other | Source: Ambulatory Visit | Attending: Obstetrics | Admitting: Obstetrics

## 2020-12-25 DIAGNOSIS — Z1231 Encounter for screening mammogram for malignant neoplasm of breast: Secondary | ICD-10-CM

## 2021-04-18 ENCOUNTER — Other Ambulatory Visit: Payer: Self-pay | Admitting: Internal Medicine

## 2021-04-19 LAB — COMPLETE METABOLIC PANEL WITH GFR
AG Ratio: 1.5 (calc) (ref 1.0–2.5)
ALT: 47 U/L — ABNORMAL HIGH (ref 6–29)
AST: 33 U/L (ref 10–35)
Albumin: 4.1 g/dL (ref 3.6–5.1)
Alkaline phosphatase (APISO): 105 U/L (ref 31–125)
BUN/Creatinine Ratio: 11 (calc) (ref 6–22)
BUN: 12 mg/dL (ref 7–25)
CO2: 22 mmol/L (ref 20–32)
Calcium: 9 mg/dL (ref 8.6–10.2)
Chloride: 104 mmol/L (ref 98–110)
Creat: 1.12 mg/dL — ABNORMAL HIGH (ref 0.50–1.10)
GFR, Est African American: 67 mL/min/{1.73_m2} (ref >=60)
GFR, Est Non African American: 58 mL/min/{1.73_m2} — ABNORMAL LOW (ref >=60)
Globulin: 2.7 g/dL (calc) (ref 1.9–3.7)
Glucose, Bld: 91 mg/dL (ref 65–99)
Potassium: 3.9 mmol/L (ref 3.5–5.3)
Sodium: 138 mmol/L (ref 135–146)
Total Bilirubin: 0.8 mg/dL (ref 0.2–1.2)
Total Protein: 6.8 g/dL (ref 6.1–8.1)

## 2021-04-19 LAB — LIPID PANEL
Cholesterol: 190 mg/dL (ref <200)
HDL: 36 mg/dL — ABNORMAL LOW (ref >=50)
LDL Cholesterol (Calc): 118 mg/dL (calc) — ABNORMAL HIGH
Non-HDL Cholesterol (Calc): 154 mg/dL (calc) — ABNORMAL HIGH (ref <130)
Total CHOL/HDL Ratio: 5.3 (calc) — ABNORMAL HIGH (ref <5.0)
Triglycerides: 239 mg/dL — ABNORMAL HIGH (ref <150)

## 2021-04-19 LAB — CBC
HCT: 39.1 % (ref 35.0–45.0)
Hemoglobin: 13.2 g/dL (ref 11.7–15.5)
MCH: 28.5 pg (ref 27.0–33.0)
MCHC: 33.8 g/dL (ref 32.0–36.0)
MCV: 84.4 fL (ref 80.0–100.0)
MPV: 11.6 fL (ref 7.5–12.5)
Platelets: 232 10*3/uL (ref 140–400)
RBC: 4.63 10*6/uL (ref 3.80–5.10)
RDW: 13.5 % (ref 11.0–15.0)
WBC: 7.7 10*3/uL (ref 3.8–10.8)

## 2021-04-19 LAB — HEMOGLOBIN A1C W/OUT EAG: Hgb A1c MFr Bld: 6 % of total Hgb — ABNORMAL HIGH (ref <5.7)

## 2021-04-19 LAB — VITAMIN D 25 HYDROXY (VIT D DEFICIENCY, FRACTURES): Vit D, 25-Hydroxy: 27 ng/mL — ABNORMAL LOW (ref 30–100)

## 2021-04-19 LAB — TSH: TSH: 6.76 mIU/L — ABNORMAL HIGH

## 2022-02-05 ENCOUNTER — Emergency Department (HOSPITAL_COMMUNITY)
Admission: EM | Admit: 2022-02-05 | Discharge: 2022-02-05 | Disposition: A | Discharge status: Home / Self Care | Payer: Medicare Other | Attending: Emergency Medicine | Admitting: Emergency Medicine

## 2022-02-05 ENCOUNTER — Encounter (HOSPITAL_COMMUNITY): Payer: Self-pay | Admitting: Emergency Medicine

## 2022-02-05 ENCOUNTER — Emergency Department (HOSPITAL_COMMUNITY): Payer: Medicare Other

## 2022-02-05 ENCOUNTER — Emergency Department (HOSPITAL_BASED_OUTPATIENT_CLINIC_OR_DEPARTMENT_OTHER): Payer: Medicare Other

## 2022-02-05 DIAGNOSIS — M25572 Pain in left ankle and joints of left foot: Secondary | ICD-10-CM | POA: Diagnosis not present

## 2022-02-05 DIAGNOSIS — M79604 Pain in right leg: Secondary | ICD-10-CM

## 2022-02-05 DIAGNOSIS — M545 Low back pain, unspecified: Secondary | ICD-10-CM | POA: Insufficient documentation

## 2022-02-05 DIAGNOSIS — Z7951 Long term (current) use of inhaled steroids: Secondary | ICD-10-CM | POA: Diagnosis not present

## 2022-02-05 DIAGNOSIS — J45909 Unspecified asthma, uncomplicated: Secondary | ICD-10-CM | POA: Insufficient documentation

## 2022-02-05 DIAGNOSIS — Z853 Personal history of malignant neoplasm of breast: Secondary | ICD-10-CM | POA: Diagnosis not present

## 2022-02-05 NOTE — ED Provider Triage Note (Signed)
Emergency Medicine Provider Triage Evaluation Note ? ?Yolanda White , a 51 y.o. female  was evaluated in triage.  Pt complains of right thigh pain and left ankle pain.  Patient reports that right foot pain has been present for the last month and has been getting progressively worse.  Patient states that last week she passed out due to the pain.  Patient states that yesterday the pain caused her to fall landing on her left side.  After landing on her left side patient has had left ankle pain and swelling. ? ?Patient denies any numbness, weakness, color change, wound, pallor. ? ?Review of Systems  ?Positive: Myalgias, arthralgias, joint swelling ?Negative: See above ? ?Physical Exam  ?BP (!) 147/92 (BP Location: Right Arm)   Pulse 86   Temp 98.5 ?F (36.9 ?C)   Resp 18   SpO2 99%  ?Gen:   Awake, no distress   ?Resp:  Normal effort  ?MSK:   Moves extremities without difficulty  ?Other:  +2 DP pulse bilaterally.  Cap refill less than 2 seconds in all digits of bilateral feet.  Sensation intact to all digits of bilateral feet.  Patient has swelling and tenderness to left lateral malleus with decreased range of motion to ankle secondary to complaints of pain.  No erythema or warmth noted to left ankle.  Diffuse tenderness to right thigh.  No swelling or tenderness to bilateral calfs. ? ?Medical Decision Making  ?Medically screening exam initiated at 9:47 AM.  Appropriate orders placed.  Yolanda White was informed that the remainder of the evaluation will be completed by another provider, this initial triage assessment does not replace that evaluation, and the importance of remaining in the ED until their evaluation is complete. ? ?We will obtain DVT study of right upper extremity due to patient's persistent pain as well as obtain x-ray imaging to look for acute osseous abnormality.  Will obtain x-ray imaging of left ankle to look for acute osseous abnormality. ?  ?Loni Beckwith, PA-C ?02/05/22 [id number] ? ?

## 2022-02-05 NOTE — Progress Notes (Signed)
Lower extremity venous has been completed.  ? ?Preliminary results in CV Proc.  ? ?Yolanda White ?02/05/2022 10:41 AM    ?

## 2022-02-05 NOTE — Discharge Instructions (Signed)
You came to the emergency department today to be evaluated for your right leg pain, left ankle pain, and lumbar back pain.  The x-ray of your left ankle did not show any broken bones or dislocations.  Due to the swelling present you may have suffered a ankle sprain.  Due to this you were placed in a Cam walking boot and given crutches.  Please use the crutches to remain nonweightbearing until he can follow-up with an orthopedic provider. ? ?The ultrasound study of your right leg did not show any blood clots.  The x-ray of your right thigh did not show any broken bones or dislocations. ? ?Due to your lumbar back pain and right leg pain please follow-up with your orthopedic provider for further evaluation.  Due to the episode of passing out please follow-up with your primary care provider for repeat evaluation. ? ?Get help right away if: ?You develop new bowel or bladder control problems. ?You have unusual weakness or numbness in your arms or legs. ?You feel faint. ?Your foot, leg, toes, or ankle: ?Tingles or becomes numb. ?Becomes swollen. ?Turns pale or blue. ?

## 2022-02-05 NOTE — ED Notes (Signed)
Pt verbalizes understanding of discharge instructions. Opportunity for questions and answers were provided. Pt discharged from the ED.   ?

## 2022-02-05 NOTE — ED Provider Notes (Signed)
?[facility] ?Provider Note ? ? ?CSN: [id number] ?Arrival date & time: 02/05/22  0865 ? ?  ? ?History ? ?Chief Complaint  ?Patient presents with  ? Leg Pain  ? ? ?Yolanda White is a 51 y.o. female with a past medical history of asthma breast cancer status post mastectomy and radiation (2016), anxiety, depression.  Presents emergency department with a chief complaint of right leg and left ankle pain. ? ?Patient states that her right leg has been hurting in the anterior thigh for the last month.  Patient states that pain is even getting progressively worse over this time.  Patient states that last week the pain became so intense that she passed out.  She states that yesterday due to the pain she fell landing on her left side.  Patient denies any her head or any loss of consciousness.  Patient states that after her fall she was having pain and swelling to her left ankle. ? ?Patient did not have any preceding chest pain, shortness of breath, palpitations, or sudden onset of headache prior to her syncopal episode 2 weeks prior.   ? ?Patient states that she went to the emergency department in [place] 3 weeks ago but does not remember the exact course of treatment there. ? ?Patient denies any numbness, weakness, saddle anesthesia, bowel/bladder dysfunction, headache, neck pain, visual disturbance, color change, pallor, wound, rash, dysuria, hematuria, urinary urgency. ? ? ?Leg Pain ?Associated symptoms: back pain   ?Associated symptoms: no fever and no neck pain   ? ?  ? ?Home Medications ?Prior to Admission medications   ?Medication Sig Start Date End Date Taking? Authorizing Provider  ?albuterol (VENTOLIN HFA) 108 (90 Base) MCG/ACT inhaler Inhale 2 puffs into the lungs every 6 (six) hours as needed for wheezing or shortness of breath. Reported on 11/16/2015    [provider]  ?amoxicillin (AMOXIL) 875 MG tablet Take 875 mg by mouth 2 (two) times daily.    [provider]  ?azelastine (ASTELIN) 0.1 % nasal spray Place into both nostrils 2 (two) times daily. Use in each nostril as directed    [provider]  ?cetirizine (ZYRTEC) 10 MG tablet Take by mouth.    [provider]  ?cyclobenzaprine (FLEXERIL) 10 MG tablet Take 10 mg by mouth 2 (two) times daily. 10/16/20   [provider]  ?diazepam (VALIUM) 5 MG tablet Take 5 mg by mouth every 6 (six) hours as needed for anxiety.    [provider]  ?fluticasone (FLONASE) 50 MCG/ACT nasal spray Place 1 spray into both nostrils daily. 11/22/19   [provider]  ?INCRUSE ELLIPTA 62.5 MCG/INH AEPB Inhale 1 puff into the lungs daily.  11/22/19   [provider]  ?methocarbamol (ROBAXIN) 500 MG tablet Take 1 tablet (500 mg total) by mouth 2 (two) times daily. ?Patient not taking: Reported on 11/10/2020 11/24/19   Mesner, Corene Cornea, MD  ?metroNIDAZOLE (FLAGYL) 500 MG tablet Take 500 mg by mouth 3 (three) times daily.    [provider]  ?nabumetone (RELAFEN) 750 MG tablet Take 750 mg by mouth 2 (two) times daily. 10/30/17   [provider]  ?ondansetron (ZOFRAN-ODT) 4 MG disintegrating tablet Take 4 mg by mouth every 8 (eight) hours as needed. 07/26/20   [provider]  ?predniSONE (DELTASONE) 20 MG tablet Take 20 mg by mouth daily with breakfast.    [provider]  ?gabapentin (NEURONTIN) 100 MG capsule Take 1 capsule (100 mg total)  by mouth at bedtime. ?Patient not taking: Reported on 11/18/2017 10/31/16 11/24/19  Truitt Merle, MD  ?omeprazole (PRILOSEC) 20 MG capsule Take 1 capsule (20 mg total) by mouth daily. 06/04/17 11/24/19  Maczis, Barth Kirks, PA-C  ?   ? ?Allergies    ?Patient has no known allergies.   ? ?Review of Systems   ?Review of Systems  ?Constitutional:  Negative for chills and fever.  ?HENT:  Negative for facial swelling.   ?Eyes:  Negative for visual disturbance.  ?Respiratory:  Negative for shortness of breath.   ?Cardiovascular:  Negative  for chest pain.  ?Gastrointestinal:  Positive for diarrhea. Negative for abdominal pain, nausea and vomiting.  ?Genitourinary:  Negative for difficulty urinating, dysuria, hematuria and urgency.  ?Musculoskeletal:  Positive for arthralgias, back pain and joint swelling. Negative for neck pain.  ?Skin:  Negative for color change, pallor, rash and wound.  ?Neurological:  Negative for dizziness, syncope, weakness, light-headedness, numbness and headaches.  ?Psychiatric/Behavioral:  Negative for confusion.   ? ?Physical Exam ?Updated Vital Signs ?BP 120/72   Pulse 80   Temp 98.5 ?F (36.9 ?C)   Resp 16   SpO2 100%  ?Physical Exam ?Vitals and nursing note reviewed.  ?Constitutional:   ?   General: She is not in acute distress. ?   Appearance: She is not ill-appearing, toxic-appearing or diaphoretic.  ?HENT:  ?   Head: Normocephalic and atraumatic. No raccoon eyes, Battle's sign, abrasion, contusion, masses, right periorbital erythema, left periorbital erythema or laceration.  ?Eyes:  ?   General: No scleral icterus.    ?   Right eye: No discharge.     ?   Left eye: No discharge.  ?Cardiovascular:  ?   Rate and Rhythm: Normal rate.  ?   Pulses:     ?     Dorsalis pedis pulses are 2+ on the right side and 2+ on the left side.  ?Pulmonary:  ?   Effort: Pulmonary effort is normal.  ?Musculoskeletal:  ?   Cervical back: No swelling, edema, deformity, erythema, signs of trauma, lacerations, rigidity, spasms, torticollis, tenderness, bony tenderness or crepitus. No pain with movement. Normal range of motion.  ?   Thoracic back: No swelling, edema, deformity, signs of trauma, lacerations, spasms, tenderness or bony tenderness.  ?   Lumbar back: Tenderness present. No swelling, edema, deformity, signs of trauma, lacerations, spasms or bony tenderness.  ?     Back: ? ?   Right hip: No deformity, lacerations, tenderness, bony tenderness or crepitus.  ?   Left hip: No deformity, lacerations, tenderness, bony tenderness or  crepitus.  ?   Right upper leg: Tenderness and bony tenderness present. No swelling, edema, deformity or lacerations.  ?   Left upper leg: Normal.  ?   Right knee: No swelling, deformity, effusion, erythema, ecchymosis, lacerations, bony tenderness or crepitus. Normal range of motion. No tenderness. Normal alignment.  ?   Left knee: No swelling, deformity, effusion, erythema, ecchymosis, lacerations, bony tenderness or crepitus. Normal range of motion. No tenderness. Normal alignment.  ?   Right lower leg: Normal.  ?   Left lower leg: Normal.  ?   Right ankle: No swelling, deformity, ecchymosis or lacerations. No tenderness. Normal range of motion.  ?   Left ankle: Swelling present. No deformity, ecchymosis or lacerations. Tenderness present over the lateral malleolus. Normal range of motion.  ?   Right foot: Normal range of motion and normal capillary refill. No swelling, deformity, laceration,  tenderness, bony tenderness or crepitus. Normal pulse.  ?   Left foot: Normal range of motion and normal capillary refill. No swelling, deformity, laceration, tenderness, bony tenderness or crepitus. Normal pulse.  ?   Comments: Pelvis stable, no leg length discrepancy or rotation to bilateral lower extremities.  ?Feet:  ?   Right foot:  ?   Skin integrity: No ulcer, blister, skin breakdown, erythema, warmth, callus, dry skin or fissure.  ?   Left foot:  ?   Skin integrity: No ulcer, blister, skin breakdown, erythema, warmth, callus, dry skin or fissure.  ?Skin: ?   General: Skin is warm and dry.  ?Neurological:  ?   General: No focal deficit present.  ?   Mental Status: She is alert.  ?   Sensory: Sensation is intact.  ?   Comments: Patient able to lift and hold bilateral lower extremities against gravity without difficulty.  +5 strength to bilateral dorsiflexion and plantarflexion.  Grip strength equal.  Patient moves all limbs equally without difficulty.  Sensation to light touch grossly intact to bilateral upper and  lower extremities.  ?Psychiatric:     ?   Behavior: Behavior is cooperative.  ? ? ?ED Results / Procedures / Treatments   ?Labs ?(all labs ordered are listed, but only abnormal results are displayed) ?Labs Reviewed - No

## 2022-02-05 NOTE — ED Triage Notes (Signed)
Patient here with complaint of going, worsening right leg pain that started approximately one month ago.Patient states she recent lost consciousness while standing and, after that fall, now complains of left leg pain. Patient is alert, oriented, and in no apparent distress at this time. ?

## 2022-02-07 ENCOUNTER — Other Ambulatory Visit: Payer: Self-pay | Admitting: Internal Medicine

## 2022-02-07 DIAGNOSIS — Z1231 Encounter for screening mammogram for malignant neoplasm of breast: Secondary | ICD-10-CM

## 2022-02-14 ENCOUNTER — Ambulatory Visit
Admission: RE | Admit: 2022-02-14 | Discharge: 2022-02-14 | Disposition: A | Discharge status: Home / Self Care | Payer: Medicare Other | Source: Ambulatory Visit | Attending: Internal Medicine | Admitting: Internal Medicine

## 2022-02-14 DIAGNOSIS — Z1231 Encounter for screening mammogram for malignant neoplasm of breast: Secondary | ICD-10-CM

## 2023-01-15 ENCOUNTER — Encounter: Payer: Self-pay | Admitting: Internal Medicine

## 2023-01-15 DIAGNOSIS — Z1231 Encounter for screening mammogram for malignant neoplasm of breast: Secondary | ICD-10-CM

## 2023-02-01 ENCOUNTER — Other Ambulatory Visit: Payer: Self-pay

## 2023-02-01 ENCOUNTER — Emergency Department (HOSPITAL_COMMUNITY): Payer: Medicare Other

## 2023-02-01 ENCOUNTER — Encounter (HOSPITAL_COMMUNITY): Payer: Self-pay

## 2023-02-01 ENCOUNTER — Inpatient Hospital Stay (HOSPITAL_COMMUNITY)
Admission: EM | Admit: 2023-02-01 | Discharge: 2023-02-06 | DRG: 202 | Disposition: A | Discharge status: Home Health | Payer: Medicare Other | Attending: Family Medicine | Admitting: Family Medicine

## 2023-02-01 DIAGNOSIS — I509 Heart failure, unspecified: Secondary | ICD-10-CM | POA: Diagnosis not present

## 2023-02-01 DIAGNOSIS — Z803 Family history of malignant neoplasm of breast: Secondary | ICD-10-CM

## 2023-02-01 DIAGNOSIS — F419 Anxiety disorder, unspecified: Secondary | ICD-10-CM | POA: Diagnosis present

## 2023-02-01 DIAGNOSIS — R739 Hyperglycemia, unspecified: Secondary | ICD-10-CM | POA: Diagnosis present

## 2023-02-01 DIAGNOSIS — Z90710 Acquired absence of both cervix and uterus: Secondary | ICD-10-CM | POA: Diagnosis not present

## 2023-02-01 DIAGNOSIS — J208 Acute bronchitis due to other specified organisms: Secondary | ICD-10-CM | POA: Diagnosis present

## 2023-02-01 DIAGNOSIS — Z825 Family history of asthma and other chronic lower respiratory diseases: Secondary | ICD-10-CM | POA: Diagnosis not present

## 2023-02-01 DIAGNOSIS — B9689 Other specified bacterial agents as the cause of diseases classified elsewhere: Secondary | ICD-10-CM | POA: Diagnosis present

## 2023-02-01 DIAGNOSIS — Z79899 Other long term (current) drug therapy: Secondary | ICD-10-CM

## 2023-02-01 DIAGNOSIS — Z801 Family history of malignant neoplasm of trachea, bronchus and lung: Secondary | ICD-10-CM | POA: Diagnosis not present

## 2023-02-01 DIAGNOSIS — Z923 Personal history of irradiation: Secondary | ICD-10-CM | POA: Diagnosis not present

## 2023-02-01 DIAGNOSIS — J9601 Acute respiratory failure with hypoxia: Secondary | ICD-10-CM | POA: Diagnosis present

## 2023-02-01 DIAGNOSIS — R0789 Other chest pain: Secondary | ICD-10-CM | POA: Diagnosis present

## 2023-02-01 DIAGNOSIS — F32A Depression, unspecified: Secondary | ICD-10-CM | POA: Diagnosis not present

## 2023-02-01 DIAGNOSIS — J45901 Unspecified asthma with (acute) exacerbation: Secondary | ICD-10-CM | POA: Diagnosis present

## 2023-02-01 DIAGNOSIS — D72829 Elevated white blood cell count, unspecified: Secondary | ICD-10-CM | POA: Diagnosis present

## 2023-02-01 DIAGNOSIS — R0602 Shortness of breath: Secondary | ICD-10-CM | POA: Diagnosis present

## 2023-02-01 DIAGNOSIS — T380X5A Adverse effect of glucocorticoids and synthetic analogues, initial encounter: Secondary | ICD-10-CM | POA: Diagnosis not present

## 2023-02-01 DIAGNOSIS — R7303 Prediabetes: Secondary | ICD-10-CM | POA: Diagnosis not present

## 2023-02-01 DIAGNOSIS — K5900 Constipation, unspecified: Secondary | ICD-10-CM | POA: Diagnosis not present

## 2023-02-01 DIAGNOSIS — Z6841 Body Mass Index (BMI) 40.0 and over, adult: Secondary | ICD-10-CM | POA: Diagnosis not present

## 2023-02-01 DIAGNOSIS — J4551 Severe persistent asthma with (acute) exacerbation: Secondary | ICD-10-CM | POA: Diagnosis not present

## 2023-02-01 DIAGNOSIS — Z853 Personal history of malignant neoplasm of breast: Secondary | ICD-10-CM | POA: Diagnosis not present

## 2023-02-01 DIAGNOSIS — E876 Hypokalemia: Secondary | ICD-10-CM | POA: Diagnosis not present

## 2023-02-01 DIAGNOSIS — F17210 Nicotine dependence, cigarettes, uncomplicated: Secondary | ICD-10-CM | POA: Diagnosis present

## 2023-02-01 DIAGNOSIS — Z8249 Family history of ischemic heart disease and other diseases of the circulatory system: Secondary | ICD-10-CM

## 2023-02-01 DIAGNOSIS — Z1152 Encounter for screening for COVID-19: Secondary | ICD-10-CM

## 2023-02-01 DIAGNOSIS — R7989 Other specified abnormal findings of blood chemistry: Secondary | ICD-10-CM | POA: Diagnosis not present

## 2023-02-01 DIAGNOSIS — J4541 Moderate persistent asthma with (acute) exacerbation: Principal | ICD-10-CM

## 2023-02-01 DIAGNOSIS — C50212 Malignant neoplasm of upper-inner quadrant of left female breast: Secondary | ICD-10-CM | POA: Diagnosis present

## 2023-02-01 DIAGNOSIS — Z833 Family history of diabetes mellitus: Secondary | ICD-10-CM

## 2023-02-01 DIAGNOSIS — Z7952 Long term (current) use of systemic steroids: Secondary | ICD-10-CM

## 2023-02-01 LAB — CBC WITH DIFFERENTIAL/PLATELET
Abs Immature Granulocytes: 0.05 10*3/uL (ref 0.00–0.07)
Basophils Absolute: 0.1 10*3/uL (ref 0.0–0.1)
Basophils Relative: 1 %
Eosinophils Absolute: 1.8 10*3/uL — ABNORMAL HIGH (ref 0.0–0.5)
Eosinophils Relative: 12 %
HCT: 44.1 % (ref 36.0–46.0)
Hemoglobin: 14.6 g/dL (ref 12.0–15.0)
Immature Granulocytes: 0 %
Lymphocytes Relative: 24 %
Lymphs Abs: 3.7 10*3/uL (ref 0.7–4.0)
MCH: 28 pg (ref 26.0–34.0)
MCHC: 33.1 g/dL (ref 30.0–36.0)
MCV: 84.5 fL (ref 80.0–100.0)
Monocytes Absolute: 0.9 10*3/uL (ref 0.1–1.0)
Monocytes Relative: 6 %
Neutro Abs: 8.6 10*3/uL — ABNORMAL HIGH (ref 1.7–7.7)
Neutrophils Relative %: 57 %
Platelets: 383 10*3/uL (ref 150–400)
RBC: 5.22 MIL/uL — ABNORMAL HIGH (ref 3.87–5.11)
RDW: 12.3 % (ref 11.5–15.5)
WBC: 15.1 10*3/uL — ABNORMAL HIGH (ref 4.0–10.5)
nRBC: 0 % (ref 0.0–0.2)

## 2023-02-01 LAB — TROPONIN I (HIGH SENSITIVITY)
Troponin I (High Sensitivity): 4 ng/L (ref <18)
Troponin I (High Sensitivity): 3 ng/L (ref <18)

## 2023-02-01 LAB — CBC
HCT: 42.9 % (ref 36.0–46.0)
Hemoglobin: 14.6 g/dL (ref 12.0–15.0)
MCH: 28.3 pg (ref 26.0–34.0)
MCHC: 34 g/dL (ref 30.0–36.0)
MCV: 83.3 fL (ref 80.0–100.0)
Platelets: 332 10*3/uL (ref 150–400)
RBC: 5.15 MIL/uL — ABNORMAL HIGH (ref 3.87–5.11)
RDW: 12.3 % (ref 11.5–15.5)
WBC: 13.2 10*3/uL — ABNORMAL HIGH (ref 4.0–10.5)
nRBC: 0 % (ref 0.0–0.2)

## 2023-02-01 LAB — BASIC METABOLIC PANEL
Anion gap: 13 (ref 5–15)
BUN: 12 mg/dL (ref 6–20)
CO2: 20 mmol/L — ABNORMAL LOW (ref 22–32)
Calcium: 9.2 mg/dL (ref 8.9–10.3)
Chloride: 103 mmol/L (ref 98–111)
Creatinine, Ser: 1.22 mg/dL — ABNORMAL HIGH (ref 0.44–1.00)
GFR, Estimated: 54 mL/min — ABNORMAL LOW (ref >60)
Glucose, Bld: 125 mg/dL — ABNORMAL HIGH (ref 70–99)
Potassium: 3.5 mmol/L (ref 3.5–5.1)
Sodium: 136 mmol/L (ref 135–145)

## 2023-02-01 LAB — CREATININE, SERUM
Creatinine, Ser: 1.28 mg/dL — ABNORMAL HIGH (ref 0.44–1.00)
GFR, Estimated: 51 mL/min — ABNORMAL LOW (ref >60)

## 2023-02-01 LAB — SARS CORONAVIRUS 2 BY RT PCR: SARS Coronavirus 2 by RT PCR: NEGATIVE

## 2023-02-01 MED ORDER — IPRATROPIUM-ALBUTEROL 0.5-2.5 (3) MG/3ML IN SOLN
3.0000 mL | Freq: Once | RESPIRATORY_TRACT | Status: AC
  Administered 2023-02-01: 3 mL via RESPIRATORY_TRACT
  Filled 2023-02-01: qty 3

## 2023-02-01 MED ORDER — METHYLPREDNISOLONE SODIUM SUCC 125 MG IJ SOLR
125.0000 mg | Freq: Once | INTRAMUSCULAR | Status: AC
  Administered 2023-02-01: 125 mg via INTRAVENOUS
  Filled 2023-02-01: qty 2

## 2023-02-01 MED ORDER — MAGNESIUM SULFATE 2 GM/50ML IV SOLN
2.0000 g | Freq: Once | INTRAVENOUS | Status: AC
  Administered 2023-02-01: 2 g via INTRAVENOUS
  Filled 2023-02-01: qty 50

## 2023-02-01 MED ORDER — ALBUTEROL SULFATE (2.5 MG/3ML) 0.083% IN NEBU: 10.0000 mg/h | INHALATION_SOLUTION | RESPIRATORY_TRACT | Status: DC

## 2023-02-01 MED ORDER — SODIUM CHLORIDE 0.45 % IV SOLN: INTRAVENOUS | Status: DC

## 2023-02-01 MED ORDER — IPRATROPIUM-ALBUTEROL 0.5-2.5 (3) MG/3ML IN SOLN
3.0000 mL | Freq: Four times a day (QID) | RESPIRATORY_TRACT | Status: DC
  Administered 2023-02-02 – 2023-02-04 (×12): 3 mL via RESPIRATORY_TRACT
  Filled 2023-02-01 (×12): qty 3

## 2023-02-01 MED ORDER — ALBUTEROL SULFATE (2.5 MG/3ML) 0.083% IN NEBU
2.5000 mg | INHALATION_SOLUTION | RESPIRATORY_TRACT | Status: DC | PRN
  Administered 2023-02-05: 2.5 mg via RESPIRATORY_TRACT
  Filled 2023-02-01: qty 3

## 2023-02-01 MED ORDER — ENOXAPARIN SODIUM 40 MG/0.4ML IJ SOSY
40.0000 mg | PREFILLED_SYRINGE | INTRAMUSCULAR | Status: DC
  Administered 2023-02-01 – 2023-02-05 (×5): 40 mg via SUBCUTANEOUS
  Filled 2023-02-01 (×5): qty 0.4

## 2023-02-01 MED ORDER — ALBUTEROL SULFATE (2.5 MG/3ML) 0.083% IN NEBU
10.0000 mg/h | INHALATION_SOLUTION | Freq: Once | RESPIRATORY_TRACT | Status: AC
  Administered 2023-02-01: 10 mg/h via RESPIRATORY_TRACT
  Filled 2023-02-01: qty 12

## 2023-02-01 MED ORDER — NICOTINE 21 MG/24HR TD PT24
21.0000 mg | MEDICATED_PATCH | Freq: Every day | TRANSDERMAL | Status: DC
  Administered 2023-02-02: 21 mg via TRANSDERMAL
  Filled 2023-02-01: qty 1

## 2023-02-01 MED ORDER — METHYLPREDNISOLONE SODIUM SUCC 125 MG IJ SOLR
125.0000 mg | Freq: Two times a day (BID) | INTRAMUSCULAR | Status: AC
  Administered 2023-02-02 (×2): 125 mg via INTRAVENOUS
  Filled 2023-02-01 (×2): qty 2

## 2023-02-01 MED ORDER — PREDNISONE 20 MG PO TABS
40.0000 mg | ORAL_TABLET | Freq: Every day | ORAL | Status: AC
  Administered 2023-02-03 – 2023-02-06 (×4): 40 mg via ORAL
  Filled 2023-02-01 (×4): qty 2

## 2023-02-01 NOTE — ED Triage Notes (Addendum)
Pt dx with pneumonia in December, pt coming in today for increased coughing, SOB, and rib pain. Pt visibly SOB and has been seen for this in the past. RA saturation 94%, pt placed on 2L [place] for relief.

## 2023-02-01 NOTE — ED Provider Notes (Signed)
[place] EMERGENCY DEPARTMENT AT [facility] Provider Note   CSN: [id number] Arrival date & time: 02/01/23  1842     History  Chief Complaint  Patient presents with   Shortness of Breath   Cough    Yolanda White is a 52 y.o. female.   Shortness of Breath Associated symptoms: cough   Cough Associated symptoms: shortness of breath      Patient with medical history of previous breast cancer, asthma/COPD presents to the emergency department due to shortness of breath and cough.  Symptoms been going on for the last 4 to 5 months.  She is been seen in the ED twice, been on 2 different rounds of antibiotics including a Z-Pak and Augmentin twice.  States that chronically she has been coughing but starting today she has been increasingly short of breath, unable to speak in complete sentences.  She did try her albuterol inhaler but there is no improvement.  She did have any fevers or chills, she does have pain in her chest when she coughs below her ribs bilaterally.  No lower extremity swelling, no history of PE.  Home Medications Prior to Admission medications   Medication Sig Start Date End Date Taking? Authorizing Provider  albuterol (VENTOLIN HFA) 108 (90 Base) MCG/ACT inhaler Inhale 2 puffs into the lungs every 6 (six) hours as needed for wheezing or shortness of breath. Reported on 11/16/2015    [provider]  amoxicillin (AMOXIL) 875 MG tablet Take 875 mg by mouth 2 (two) times daily.    [provider]  azelastine (ASTELIN) 0.1 % nasal spray Place into both nostrils 2 (two) times daily. Use in each nostril as directed    [provider]  cetirizine (ZYRTEC) 10 MG tablet Take by mouth.    [provider]  cyclobenzaprine (FLEXERIL) 10 MG tablet Take 10 mg by mouth 2 (two) times daily. 10/16/20   [provider]  diazepam (VALIUM) 5 MG tablet Take 5 mg by mouth every 6 (six) hours as needed for anxiety.    [provider]  fluticasone (FLONASE) 50 MCG/ACT nasal spray Place 1 spray into both nostrils daily. 11/22/19   [provider]  INCRUSE ELLIPTA 62.5 MCG/INH AEPB Inhale 1 puff into the lungs daily.  11/22/19   [provider]  methocarbamol (ROBAXIN) 500 MG tablet Take 1 tablet (500 mg total) by mouth 2 (two) times daily. Patient not taking: Reported on 11/10/2020 11/24/19   Mesner, Barbara Cower, MD  metroNIDAZOLE (FLAGYL) 500 MG tablet Take 500 mg by mouth 3 (three) times daily.    [provider]  nabumetone (RELAFEN) 750 MG tablet Take 750 mg by mouth 2 (two) times daily. 10/30/17   [provider]  ondansetron (ZOFRAN-ODT) 4 MG disintegrating tablet Take 4 mg by mouth every 8 (eight) hours as needed. 07/26/20   [provider]  predniSONE (DELTASONE) 20 MG tablet Take 20 mg by mouth daily with breakfast.    [provider]  gabapentin (NEURONTIN) 100 MG capsule Take 1 capsule (100 mg total) by mouth at bedtime. Patient not taking: Reported on 11/18/2017 10/31/16 11/24/19  Malachy Mood, MD  omeprazole (PRILOSEC) 20 MG capsule Take 1 capsule (20 mg total) by mouth daily. 06/04/17 11/24/19  Maczis, Elmer Sow, PA-C      Allergies    Patient has no known allergies.    Review of Systems   Review of Systems  Respiratory:  Positive for cough and shortness of breath.  Physical Exam Updated Vital Signs BP 116/85   Pulse (!) 102   Temp 98 F (36.7 C) (Oral)   Resp (!) 24   Ht 5\' 2"  (1.575 m)   Wt 99.8 kg   SpO2 97%   BMI 40.24 kg/m  Physical Exam Vitals and nursing note reviewed. Exam conducted with a chaperone present.  Constitutional:      Appearance: Normal appearance.  HENT:     Head: Normocephalic and atraumatic.  Eyes:     General: No scleral icterus.       Right eye: No discharge.        Left eye: No discharge.     Extraocular Movements: Extraocular movements intact.     Pupils: Pupils are equal, round, and reactive to light.   Cardiovascular:     Rate and Rhythm: Normal rate and regular rhythm.     Pulses: Normal pulses.     Heart sounds: Normal heart sounds. No murmur heard.    No friction rub. No gallop.  Pulmonary:     Effort: Pulmonary effort is normal. Tachypnea present. No respiratory distress.     Breath sounds: Wheezing present.     Comments: Unable to speak in complete sentences, tachypneic, diffuse wheezing and coarse lung sounds Abdominal:     General: Abdomen is flat. Bowel sounds are normal. There is no distension.     Palpations: Abdomen is soft.     Tenderness: There is no abdominal tenderness.  Musculoskeletal:     Right lower leg: No edema.     Left lower leg: No edema.  Skin:    General: Skin is warm and dry.     Coloration: Skin is not jaundiced.  Neurological:     Mental Status: She is alert. Mental status is at baseline.     Coordination: Coordination normal.     ED Results / Procedures / Treatments   Labs (all labs ordered are listed, but only abnormal results are displayed) Labs Reviewed  BASIC METABOLIC PANEL - Abnormal; Notable for the following components:      Result Value   CO2 20 (*)    Glucose, Bld 125 (*)    Creatinine, Ser 1.22 (*)    GFR, Estimated 54 (*)    All other components within normal limits  CBC WITH DIFFERENTIAL/PLATELET - Abnormal; Notable for the following components:   WBC 15.1 (*)    RBC 5.22 (*)    Neutro Abs 8.6 (*)    Eosinophils Absolute 1.8 (*)    All other components within normal limits  SARS CORONAVIRUS 2 BY RT PCR  TROPONIN I (HIGH SENSITIVITY)  TROPONIN I (HIGH SENSITIVITY)    EKG None  Radiology DG Chest Portable 1 View  Result Date: 02/01/2023 CLINICAL DATA:  Shortness of breath EXAM: PORTABLE CHEST 1 VIEW COMPARISON:  Chest x-ray 11/18/2017 FINDINGS: The heart size and mediastinal contours are within normal limits. Both lungs are clear. The visualized skeletal structures are unremarkable. Surgical clips overlie the left lower  chest. IMPRESSION: No active disease. Electronically Signed   By: Darliss CheneyAmy  Guttmann M.D.   On: 02/01/2023 19:36    Procedures Procedures    Medications Ordered in ED Medications  albuterol (PROVENTIL) (2.5 MG/3ML) 0.083% nebulizer solution (has no administration in time range)  ipratropium-albuterol (DUONEB) 0.5-2.5 (3) MG/3ML nebulizer solution 3 mL (3 mLs Nebulization Given 02/01/23 1916)  methylPREDNISolone sodium succinate (SOLU-MEDROL) 125 mg/2 mL injection 125 mg (125 mg Intravenous Given 02/01/23 1911)  ipratropium-albuterol (DUONEB) 0.5-2.5 (3)  MG/3ML nebulizer solution 3 mL (3 mLs Nebulization Given 02/01/23 1940)  magnesium sulfate IVPB 2 g 50 mL (2 g Intravenous New Bag/Given 02/01/23 1943)    ED Course/ Medical Decision Making/ A&P                             Medical Decision Making Amount and/or Complexity of Data Reviewed Labs: ordered. Radiology: ordered.  Risk Prescription drug management. Decision regarding hospitalization.   Patient has to the emergency room due to shortness of breath.  Differential includes ACS, PE, pneumonia, arrhythmia, asthma exacerbation, pneumothorax.  On exam patient is tachypneic, diffuse wheezing.  She is afebrile but she is mildly tachycardic and tachypneic, no hypoxia.  Will start with labs, x-ray and DuoNeb with steroids.  I ordered 2 DuoNeb's and 1 continuous neb, patient did DuoNeb right before arrival.  125 Solu-Medrol, magnesium.    On reevaluation patient is still tachypneic and wheezing although she has no able to speak complete sentences.    Given persistent tachypnea and wheezing I do think she needs admission for asthma exacerbation.  There is no ischemic change on the EKG, she does not have a leukocytosis and there is no evidence of pneumonia on the chest x-ray.  I considered PE but given the wheezing on exam and history of asthma I think it is more likely an asthma exacerbation and I think PE is sounds unlikely.    I consulted with  the hospitalist service who agrees with admission.        Final Clinical Impression(s) / ED Diagnoses Final diagnoses:  Moderate persistent asthma with exacerbation    Rx / DC Orders ED Discharge Orders     None         Theron Arista, Cordelia Poche 02/01/23 2043    Rondel Baton, MD 02/03/23 [phone]

## 2023-02-01 NOTE — H&P (Signed)
History and Physical    Patient: Yolanda White JQG:[id number] DOB: Jun 30, 1971 DOA: 02/01/2023 DOS: the patient was seen and examined on 02/01/2023 PCP: Fleet Contras, MD  Patient coming from: Home  Chief Complaint:  Chief Complaint  Patient presents with   Shortness of Breath   Cough   HPI: Yolanda White is a 52 y.o. female with medical history significant of asthma, former tobacco abuse, history of left breast cancer, anxiety disorder, history of previous radiation therapy and seasonal allergies who presents today with shortness of breath cough and wheezing.  Symptoms have been going on for 2 days now.  Patient has taken her home regimen but no relief.  Denied any fever or chills denied any nausea or vomiting.  She has seasonal allergies with the pollens patient believes she is having a reaction to the pollens.  This has triggered her attacks.  In the ER she received IV Solu-Medrol, DuoNeb and not much relief.  She is requiring oxygen at the moment.  Patient is being admitted with acute exacerbation of asthma for inpatient treatment.  Review of Systems: As mentioned in the history of present illness. All other systems reviewed and are negative. 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   Depression    Myalgia    Personal history of radiation therapy    Seasonal allergies    Past Surgical History:  Procedure Laterality Date   ABDOMINAL HYSTERECTOMY     BREAST LUMPECTOMY Left 09/2015   RADIOACTIVE SEED GUIDED PARTIAL MASTECTOMY WITH AXILLARY SENTINEL LYMPH NODE BIOPSY Left 10/11/2015   Procedure: RADIOACTIVE SEED LOCALIZATION LEFT BREAST LUMPECTOMY WITH LEFT  AXILLARY SENTINEL LYMPH NODE BIOPSY;  Surgeon: Glenna Fellows, MD;  Location: [facility];  Service: General;  Laterality: Left;    TUBAL LIGATION     Social History:  reports that she has been smoking cigarettes. She has a 17.00 pack-year smoking history. She has never used smokeless tobacco. She reports current drug use. Drug: Marijuana. She reports that she does not drink alcohol.  No Known Allergies  Family History  Problem Relation Age of Onset   Diabetes Mother    Diabetes Father    Lung cancer Father 41       smoker   Cancer Paternal Aunt        cervical   Diabetes Brother    Heart attack Paternal Grandmother        in her 67s   Diabetes Brother        maternal half brother   Asthma Brother    Asthma Maternal Aunt     Prior to Admission medications   Medication Sig Start Date End Date Taking? Authorizing Provider  albuterol (VENTOLIN HFA) 108 (90 Base) MCG/ACT inhaler Inhale 2 puffs into the lungs every 6 (six) hours as needed for wheezing or shortness of breath. Reported on 11/16/2015    [provider]  amoxicillin (AMOXIL) 875 MG tablet Take 875 mg by mouth 2 (two) times daily.    [provider]  azelastine (ASTELIN) 0.1 % nasal spray Place into both nostrils 2 (two) times daily. Use in each nostril as directed    [provider]  cetirizine (ZYRTEC) 10 MG tablet Take by mouth.    [provider]  cyclobenzaprine (FLEXERIL) 10 MG tablet Take 10 mg by mouth 2 (two) times daily. 10/16/20   [provider]  diazepam (VALIUM) 5 MG tablet Take 5 mg by mouth every 6 (six) hours as needed for anxiety.    [provider]  fluticasone (FLONASE) 50 MCG/ACT nasal spray Place 1 spray into both nostrils daily. 11/22/19   [provider]  INCRUSE ELLIPTA 62.5 MCG/INH AEPB Inhale 1 puff into the lungs daily.  11/22/19   [provider]  methocarbamol (ROBAXIN) 500 MG tablet Take 1 tablet (500 mg total) by mouth 2 (two) times daily. Patient not taking: Reported on 11/10/2020 11/24/19   Mesner, Barbara CowerJason, MD  metroNIDAZOLE (FLAGYL) 500 MG tablet Take 500  mg by mouth 3 (three) times daily.    [provider]  nabumetone (RELAFEN) 750 MG tablet Take 750 mg by mouth 2 (two) times daily. 10/30/17   [provider]  ondansetron (ZOFRAN-ODT) 4 MG disintegrating tablet Take 4 mg by mouth every 8 (eight) hours as needed. 07/26/20   [provider]  predniSONE (DELTASONE) 20 MG tablet Take 20 mg by mouth daily with breakfast.    [provider]  gabapentin (NEURONTIN) 100 MG capsule Take 1 capsule (100 mg total) by mouth at bedtime. Patient not taking: Reported on 11/18/2017 10/31/16 11/24/19  Malachy MoodFeng, Yan, MD  omeprazole (PRILOSEC) 20 MG capsule Take 1 capsule (20 mg total) by mouth daily. 06/04/17 11/24/19  Jacinto HalimMaczis, Michael M, PA-C    Physical Exam: Vitals:   02/01/23 2030 02/01/23 2044 02/01/23 2045 02/01/23 2115  BP: (!) 125/90  126/81 (!) 128/92  Pulse: 99  89 79  Resp: (!) 23  20 (!) 25  Temp:      TempSrc:      SpO2: 94% 100% 96% 100%  Weight:      Height:       Constitutional: NAD, calm, comfortable Eyes: PERRL, lids and conjunctivae normal ENMT: Mucous membranes are moist. Posterior pharynx clear of any exudate or lesions.Normal dentition.  Neck: normal, supple, no masses, no thyromegaly Respiratory: Decreased air entry with marked expiratory wheezing, no crackles. Normal respiratory effort. No accessory muscle use.  Cardiovascular: Sinus tachycardia, no murmurs / rubs / gallops. No extremity edema. 2+ pedal pulses. No carotid bruits.  Abdomen: no tenderness, no masses palpated. No hepatosplenomegaly. Bowel sounds positive.  Musculoskeletal: Good range of motion, no joint swelling or tenderness, Skin: no rashes, lesions, ulcers. No induration Neurologic: CN 2-12 grossly intact. Sensation intact, DTR normal. Strength 5/5 in all 4.  Psychiatric: Normal judgment and insight. Alert and oriented x 3.  Anxious mood  Data Reviewed:  Creatinine 1.22, glucose 155 CO2 20.  White count is 15.1 hemoglobin 14.6.  COVID-19  negative.  Chest x-ray showed no active disease.  Assessment and Plan:  #1 acute exacerbation of asthma: Patient is having acute respiratory failure with hypoxemia.  Will admit the patient.  Initiate IV Solu-Medrol, nebulizer treatment, oxygen, other supportive care.  #2 morbid obesity: Dietary counseling,  #3 history of breast cancer: Continue outpatient follow-up with oncology  #4 anxiety disorder: Confirm on resume home regimen.    Advance Care Planning:   Code Status: Full Code   Consults: None  Family Communication: No family at bedside  Severity of Illness: The appropriate patient status for this patient is INPATIENT. Inpatient status is judged to be reasonable and necessary in order to provide the required intensity of service to ensure the patient's safety. The patient's presenting symptoms,  physical exam findings, and initial radiographic and laboratory data in the context of their chronic comorbidities is felt to place them at high risk for further clinical deterioration. Furthermore, it is not anticipated that the patient will be medically stable for discharge from the hospital within 2 midnights of admission.   * I certify that at the point of admission it is my clinical judgment that the patient will require inpatient hospital care spanning beyond 2 midnights from the point of admission due to high intensity of service, high risk for further deterioration and high frequency of surveillance required.*  AuthorLonia Blood, MD 02/01/2023 9:23 PM  For on call review [email or web address].

## 2023-02-02 ENCOUNTER — Inpatient Hospital Stay (HOSPITAL_COMMUNITY): Payer: Medicare Other

## 2023-02-02 DIAGNOSIS — J208 Acute bronchitis due to other specified organisms: Secondary | ICD-10-CM

## 2023-02-02 DIAGNOSIS — B9689 Other specified bacterial agents as the cause of diseases classified elsewhere: Secondary | ICD-10-CM

## 2023-02-02 DIAGNOSIS — R0789 Other chest pain: Secondary | ICD-10-CM | POA: Diagnosis not present

## 2023-02-02 DIAGNOSIS — D72829 Elevated white blood cell count, unspecified: Secondary | ICD-10-CM

## 2023-02-02 DIAGNOSIS — R739 Hyperglycemia, unspecified: Secondary | ICD-10-CM | POA: Diagnosis not present

## 2023-02-02 DIAGNOSIS — J4551 Severe persistent asthma with (acute) exacerbation: Secondary | ICD-10-CM | POA: Diagnosis not present

## 2023-02-02 DIAGNOSIS — F17210 Nicotine dependence, cigarettes, uncomplicated: Secondary | ICD-10-CM

## 2023-02-02 LAB — COMPREHENSIVE METABOLIC PANEL
ALT: 18 U/L (ref 0–44)
AST: 22 U/L (ref 15–41)
Albumin: 3.6 g/dL (ref 3.5–5.0)
Alkaline Phosphatase: 112 U/L (ref 38–126)
Anion gap: 8 (ref 5–15)
BUN: 15 mg/dL (ref 6–20)
CO2: 20 mmol/L — ABNORMAL LOW (ref 22–32)
Calcium: 8.6 mg/dL — ABNORMAL LOW (ref 8.9–10.3)
Chloride: 105 mmol/L (ref 98–111)
Creatinine, Ser: 1.29 mg/dL — ABNORMAL HIGH (ref 0.44–1.00)
GFR, Estimated: 51 mL/min — ABNORMAL LOW (ref >60)
Glucose, Bld: 196 mg/dL — ABNORMAL HIGH (ref 70–99)
Potassium: 3.7 mmol/L (ref 3.5–5.1)
Sodium: 133 mmol/L — ABNORMAL LOW (ref 135–145)
Total Bilirubin: 0.6 mg/dL (ref 0.3–1.2)
Total Protein: 7.4 g/dL (ref 6.5–8.1)

## 2023-02-02 LAB — CBC
HCT: 39.8 % (ref 36.0–46.0)
Hemoglobin: 13.2 g/dL (ref 12.0–15.0)
MCH: 28.3 pg (ref 26.0–34.0)
MCHC: 33.2 g/dL (ref 30.0–36.0)
MCV: 85.2 fL (ref 80.0–100.0)
Platelets: 300 10*3/uL (ref 150–400)
RBC: 4.67 MIL/uL (ref 3.87–5.11)
RDW: 12.3 % (ref 11.5–15.5)
WBC: 8 10*3/uL (ref 4.0–10.5)
nRBC: 0 % (ref 0.0–0.2)

## 2023-02-02 LAB — HIV ANTIBODY (ROUTINE TESTING W REFLEX): HIV Screen 4th Generation wRfx: NONREACTIVE

## 2023-02-02 MED ORDER — FLUTICASONE FUROATE-VILANTEROL 200-25 MCG/ACT IN AEPB
1.0000 | INHALATION_SPRAY | Freq: Every day | RESPIRATORY_TRACT | Status: DC
  Administered 2023-02-03 – 2023-02-06 (×4): 1 via RESPIRATORY_TRACT
  Filled 2023-02-02 (×2): qty 28

## 2023-02-02 MED ORDER — ORAL CARE MOUTH RINSE: 15.0000 mL | OROMUCOSAL | Status: DC | PRN

## 2023-02-02 MED ORDER — GUAIFENESIN-CODEINE 100-10 MG/5ML PO SOLN
10.0000 mL | Freq: Four times a day (QID) | ORAL | Status: DC | PRN
  Administered 2023-02-02 – 2023-02-05 (×9): 10 mL via ORAL
  Filled 2023-02-02 (×9): qty 10

## 2023-02-02 MED ORDER — ONDANSETRON HCL 4 MG/2ML IJ SOLN
4.0000 mg | Freq: Four times a day (QID) | INTRAMUSCULAR | Status: DC | PRN
  Administered 2023-02-02 – 2023-02-05 (×2): 4 mg via INTRAVENOUS
  Filled 2023-02-02 (×2): qty 2

## 2023-02-02 MED ORDER — BENZONATATE 100 MG PO CAPS
100.0000 mg | ORAL_CAPSULE | Freq: Three times a day (TID) | ORAL | Status: DC | PRN
  Administered 2023-02-02: 100 mg via ORAL
  Filled 2023-02-02: qty 1

## 2023-02-02 MED ORDER — MONTELUKAST SODIUM 10 MG PO TABS
10.0000 mg | ORAL_TABLET | Freq: Every day | ORAL | Status: DC
  Administered 2023-02-02 – 2023-02-05 (×4): 10 mg via ORAL
  Filled 2023-02-02 (×4): qty 1

## 2023-02-02 NOTE — Progress Notes (Signed)
Patient reports generalized fatigue and does not wish to ambulate at this time.

## 2023-02-02 NOTE — Hospital Course (Signed)
Mom Yolanda White is a 52 y.o. female with a history of asthma, tobacco use, left breast cancer status post lumpectomy/chemotherapy/radiation, anxiety.  Patient presented secondary to shortness of breath and cough with associated wheezing found to have evidence of an asthma exacerbation with associated bacterial bronchitis.  Patient started on empiric antibiotics in addition to supportive care.

## 2023-02-02 NOTE — Evaluation (Signed)
Occupational Therapy Evaluation Patient Details Name: Yolanda White MRN: [id number] DOB: 10-16-1971 Today's Date: 02/02/2023   History of Present Illness Yolanda White is a 52 y.o. female with medical history significant of asthma, former tobacco abuse, history of left breast cancer, anxiety disorder, history of previous radiation therapy and seasonal allergies who presents today with shortness of breath cough and wheezing.  Symptoms have been going on for 2 days now.  Patient has taken her home regimen but no relief.  Denied any fever or chills denied any nausea or vomiting.  She has seasonal allergies with the pollens patient believes she is having a reaction to the pollens.  This has triggered her attacks.  In the ER she received IV Solu-Medrol, DuoNeb and not much relief.  She is requiring oxygen at the moment.  Patient admitted with acute exacerbation of asthma for inpatient treatment   Clinical Impression   Pt admitted with SOB and cough. Pt currently with functional limitations due to the deficits listed below (see OT Problem List).  Pt will benefit from acute skilled OT to increase their safety and independence with ADL and functional mobility for ADL to facilitate discharge     Recommendations for follow up therapy are one component of a multi-disciplinary discharge planning process, led by the attending physician.  Recommendations may be updated based on patient status, additional functional criteria and insurance authorization.   Assistance Recommended at Discharge Intermittent Supervision/Assistance  Patient can return home with the following Assistance with cooking/housework;A little help with bathing/dressing/bathroom;A little help with walking and/or transfers    Functional Status Assessment  Patient has had a recent decline in their functional status and demonstrates the ability to make significant improvements in function in a reasonable and predictable amount of time.   Equipment Recommendations  BSC/3in1       Precautions / Restrictions Precautions Precautions: Fall Precaution Comments: shortness of breath      Mobility Bed Mobility Overal bed mobility: Needs Assistance Bed Mobility: Supine to Sit     Supine to sit: Min assist     General bed mobility comments: increased time    Transfers Overall transfer level: Needs assistance Equipment used: Rolling walker (2 wheels) Transfers: Sit to/from Stand Sit to Stand: Min assist, Mod assist           General transfer comment: increased time          ADL either performed or assessed with clinical judgement   ADL Overall ADL's : Needs assistance/impaired Eating/Feeding: Set up;Sitting   Grooming: Set up;Sitting   Upper Body Bathing: Minimal assistance;Sitting   Lower Body Bathing: Maximal assistance;Sitting/lateral leans   Upper Body Dressing : Set up;Sitting   Lower Body Dressing: Sitting/lateral leans   Toilet Transfer: Moderate assistance;Rolling walker (2 wheels) Toilet Transfer Details (indicate cue type and reason): sit to stand only Toileting- Architect and Hygiene: Moderate assistance;Sit to/from stand         General ADL Comments: sit to stand only.  declined getting to chair     Vision Patient Visual Report: No change from baseline              Pertinent Vitals/Pain Pain Assessment Pain Assessment: No/denies pain     Hand Dominance     Extremity/Trunk Assessment Upper Extremity Assessment Upper Extremity Assessment: Generalized weakness           Communication Communication Communication: No difficulties   Cognition Arousal/Alertness: Awake/alert Behavior During Therapy: WFL for tasks assessed/performed, Anxious Overall  Cognitive Status: Within Functional Limits for tasks assessed                                                  Home Living Family/patient expects to be discharged to:: Private  residence Living Arrangements: Children Available Help at Discharge: Family Type of Home: House Home Access: Level entry     Home Layout: One level     Bathroom Shower/Tub: Tub/shower unit;Walk-in shower   Bathroom Toilet: Standard     Home Equipment: None          Prior Functioning/Environment Prior Level of Function : Independent/Modified Independent                        OT Problem List: Decreased strength;Decreased activity tolerance;Obesity;Cardiopulmonary status limiting activity      OT Treatment/Interventions: Self-care/ADL training;Therapeutic activities;Patient/family education    OT Goals(Current goals can be found in the care plan section) Acute Rehab OT Goals Patient Stated Goal: get well  and stop coughing OT Goal Formulation: With patient Time For Goal Achievement: 02/16/23 ADL Goals Pt Will Perform Lower Body Bathing: with min assist;sit to/from stand Pt Will Perform Lower Body Dressing: with min assist;sit to/from stand Pt Will Transfer to Toilet: with min assist;bedside commode;regular height toilet Pt Will Perform Toileting - Clothing Manipulation and hygiene: sit to/from stand Additional ADL Goal #1: Pt will verbalize energy conservation techniques with ADL activity at mod I level with handout provided  OT Frequency: Min 2X/week    Co-evaluation              AM-PAC OT "6 Clicks" Daily Activity     Outcome Measure Help from another person eating meals?: None Help from another person taking care of personal grooming?: None Help from another person toileting, which includes using toliet, bedpan, or urinal?: A Lot Help from another person bathing (including washing, rinsing, drying)?: A Little Help from another person to put on and taking off regular upper body clothing?: A Little Help from another person to put on and taking off regular lower body clothing?: A Lot 6 Click Score: 18   End of Session Equipment Utilized During  Treatment: Rolling walker (2 wheels) Nurse Communication: Mobility status  Activity Tolerance: Patient limited by fatigue Patient left: in bed;with call bell/phone within reach;with bed alarm set  OT Visit Diagnosis: Unsteadiness on feet (R26.81);Muscle weakness (generalized) (M62.81)                Time: 3976-7341 OT Time Calculation (min): 20 min Charges:  OT General Charges $OT Visit: 1 Visit OT Evaluation $OT Eval Low Complexity: 1 Low  Lise Auer, OT Acute Rehabilitation Services  [phone], [place] 02/02/2023, 5:02 PM

## 2023-02-02 NOTE — Progress Notes (Incomplete)
PROGRESS NOTE   Yolanda White  LKT:[id number] DOB: [birth year] DOA: 02/01/2023 PCP: Fleet Contras, MD   Date of Service: the patient was seen and examined on 02/02/2023  Brief Narrative:  52 y.o. female with medical history significant of asthma, former tobacco abuse, history of left breast cancer, anxiety disorder, history of previous radiation therapy and seasonal allergies who presented to [name] emergency department with complaints of shortness of breath cough and wheezing.    Upon evaluation in the emergency department patient received intravenous Solu-Medrol and nebulized bronchodilators.  Patient was clinically felt to be suffering from an acute asthma exacerbation and the hospitalist group was then called to assess the patient for admission to the hospital.   Assessment and Plan: No notes have been filed under this hospital service. Service: Hospitalist       Subjective:  ***  Physical Exam:  Vitals:   02/01/23 2136 02/02/23 0200 02/02/23 0239 02/02/23 0612  BP: (!) 143/68 124/76  121/77  Pulse: 92 99  73  Resp: (!) 21 (!) 22  20  Temp: 98.3 F (36.8 C) 98 F (36.7 C)  97.8 F (36.6 C)  TempSrc: Oral Oral  Oral  SpO2: 99%  98% 97%  Weight:      Height:        *** Constitutional: Awake alert and oriented x3, no associated distress.   Skin: no rashes, no lesions, good skin turgor noted. Eyes: Pupils are equally reactive to light.  No evidence of scleral icterus or conjunctival pallor.  ENMT: Moist mucous membranes noted.  Posterior pharynx clear of any exudate or lesions.   Respiratory: clear to auscultation bilaterally, no wheezing, no crackles. Normal respiratory effort. No accessory muscle use.  Cardiovascular: Regular rate and rhythm, no murmurs / rubs / gallops. No extremity edema. 2+ pedal pulses. No carotid bruits.  Abdomen: Abdomen is soft and nontender.  No evidence of intra-abdominal masses.  Positive bowel sounds noted in all  quadrants.   Musculoskeletal: No joint deformity upper and lower extremities. Good ROM, no contractures. Normal muscle tone.    Data Reviewed:  I have personally reviewed and interpreted labs, imaging.  Significant findings are ***  CBC: Recent Labs  Lab 02/01/23 1905 02/01/23 2153 02/02/23 0503  WBC 15.1* 13.2* 8.0  NEUTROABS 8.6*  --   --   HGB 14.6 14.6 13.2  HCT 44.1 42.9 39.8  MCV 84.5 83.3 85.2  PLT 383 332 300   Basic Metabolic Panel: Recent Labs  Lab 02/01/23 1905 02/01/23 2153 02/02/23 0503  NA 136  --  133*  K 3.5  --  3.7  CL 103  --  105  CO2 20*  --  20*  GLUCOSE 125*  --  196*  BUN 12  --  15  CREATININE 1.22* 1.28* 1.29*  CALCIUM 9.2  --  8.6*   GFR: Estimated Creatinine Clearance: 57.7 mL/min (A) (by C-G formula based on SCr of 1.29 mg/dL (H)). Liver Function Tests: Recent Labs  Lab 02/02/23 0503  AST 22  ALT 18  ALKPHOS 112  BILITOT 0.6  PROT 7.4  ALBUMIN 3.6    Coagulation Profile: No results for input(s): "INR", "PROTIME" in the last 168 hours.   EKG/Telemetry: Personally reviewed.  Rhythm is *** with heart rate of ***.  No dynamic ST segment changes appreciated.   Code Status:  {Palliative Code status:23503}.  Code status decision has been confirmed with: *** Family Communication: ***    Severity of Illness:  {  Observation/Inpatient:21159}  Time spent:  *** minutes  Author:  Marinda Elk MD  02/02/2023 9:25 AM

## 2023-02-02 NOTE — Progress Notes (Signed)
PT Cancellation Note  Patient Details Name: Yolanda White MRN: [id number] DOB: [birth year]   Cancelled Treatment:    Reason Eval/Treat Not Completed: Patient at procedure or test/unavailable    Faye Ramsay, PT Acute Rehabilitation  Office: [phone]

## 2023-02-03 DIAGNOSIS — R7303 Prediabetes: Secondary | ICD-10-CM | POA: Diagnosis present

## 2023-02-03 DIAGNOSIS — J208 Acute bronchitis due to other specified organisms: Secondary | ICD-10-CM | POA: Diagnosis not present

## 2023-02-03 DIAGNOSIS — R739 Hyperglycemia, unspecified: Secondary | ICD-10-CM | POA: Diagnosis not present

## 2023-02-03 DIAGNOSIS — B9689 Other specified bacterial agents as the cause of diseases classified elsewhere: Secondary | ICD-10-CM | POA: Diagnosis present

## 2023-02-03 DIAGNOSIS — R0789 Other chest pain: Secondary | ICD-10-CM | POA: Diagnosis not present

## 2023-02-03 DIAGNOSIS — J4551 Severe persistent asthma with (acute) exacerbation: Secondary | ICD-10-CM | POA: Diagnosis not present

## 2023-02-03 DIAGNOSIS — D72829 Elevated white blood cell count, unspecified: Secondary | ICD-10-CM | POA: Diagnosis present

## 2023-02-03 LAB — CBC WITH DIFFERENTIAL/PLATELET
Abs Immature Granulocytes: 0.08 10*3/uL — ABNORMAL HIGH (ref 0.00–0.07)
Basophils Absolute: 0 10*3/uL (ref 0.0–0.1)
Basophils Relative: 0 %
Eosinophils Absolute: 0 10*3/uL (ref 0.0–0.5)
Eosinophils Relative: 0 %
HCT: 42.3 % (ref 36.0–46.0)
Hemoglobin: 13.6 g/dL (ref 12.0–15.0)
Immature Granulocytes: 1 %
Lymphocytes Relative: 9 %
Lymphs Abs: 1.3 10*3/uL (ref 0.7–4.0)
MCH: 28.5 pg (ref 26.0–34.0)
MCHC: 32.2 g/dL (ref 30.0–36.0)
MCV: 88.5 fL (ref 80.0–100.0)
Monocytes Absolute: 0.7 10*3/uL (ref 0.1–1.0)
Monocytes Relative: 4 %
Neutro Abs: 12.8 10*3/uL — ABNORMAL HIGH (ref 1.7–7.7)
Neutrophils Relative %: 86 %
Platelets: 331 10*3/uL (ref 150–400)
RBC: 4.78 MIL/uL (ref 3.87–5.11)
RDW: 12.2 % (ref 11.5–15.5)
WBC: 14.9 10*3/uL — ABNORMAL HIGH (ref 4.0–10.5)
nRBC: 0 % (ref 0.0–0.2)

## 2023-02-03 LAB — HEMOGLOBIN A1C
Hgb A1c MFr Bld: 5.8 % — ABNORMAL HIGH (ref 4.8–5.6)
Mean Plasma Glucose: 119.76 mg/dL

## 2023-02-03 LAB — COMPREHENSIVE METABOLIC PANEL
ALT: 14 U/L (ref 0–44)
AST: 15 U/L (ref 15–41)
Albumin: 4.1 g/dL (ref 3.5–5.0)
Alkaline Phosphatase: 113 U/L (ref 38–126)
Anion gap: 9 (ref 5–15)
BUN: 17 mg/dL (ref 6–20)
CO2: 23 mmol/L (ref 22–32)
Calcium: 9.3 mg/dL (ref 8.9–10.3)
Chloride: 104 mmol/L (ref 98–111)
Creatinine, Ser: 1.05 mg/dL — ABNORMAL HIGH (ref 0.44–1.00)
GFR, Estimated: >60 mL/min (ref >60)
Glucose, Bld: 210 mg/dL — ABNORMAL HIGH (ref 70–99)
Potassium: 4.7 mmol/L (ref 3.5–5.1)
Sodium: 136 mmol/L (ref 135–145)
Total Bilirubin: 0.4 mg/dL (ref 0.3–1.2)
Total Protein: 8.4 g/dL — ABNORMAL HIGH (ref 6.5–8.1)

## 2023-02-03 LAB — D-DIMER, QUANTITATIVE: D-Dimer, Quant: 0.35 ug/mL-FEU (ref 0.00–0.50)

## 2023-02-03 LAB — MAGNESIUM: Magnesium: 2.3 mg/dL (ref 1.7–2.4)

## 2023-02-03 LAB — BRAIN NATRIURETIC PEPTIDE: B Natriuretic Peptide: 1167.4 pg/mL — ABNORMAL HIGH (ref 0.0–100.0)

## 2023-02-03 MED ORDER — MELATONIN 5 MG PO TABS
5.0000 mg | ORAL_TABLET | Freq: Once | ORAL | Status: AC
  Administered 2023-02-03: 5 mg via ORAL
  Filled 2023-02-03: qty 1

## 2023-02-03 MED ORDER — FUROSEMIDE 10 MG/ML IJ SOLN
20.0000 mg | Freq: Two times a day (BID) | INTRAMUSCULAR | Status: DC
  Administered 2023-02-03 – 2023-02-06 (×6): 20 mg via INTRAVENOUS
  Filled 2023-02-03 (×6): qty 2

## 2023-02-03 MED ORDER — ACETAMINOPHEN 325 MG PO TABS
650.0000 mg | ORAL_TABLET | ORAL | Status: DC | PRN
  Administered 2023-02-03 – 2023-02-06 (×6): 650 mg via ORAL
  Filled 2023-02-03 (×6): qty 2

## 2023-02-03 MED ORDER — ADULT MULTIVITAMIN W/MINERALS CH
1.0000 | ORAL_TABLET | Freq: Every day | ORAL | Status: DC
  Administered 2023-02-03 – 2023-02-06 (×4): 1 via ORAL
  Filled 2023-02-03 (×4): qty 1

## 2023-02-03 MED ORDER — BOOST / RESOURCE BREEZE PO LIQD CUSTOM
1.0000 | Freq: Three times a day (TID) | ORAL | Status: DC
  Administered 2023-02-03 – 2023-02-05 (×6): 1 via ORAL

## 2023-02-03 MED ORDER — POLYETHYLENE GLYCOL 3350 17 G PO PACK
17.0000 g | PACK | Freq: Every day | ORAL | Status: DC | PRN
  Administered 2023-02-03 – 2023-02-04 (×2): 17 g via ORAL
  Filled 2023-02-03 (×2): qty 1

## 2023-02-03 MED ORDER — DOXYCYCLINE HYCLATE 100 MG PO TABS
100.0000 mg | ORAL_TABLET | Freq: Two times a day (BID) | ORAL | Status: DC
  Administered 2023-02-03 – 2023-02-06 (×7): 100 mg via ORAL
  Filled 2023-02-03 (×7): qty 1

## 2023-02-03 MED ORDER — MELATONIN 5 MG PO TABS
5.0000 mg | ORAL_TABLET | Freq: Every evening | ORAL | Status: AC | PRN
  Administered 2023-02-03 – 2023-02-04 (×2): 5 mg via ORAL
  Filled 2023-02-03 (×2): qty 1

## 2023-02-03 NOTE — Evaluation (Signed)
Physical Therapy Evaluation Patient Details Name: Yolanda White MRN: [id number] DOB: Nov 30, 1970 Today's Date: 02/03/2023  History of Present Illness  Patient is 52 y.o. female [facility] ED with c/o SOB, cough, and wheezing. Upon evaluation in the emergency department patient received intravenous Solu-Medrol and nebulized bronchodilators.  Patient was clinically felt to be suffering from an acute asthma exacerbation. PMH significant of asthma, former tobacco abuse, history of left breast cancer, anxiety disorder, history of previous radiation therapy and seasonal allergies.   Clinical Impression  Yolanda White is 52 y.o. female admitted with above HPI and diagnosis. Patient is currently limited by functional impairments below (see PT problem list). Patient lives with her family and is independent at baseline but has been declining in function since [place]~January. Currently pt require min assist with RW for transfers and gait of ~40 with 3L/min to maintain SpO2 of 92% or better with activity. Patient will benefit from continued skilled PT interventions to address impairments and progress independence with mobility, recommending follow up therapy and rollator. Acute PT will follow and progress as able.        Recommendations for follow up therapy are one component of a multi-disciplinary discharge planning process, led by the attending physician.  Recommendations may be updated based on patient status, additional functional criteria and insurance authorization.  Follow Up Recommendations       Assistance Recommended at Discharge Intermittent Supervision/Assistance  Patient can return home with the following  A little help with walking and/or transfers;A little help with bathing/dressing/bathroom;Assistance with cooking/housework;Assist for transportation;Help with stairs or ramp for entrance    Equipment Recommendations Rollator (4 wheels)  Recommendations for Other Services        Functional Status Assessment Patient has had a recent decline in their functional status and demonstrates the ability to make significant improvements in function in a reasonable and predictable amount of time.     Precautions / Restrictions Precautions Precautions: Fall Precaution Comments: shortness of breath Restrictions Weight Bearing Restrictions: No      Mobility  Bed Mobility Overal bed mobility: Needs Assistance Bed Mobility: Supine to Sit     Supine to sit: Supervision, HOB elevated     General bed mobility comments: pt usign bed rail to privot and raise trunk.    Transfers Overall transfer level: Needs assistance Equipment used: 1 person hand held assist, Rolling walker (2 wheels) Transfers: Sit to/from Stand Sit to Stand: Min assist           General transfer comment: pt using hands for power up fro EOB, toilet, recliner. min assist to steady on rise.    Ambulation/Gait Ambulation/Gait assistance: Min assist Gait Distance (Feet): 40 Feet Assistive device: Rolling walker (2 wheels), 1 person hand held assist Gait Pattern/deviations: Step-through pattern, Decreased step length - right, Decreased step length - left, Decreased stride length, Shuffle, Trunk flexed Gait velocity: decr     General Gait Details: patient ambulated short bout to bathroom and then recliner with HHA to steady. Pt reaching out for support with other UE. RW provided for ambulation in hallway, pt slow and flexed psosture with shuffled steps. On 3L/min throughout, pt desat to 80's (poor read on pleth), recovered quickly to 98%.  Stairs            Wheelchair Mobility    Modified Rankin (Stroke Patients Only)       Balance Overall balance assessment: Needs assistance Sitting-balance support: Feet supported Sitting balance-Leahy Scale: Good  Standing balance support: During functional activity, Bilateral upper extremity supported Standing balance-Leahy Scale:  Poor Standing balance comment: reliant on at external assist wit RW or HHA from therapist.                             Pertinent Vitals/Pain Pain Assessment Pain Assessment: 0-10 Pain Score: 7  Pain Location: chest pain related to coughing Pain Descriptors / Indicators: Aching, Sore, Crying Pain Intervention(s): Limited activity within patient's tolerance, Monitored during session, Repositioned, Premedicated before session    Home Living Family/patient expects to be discharged to:: Private residence Living Arrangements: Children Available Help at Discharge: Family Type of Home: House Home Access: Stairs to enter Entrance Stairs-Rails: None Secretary/administrator of Steps: 3   Home Layout: One level Home Equipment: None      Prior Function Prior Level of Function : Independent/Modified Independent                     Hand Dominance   Dominant Hand: Right    Extremity/Trunk Assessment   Upper Extremity Assessment Upper Extremity Assessment: Defer to OT evaluation    Lower Extremity Assessment Lower Extremity Assessment: Overall WFL for tasks assessed    Cervical / Trunk Assessment Cervical / Trunk Assessment: Normal  Communication   Communication: No difficulties  Cognition Arousal/Alertness: Awake/alert Behavior During Therapy: WFL for tasks assessed/performed, Anxious Overall Cognitive Status: Within Functional Limits for tasks assessed                                          General Comments      Exercises     Assessment/Plan    PT Assessment Patient needs continued PT services  PT Problem List Decreased strength;Decreased activity tolerance;Decreased balance;Decreased mobility;Decreased safety awareness;Decreased knowledge of precautions;Obesity;Cardiopulmonary status limiting activity       PT Treatment Interventions DME instruction;Gait training;Stair training;Functional mobility training;Therapeutic  activities;Therapeutic exercise;Balance training;Patient/family education    PT Goals (Current goals can be found in the Care Plan section)  Acute Rehab PT Goals Patient Stated Goal: get better and home PT Goal Formulation: With patient Time For Goal Achievement: 02/17/23 Potential to Achieve Goals: Good    Frequency Min 1X/week     Co-evaluation               AM-PAC PT "6 Clicks" Mobility  Outcome Measure Help needed turning from your back to your side while in a flat bed without using bedrails?: A Little Help needed moving from lying on your back to sitting on the side of a flat bed without using bedrails?: A Little Help needed moving to and from a bed to a chair (including a wheelchair)?: A Little Help needed standing up from a chair using your arms (e.g., wheelchair or bedside chair)?: A Little Help needed to walk in hospital room?: A Little Help needed climbing 3-5 steps with a railing? : A Lot 6 Click Score: 17    End of Session Equipment Utilized During Treatment: Gait belt Activity Tolerance: Patient tolerated treatment well Patient left: with call bell/phone within reach;in chair;with chair alarm set Nurse Communication: Mobility status PT Visit Diagnosis: Unsteadiness on feet (R26.81);Muscle weakness (generalized) (M62.81);Difficulty in walking, not elsewhere classified (R26.2)         02/03/23 0936  PT Time Calculation  PT Start Time (ACUTE ONLY) 2703  PT Stop Time (ACUTE ONLY) 0954  PT Time Calculation (min) (ACUTE ONLY) 28 min  PT General Charges  $$ ACUTE PT VISIT 1 Visit  PT Evaluation  $PT Eval Low Complexity 1 Low  PT Treatments  $Gait Training 8-22 mins      Wynn Maudlin, DPT Acute Rehabilitation Services Office [phone]  02/03/23 1:59 PM

## 2023-02-03 NOTE — Assessment & Plan Note (Signed)
Initiating doxycycline as noted above

## 2023-02-03 NOTE — Progress Notes (Signed)
PROGRESS NOTE   YATANA PIPE  VFM:[id number] DOB: [birth year] DOA: 02/01/2023 PCP: Fleet Contras, MD    Brief Narrative:  52 y.o. female with medical history significant of asthma, former tobacco abuse, history of left breast cancer, anxiety disorder, history of previous radiation therapy and seasonal allergies who presented to [facility] emergency department with complaints of shortness of breath cough and wheezing.    Upon evaluation in the emergency department patient received intravenous Solu-Medrol and nebulized bronchodilators.  Patient was clinically felt to be suffering from an acute asthma exacerbation and the hospitalist group was then called to assess the patient for admission to the hospital.   Assessment and Plan: * Severe persistent asthma with (acute) exacerbation Slow improvement in symptoms Considering concurrent marked elevation in BNP I am concerned for some degree of mild complicating pulmonary edema/acute congestive heart failure  also treating for possible concurrent acute bacterial bronchitis with antibiotics. several month history of what seems to be uncontrolled severe persistent asthma Continue to treat patient with bronchodilator therapy and systemic steroids Placing patient on trial regimen of intravenous diuretics Obtaining echocardiogram to evaluate possibility of congestive heart failure Supplemental oxygen as necessary for bouts of hypoxia  Acute bacterial bronchitis Doxycycline twice daily as noted above  Musculoskeletal chest pain Improving with improving respiratory status chest pain from increased work of breathing and frequent coughing.  Unlikely to be cardiac in etiology.  Hyperglycemia Likely secondary to systemic steroids Hemoglobin A1c 5.8% suggestive of prediabetes  Leukocytosis Likely caused by steroids No evidence of pneumonia on serial chest imaging although we are treating for possible concurrent acute bacterial  bronchitis.  Nicotine dependence, cigarettes, uncomplicated Recognized patient's efforts to quit and encouraged to continued cessation    Subjective:  Patient continues to complain of  shortness of breath with associated wheezing although symptoms are improved since yesterday.  Symptoms are still moderate to severe in intensity, worse with exertion and improved with rest.  Patient still continuing to experience a nonproductive cough however this is also slightly improved.  Patient's chest discomfort is resolving.    Physical Exam:  Vitals:   02/02/23 1408 02/02/23 1636 02/02/23 2151   BP: 117/76  129/86   Pulse: 85  72   Resp: (!) 23  16   Temp: 98 F (36.7 C)  98.2 F (36.8 C)   TempSrc: Oral  Oral   SpO2: 96% 99% 99%   Weight:      Height:         Constitutional: Awake alert and oriented x3, patient is in mild respiratory distress. Skin: no rashes, no lesions, good skin turgor noted. Eyes: Pupils are equally reactive to light.  No evidence of scleral icterus or conjunctival pallor.  ENMT: Moist mucous membranes noted.  Posterior pharynx clear of any exudate or lesions.   Respiratory: Patient continuing to exhibit expiratory wheezing in all fields however this is decreased in intensity compared to yesterday.  No evidence of associated rales.  Patient is in respiratory distress without evidence of accessory muscle use.   Chest: Reproducible chest tenderness without crepitus or deformity. Cardiovascular: Regular rate and rhythm, no murmurs / rubs / gallops. No extremity edema. 2+ pedal pulses. No carotid bruits.  Abdomen: Abdomen is soft and nontender.  No evidence of intra-abdominal masses.  Positive bowel sounds noted in all quadrants.   Musculoskeletal: No joint deformity upper and lower extremities. Good ROM, no contractures. Normal muscle tone.    Data Reviewed:  I have personally reviewed and  interpreted labs, imaging.  Significant findings are   CBC: Recent Labs   Lab 02/01/23 1905 02/01/23 2153 02/02/23 0503 02/03/23 0940   WBC 15.1* 13.2* 8.0 14.9*   NEUTROABS 8.6*  --   --  12.8*   HGB 14.6 14.6 13.2 13.6   HCT 44.1 42.9 39.8 42.3   MCV 84.5 83.3 85.2 88.5   PLT 383 332 300 331    Basic Metabolic Panel: Recent Labs  Lab 02/01/23 1905 02/01/23 2153 02/02/23 0503 02/03/23 0940   NA 136  --  133* 136   K 3.5  --  3.7 4.7   CL 103  --  105 104   CO2 20*  --  20* 23   GLUCOSE 125*  --  196* 210*   BUN 12  --  15 17   CREATININE 1.22* 1.28* 1.29* 1.05*   CALCIUM 9.2  --  8.6* 9.3   MG  --   --   --  2.3    GFR: Estimated Creatinine Clearance: 65.2 mL/min (A) (by C-G formula based on SCr of 1.11 mg/dL (H)). Liver Function Tests: Recent Labs  Lab 02/02/23 0503 02/03/23 0940   AST 22 15   ALT 18 14   ALKPHOS 112 113   BILITOT 0.6 0.4   PROT 7.4 8.4*   ALBUMIN 3.6 4.1      Code Status:  Full code.   Family Communication: 2 daughters at the bedside who were updated on plan of care on 4/7.   Severity of Illness:  The appropriate patient status for this patient is INPATIENT. Inpatient status is judged to be reasonable and necessary in order to provide the required intensity of service to ensure the patient's safety. The patient's presenting symptoms, physical exam findings, and initial radiographic and laboratory data in the context of their chronic comorbidities is felt to place them at high risk for further clinical deterioration. Furthermore, it is not anticipated that the patient will be medically stable for discharge from the hospital within 2 midnights of admission.   * I certify that at the point of admission it is my clinical judgment that the patient will require inpatient hospital care spanning beyond 2 midnights from the point of admission due to high intensity of service, high risk for further deterioration and high frequency of surveillance required.*  Time spent:  50 minutes  Author:  Marinda ElkGeorge J Terrin Imparato  MD

## 2023-02-03 NOTE — Plan of Care (Signed)
  Problem: Clinical Measurements: Goal: Respiratory complications will improve Outcome: Not Progressing   Problem: Activity: Goal: Risk for activity intolerance will decrease Outcome: Not Progressing   

## 2023-02-03 NOTE — Progress Notes (Signed)
Initial Nutrition Assessment  DOCUMENTATION CODES:   Obesity unspecified  INTERVENTION:   -Boost Breeze po TID, each supplement provides 250 kcal and 9 grams of protein  -Needs updated weight for admission  -Multivitamin with minerals daily  -Consider bowel regimen if does not have BM in the next 24 hours  NUTRITION DIAGNOSIS:   Inadequate oral intake related to poor appetite as evidenced by per patient/family report.  GOAL:   Patient will meet greater than or equal to 90% of their needs  MONITOR:   PO intake, Supplement acceptance, Labs, Weight trends, I & O's  REASON FOR ASSESSMENT:   Consult Assessment of nutrition requirement/status  ASSESSMENT:   52 y.o. female with medical history significant of asthma, former tobacco abuse, history of left breast cancer, anxiety disorder, history of previous radiation therapy and seasonal allergies who presented to [facility] emergency department with complaints of shortness of breath cough and wheezing.     Admitted for asthma exacerbation.  Patient in room, sitting in chair. Seems fatigued, very hoarse. RN had just given pt some cough medicine.  Reports she struggles to eat at home d/t fatigue and constipation. States she sometimes goes a week without eating anything, states this occurred ~2 weeks ago. Pt doesn't use any protein supplements, her family has bought them for her, she didn't like them. RD to order Boost Breeze for pt this admission given poor PO lately.  Pt reports she thinks she would eat better here if she could have a BM. Reports no BM for 4 days, last recorded BM is on 4/6 (2 days ago). Monitor for BM and consider regimen if no BM soon. Pt did eat some cereal with milk and a muffin this morning for breakfast. States her kids have been bringing her food too.  Per weight records, weight recorded is the same as weight from 3/13. Will order new weight to ensure accuracy.   Medications reviewed.  Labs  reviewed.  NUTRITION - FOCUSED PHYSICAL EXAM:  Flowsheet Row Most Recent Value  Orbital Region No depletion  Upper Arm Region No depletion  Thoracic and Lumbar Region No depletion  Buccal Region No depletion  Temple Region No depletion  Clavicle Bone Region No depletion  Clavicle and Acromion Bone Region No depletion  Scapular Bone Region No depletion  Dorsal Hand Mild depletion  Patellar Region No depletion  Anterior Thigh Region No depletion  Posterior Calf Region No depletion  Edema (RD Assessment) None  Hair Reviewed  Eyes Reviewed  Mouth Reviewed  Skin Reviewed  Nails Reviewed       Diet Order:   Diet Order             Diet Heart Room service appropriate? Yes; Fluid consistency: Thin  Diet effective now                   EDUCATION NEEDS:   No education needs have been identified at this time  Skin:  Skin Assessment: Reviewed RN Assessment  Last BM:  4/6  Height:   Ht Readings from Last 1 Encounters:  02/01/23 5\' 2"  (1.575 m)    Weight:   Wt Readings from Last 1 Encounters:  02/01/23 99.8 kg    BMI:  Body mass index is 40.24 kg/m.  Estimated Nutritional Needs:   Kcal:  1500-1700  Protein:  75-90g  Fluid:  1.7L/day  Tilda Franco, MS, RD, LDN Inpatient Clinical Dietitian Contact information available via Amion

## 2023-02-03 NOTE — Assessment & Plan Note (Signed)
Recognized patient's efforts to quit in the past few months and encouraged patient for continued cessation

## 2023-02-03 NOTE — Assessment & Plan Note (Signed)
Several month history of what seems to be uncontrolled severe persistent asthma Symptoms have persisted despite cessation of smoking several months ago Aggressively managing patient with bronchodilator therapy and systemic steroids Considering patient's significant symptoms obtain and repeat chest x-ray which revealed no evidence of pneumonia.  Patient is possibly suffering from acute bacterial bronchitis. Supplemental oxygen as necessary for bouts of hypoxia

## 2023-02-03 NOTE — Progress Notes (Signed)
Occupational Therapy Treatment Patient Details Name: Yolanda White MRN: [id number] DOB: 04-03-1971 Today's Date: 02/03/2023   History of present illness Patient is 52 y.o. female presented to [facility] ED with c/o SOB, cough, and wheezing. Upon evaluation in the emergency department patient received intravenous Solu-Medrol and nebulized bronchodilators.  Patient was clinically felt to be suffering from an acute asthma exacerbation. PMH significant of asthma, former tobacco abuse, history of left breast cancer, anxiety disorder, history of previous radiation therapy and seasonal allergies.   OT comments  Patient progressing and showed improved ability for OOB activities and briefly tried standing at sink for oral care, but had to sit mid-way through task due to increasing fatigue and body becoming tremulous.   Pt is tolerating activity better overall, compared to previous session and verbalized feeling motivated to get better. Handouts and education on energy conservation initiated and pt very receptive to information.   Patient remains limited by generalized weakness and decreased activity tolerance along with deficits noted below. Pt continues to demonstrate good rehab potential and would benefit from continued skilled OT to increase safety and independence with ADLs and functional transfers to allow pt to return home safely and reduce caregiver burden and fall risk.    Recommendations for follow up therapy are one component of a multi-disciplinary discharge planning process, led by the attending physician.  Recommendations may be updated based on patient status, additional functional criteria and insurance authorization.    Assistance Recommended at Discharge    Patient can return home with the following      Equipment Recommendations       Recommendations for Other Services      Precautions / Restrictions Precautions Precautions: Fall Precaution Comments: shortness of  breath Restrictions Weight Bearing Restrictions: No       Mobility Bed Mobility               General bed mobility comments: Pt received in recliner    Transfers                         Balance Overall balance assessment: (P) Needs assistance                                         ADL either performed or assessed with clinical judgement   ADL Overall ADL's : Needs assistance/impaired     Grooming: Standing;Sitting;Oral care;Wash/dry hands;Set up;Min guard Grooming Details (indicate cue type and reason): Pt began oral care standing at sink. Due to fatigue, pt rested forearms on vanity and stood stooped over sink with Min guard for safety. During brushing pt became tremulous and invited to complete oral care in chair. Pt then sat and completed oral care as well as face wash/dry with setup.                 Toilet Transfer: Charity fundraiser Details (indicate cue type and reason): Pt stood from recliner with Min As. Pt pivoted to sink, then pivoted to recliner with Min guard assist.   Toileting - Clothing Manipulation Details (indicate cue type and reason): Denied need to void.     Functional mobility during ADLs: Minimal assistance;Min guard      Extremity/Trunk Assessment Upper Extremity Assessment Upper Extremity Assessment: Defer to OT evaluation;Overall [facility] for tasks assessed   Lower Extremity Assessment Lower Extremity Assessment: Overall [facility]  for tasks assessed   Cervical / Trunk Assessment Cervical / Trunk Assessment: Normal    Vision   Vision Assessment?: No apparent visual deficits   Perception     Praxis      Cognition                                                Exercises Other Exercises Other Exercises: Handout on energy conservation provided with discussion of basic principles and examples. Pt verbalized understanding and agreed to read handouts.    Shoulder  Instructions       General Comments      Pertinent Vitals/ Pain       Pain Assessment Pain Assessment: Faces Faces Pain Scale: Hurts even more Pain Location: chest pain related to coughing  Home Living Family/patient expects to be discharged to:: Private residence Living Arrangements: Children Available Help at Discharge: Family Type of Home: House Home Access: Stairs to enter Secretary/administrator of Steps: 3 Entrance Stairs-Rails: None Home Layout: One level     Bathroom Shower/Tub: Tub/shower unit;Walk-in shower   Bathroom Toilet: Standard Bathroom Accessibility: Yes   Home Equipment: None          Prior Functioning/Environment              Frequency           Progress Toward Goals  OT Goals(current goals can now be found in the care plan section)  Progress towards OT goals: Progressing toward goals  Acute Rehab OT Goals Patient Stated Goal: Be strong enough to enjoy grandchildren OT Goal Formulation: With patient Time For Goal Achievement: 02/16/23 Potential to Achieve Goals: Good  Plan      Co-evaluation                 AM-PAC OT "6 Clicks" Daily Activity     Outcome Measure   Help from another person eating meals?: None Help from another person taking care of personal grooming?: None Help from another person toileting, which includes using toliet, bedpan, or urinal?: A Lot Help from another person bathing (including washing, rinsing, drying)?: A Little Help from another person to put on and taking off regular upper body clothing?: A Little Help from another person to put on and taking off regular lower body clothing?: A Lot 6 Click Score: 18    End of Session    OT Visit Diagnosis: Unsteadiness on feet (R26.81);Muscle weakness (generalized) (M62.81)   Activity Tolerance Patient limited by fatigue   Patient Left in chair;with chair alarm set;with call bell/phone within reach   Nurse Communication          Time:  2334-3568 OT Time Calculation (min): 16 min  Charges: OT General Charges $OT Visit: 1 Visit OT Treatments $Self Care/Home Management : 8-22 mins  Victorino Dike, OT Acute Rehab Services Office: [phone] 02/03/2023   Theodoro Clock 02/03/2023, 1:26 PM

## 2023-02-03 NOTE — Assessment & Plan Note (Signed)
Likely caused by steroids Obtaining repeat chest x-ray

## 2023-02-03 NOTE — Assessment & Plan Note (Signed)
Likely secondary to systemic steroids Obtaining hemoglobin A1c

## 2023-02-03 NOTE — Assessment & Plan Note (Signed)
Very slow improvement daily with improving respiratory status. chest pain thought be secondary to increased work of breathing and frequent coughing.  Unlikely to be cardiac in etiology. Cardiac enzymes unremarkable

## 2023-02-04 ENCOUNTER — Inpatient Hospital Stay (HOSPITAL_COMMUNITY): Payer: Medicare Other

## 2023-02-04 DIAGNOSIS — R7303 Prediabetes: Secondary | ICD-10-CM

## 2023-02-04 DIAGNOSIS — R0602 Shortness of breath: Secondary | ICD-10-CM | POA: Diagnosis not present

## 2023-02-04 DIAGNOSIS — R7989 Other specified abnormal findings of blood chemistry: Secondary | ICD-10-CM

## 2023-02-04 DIAGNOSIS — J208 Acute bronchitis due to other specified organisms: Secondary | ICD-10-CM | POA: Diagnosis not present

## 2023-02-04 DIAGNOSIS — I509 Heart failure, unspecified: Secondary | ICD-10-CM | POA: Diagnosis not present

## 2023-02-04 DIAGNOSIS — J4551 Severe persistent asthma with (acute) exacerbation: Secondary | ICD-10-CM | POA: Diagnosis not present

## 2023-02-04 DIAGNOSIS — R0789 Other chest pain: Secondary | ICD-10-CM | POA: Diagnosis not present

## 2023-02-04 LAB — ECHOCARDIOGRAM COMPLETE
Area-P 1/2: 3.46 cm2
Calc EF: 69.3 %
Height: 62 in
S' Lateral: 2.2 cm
Single Plane A2C EF: 67 %
Single Plane A4C EF: 72.7 %
Weight: 3358.4 oz

## 2023-02-04 LAB — RESPIRATORY PANEL BY PCR

## 2023-02-04 LAB — CBC WITH DIFFERENTIAL/PLATELET
Abs Immature Granulocytes: 0.06 10*3/uL (ref 0.00–0.07)
Basophils Absolute: 0.1 10*3/uL (ref 0.0–0.1)
Basophils Relative: 0 %
Eosinophils Absolute: 0.1 10*3/uL (ref 0.0–0.5)
Eosinophils Relative: 0 %
HCT: 38.4 % (ref 36.0–46.0)
Hemoglobin: 12.5 g/dL (ref 12.0–15.0)
Immature Granulocytes: 0 %
Lymphocytes Relative: 28 %
Lymphs Abs: 3.9 10*3/uL (ref 0.7–4.0)
MCH: 28.1 pg (ref 26.0–34.0)
MCHC: 32.6 g/dL (ref 30.0–36.0)
MCV: 86.3 fL (ref 80.0–100.0)
Monocytes Absolute: 1 10*3/uL (ref 0.1–1.0)
Monocytes Relative: 7 %
Neutro Abs: 8.8 10*3/uL — ABNORMAL HIGH (ref 1.7–7.7)
Neutrophils Relative %: 65 %
Platelets: 271 10*3/uL (ref 150–400)
RBC: 4.45 MIL/uL (ref 3.87–5.11)
RDW: 12.6 % (ref 11.5–15.5)
WBC: 13.7 10*3/uL — ABNORMAL HIGH (ref 4.0–10.5)
nRBC: 0 % (ref 0.0–0.2)

## 2023-02-04 LAB — MAGNESIUM: Magnesium: 1.9 mg/dL (ref 1.7–2.4)

## 2023-02-04 LAB — COMPREHENSIVE METABOLIC PANEL
ALT: 14 U/L (ref 0–44)
AST: 12 U/L — ABNORMAL LOW (ref 15–41)
Albumin: 3.3 g/dL — ABNORMAL LOW (ref 3.5–5.0)
Alkaline Phosphatase: 88 U/L (ref 38–126)
Anion gap: 8 (ref 5–15)
BUN: 21 mg/dL — ABNORMAL HIGH (ref 6–20)
CO2: 24 mmol/L (ref 22–32)
Calcium: 8.4 mg/dL — ABNORMAL LOW (ref 8.9–10.3)
Chloride: 103 mmol/L (ref 98–111)
Creatinine, Ser: 1.11 mg/dL — ABNORMAL HIGH (ref 0.44–1.00)
GFR, Estimated: >60 mL/min (ref >60)
Glucose, Bld: 229 mg/dL — ABNORMAL HIGH (ref 70–99)
Potassium: 3.3 mmol/L — ABNORMAL LOW (ref 3.5–5.1)
Sodium: 135 mmol/L (ref 135–145)
Total Bilirubin: 0.4 mg/dL (ref 0.3–1.2)
Total Protein: 6.6 g/dL (ref 6.5–8.1)

## 2023-02-04 MED ORDER — IOHEXOL 350 MG/ML SOLN
75.0000 mL | Freq: Once | INTRAVENOUS | Status: AC | PRN
  Administered 2023-02-04: 75 mL via INTRAVENOUS

## 2023-02-04 MED ORDER — IPRATROPIUM-ALBUTEROL 0.5-2.5 (3) MG/3ML IN SOLN
3.0000 mL | Freq: Three times a day (TID) | RESPIRATORY_TRACT | Status: DC
  Administered 2023-02-05 (×3): 3 mL via RESPIRATORY_TRACT
  Filled 2023-02-04 (×3): qty 3

## 2023-02-04 MED ORDER — POTASSIUM CHLORIDE CRYS ER 20 MEQ PO TBCR
40.0000 meq | EXTENDED_RELEASE_TABLET | Freq: Once | ORAL | Status: AC
  Administered 2023-02-04: 40 meq via ORAL
  Filled 2023-02-04: qty 2

## 2023-02-04 NOTE — TOC Initial Note (Signed)
Transition of Care [facility]) - Initial/Assessment Note    Patient Details  Name: Yolanda White MRN: [id number] Date of Birth: [birth year]  Transition of Care [facility]) CM/SW Contact:    Howell Rucks, RN Phone Number: 02/04/2023, 2:40 PM  Clinical Narrative:  Met with pt in room to discuss dc plans. Agreed to [facility] care/DME, no preference. [facility] HHPT/OT rep Tresa Endo; [employer] [facility]- rep Vaughan Basta to deliver to room prior dc. Has own transport home.                  Expected Discharge Plan: Home w Home Health Services Barriers to Discharge: Continued Medical Work up   Patient Goals and CMS Choice Patient states their goals for this hospitalization and ongoing recovery are:: Home with [facility] PT/OT , [facility] CMS Medicare.gov Compare Post Acute Care list provided to:: Patient Choice offered to / list presented to : Patient [employer] ownership interest in [facility].provided to:: Patient    Expected Discharge Plan and Services   Discharge Planning Services: CM Consult   Living arrangements for the past 2 months: Single Family Home                 DME Arranged: Walker rolling with seat DME Agency: [employer] Date DME Agency Contacted: 02/04/23 Time DME Agency Contacted: 1436 Representative spoke with at DME Agency: Vaughan Basta HH Arranged: PT, OT HH Agency: [facility] Date [facility] Agency Contacted: 02/04/23 Time HH Agency Contacted: 1437 Representative spoke with at [facility] Agency: Hassel Neth  Prior Living Arrangements/Services Living arrangements for the past 2 months: Single Family Home Lives with:: Adult Children Patient language and need for interpreter reviewed:: Yes Do you feel safe going back to the place where you live?: Yes      Need for Family Participation in Patient Care: Yes (Comment) Care giver support system in place?: Yes (comment)   Criminal Activity/Legal Involvement Pertinent to Current Situation/Hospitalization: No - Comment as  needed  Activities of Daily Living Home Assistive Devices/Equipment: Nebulizer, CBG Meter, Blood pressure cuff ADL Screening (condition at time of admission) Patient's cognitive ability adequate to safely complete daily activities?: Yes Is the patient deaf or have difficulty hearing?: No Does the patient have difficulty seeing, even when wearing glasses/contacts?: No Does the patient have difficulty concentrating, remembering, or making decisions?: No Patient able to express need for assistance with ADLs?: Yes Does the patient have difficulty dressing or bathing?: Yes Independently performs ADLs?: Yes (appropriate for developmental age) Does the patient have difficulty walking or climbing stairs?: Yes Weakness of Legs: None Weakness of Arms/Hands: None  Permission Sought/Granted Permission sought to share information with : Case Manager Permission granted to share information with : Yes, Verbal Permission Granted  Share Information with NAME: Fannie Knee, RN           Emotional Assessment Appearance:: Appears stated age Attitude/Demeanor/Rapport: Gracious Affect (typically observed): Accepting Orientation: : Oriented to Self, Oriented to Place, Oriented to  Time, Oriented to Situation Alcohol / Substance Use: Not Applicable Psych Involvement: No (comment)  Admission diagnosis:  Moderate persistent asthma with exacerbation [J45.41] Asthma exacerbation [J45.901] Patient Active Problem List   Diagnosis Date Noted   Acute bacterial bronchitis 02/03/2023   Hyperglycemia 02/03/2023   Leukocytosis 02/03/2023   Musculoskeletal chest pain 02/03/2023   Depression 07/09/2016   Back pain 05/06/2016   Severe persistent asthma with (acute) exacerbation 02/06/2016   Emesis    Morbid obesity    Genetic testing 09/26/2015   Nicotine  dependence, cigarettes, uncomplicated 09/21/2015   Severe obesity (BMI >= 40) 09/21/2015   Dyspnea 09/20/2015   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 [place] [address] Phone: [phone] Fax: [phone]  [facility] [place] - [address] [place] [address] Phone: [phone] Fax: [phone]     Social Determinants of Health (SDOH) Social History: SDOH Screenings   Food Insecurity: No Food Insecurity (02/01/2023)  Housing: Low Risk  (02/01/2023)  Transportation Needs: No Transportation Needs (02/01/2023)  Utilities: Not At Risk (02/01/2023)  Tobacco Use: High Risk (02/01/2023)   SDOH Interventions:     Readmission Risk Interventions     No data to display

## 2023-02-04 NOTE — Progress Notes (Signed)
Mobility Specialist - Progress Note   02/04/23 1357  Oxygen Therapy  SpO2 95 %  O2 Device Room Air  Mobility  Activity Ambulated with assistance in hallway  Level of Assistance Standby assist, set-up cues, supervision of patient - no hands on  Assistive Device Front wheel walker  Distance Ambulated (ft) 100 ft  Activity Response Tolerated well  Mobility Referral Yes  $Mobility charge 1 Mobility   Pt received in chair and agreed to mobility.  Nurse requested Mobility Specialist to perform oxygen saturation test with pt which includes removing pt from oxygen both at rest and while ambulating.  Below are the results from that testing.     Patient Saturations on Room Air at Rest = spO2 96%  Patient Saturations on Room Air while Ambulating = sp02 95% .    At end of testing pt left in room on RA.   Pt got dizzy and fuzzy vision, sat down and took a rest. Symptoms subsided and finished session.  Reported results to nurse.   Pt back in bed with all needs met.   Marilynne Halsted Mobility Specialist

## 2023-02-04 NOTE — Plan of Care (Signed)
  Problem: Education: Goal: Knowledge of General Education information will improve Description: Including pain rating scale, medication(s)/side effects and non-pharmacologic comfort measures Outcome: Progressing   Problem: Health Behavior/Discharge Planning: Goal: Ability to manage health-related needs will improve Outcome: Progressing   Problem: Clinical Measurements: Goal: Will remain free from infection Outcome: Progressing Goal: Respiratory complications will improve Outcome: Progressing   Problem: Activity: Goal: Risk for activity intolerance will decrease Outcome: Progressing   Problem: Coping: Goal: Level of anxiety will decrease Outcome: Progressing   

## 2023-02-04 NOTE — Progress Notes (Signed)
OT Cancellation Note  Patient Details Name: Yolanda White MRN: [id number] DOB: [birth year]   Cancelled Treatment:    Reason Eval/Treat Not Completed: Pain limiting ability to participate;Fatigue/lethargy limiting ability to participate: Pt sleeping lightly, awakened to name call, but declined OT due to feeling poorly and increased pain with recent pain meds. Will continue efforts as able.   Theodoro Clock 02/04/2023, 11:06 AM

## 2023-02-04 NOTE — Progress Notes (Incomplete)
PROGRESS NOTE   Yolanda White  MVH:[id number] DOB: 01-18-1971 DOA: 02/01/2023 PCP: Fleet Contras, MD   Date of Service: the patient was seen and examined on 02/04/2023  Brief Narrative:  52 y.o. female with medical history significant of asthma, former tobacco abuse, history of left breast cancer, anxiety disorder, history of previous radiation therapy and seasonal allergies who presented to [facility] emergency department with complaints of shortness of breath cough and wheezing.    Upon evaluation in the emergency department patient received intravenous Solu-Medrol and nebulized bronchodilators.  Patient was clinically felt to be suffering from an acute asthma exacerbation and the hospitalist group was then called to assess the patient for admission to the hospital.   Assessment and Plan: * Severe persistent asthma with (acute) exacerbation Slow improvement in symptoms Considering concurrent marked elevation in BNP I am concerned for some degree of mild complicating pulmonary edema/acute congestive heart failure  also treating for possible concurrent acute bacterial bronchitis with antibiotics. several month history of what seems to be uncontrolled severe persistent asthma Continue to treat patient with bronchodilator therapy and systemic steroids Placing patient on trial regimen of intravenous diuretics Obtaining echocardiogram to evaluate possibility of congestive heart failure Supplemental oxygen as necessary for bouts of hypoxia  Acute bacterial bronchitis Doxycycline twice daily as noted above  Musculoskeletal chest pain Improving with improving respiratory status chest pain from increased work of breathing and frequent coughing.  Unlikely to be cardiac in etiology.  Hyperglycemia Likely secondary to systemic steroids Hemoglobin A1c 5.8% suggestive of prediabetes  Leukocytosis Likely caused by steroids No evidence of pneumonia on serial chest imaging although  we are treating for possible concurrent acute bacterial bronchitis.  Nicotine dependence, cigarettes, uncomplicated Recognized patient's efforts to quit and encouraged to continued cessation        Subjective:  ***  Physical Exam:  Vitals:   02/03/23 1409 02/03/23 2107 02/04/23 0541 02/04/23 0756  BP:  119/70 114/63   Pulse:  81 70   Resp:  18 17   Temp:  97.9 F (36.6 C) 98 F (36.7 C)   TempSrc:  Oral Oral   SpO2: 97% 98% 98% 97%  Weight:      Height:        *** Constitutional: Awake alert and oriented x3, no associated distress.   Skin: no rashes, no lesions, good skin turgor noted. Eyes: Pupils are equally reactive to light.  No evidence of scleral icterus or conjunctival pallor.  ENMT: Moist mucous membranes noted.  Posterior pharynx clear of any exudate or lesions.   Respiratory: clear to auscultation bilaterally, no wheezing, no crackles. Normal respiratory effort. No accessory muscle use.  Cardiovascular: Regular rate and rhythm, no murmurs / rubs / gallops. No extremity edema. 2+ pedal pulses. No carotid bruits.  Abdomen: Abdomen is soft and nontender.  No evidence of intra-abdominal masses.  Positive bowel sounds noted in all quadrants.   Musculoskeletal: No joint deformity upper and lower extremities. Good ROM, no contractures. Normal muscle tone.    Data Reviewed:  I have personally reviewed and interpreted labs, imaging.  Significant findings are ***  CBC: Recent Labs  Lab 02/01/23 1905 02/01/23 2153 02/02/23 0503 02/03/23 0940 02/04/23 0523  WBC 15.1* 13.2* 8.0 14.9* 13.7*  NEUTROABS 8.6*  --   --  12.8* 8.8*  HGB 14.6 14.6 13.2 13.6 12.5  HCT 44.1 42.9 39.8 42.3 38.4  MCV 84.5 83.3 85.2 88.5 86.3  PLT 383 332 300 331 271  Basic Metabolic Panel: Recent Labs  Lab 02/01/23 1905 02/01/23 2153 02/02/23 0503 02/03/23 0940 02/04/23 0523  NA 136  --  133* 136 135  K 3.5  --  3.7 4.7 3.3*  CL 103  --  105 104 103  CO2 20*  --  20* 23 24   GLUCOSE 125*  --  196* 210* 229*  BUN 12  --  15 17 21*  CREATININE 1.22* 1.28* 1.29* 1.05* 1.11*  CALCIUM 9.2  --  8.6* 9.3 8.4*  MG  --   --   --  2.3 1.9   GFR: Estimated Creatinine Clearance: 65.2 mL/min (A) (by C-G formula based on SCr of 1.11 mg/dL (H)). Liver Function Tests: Recent Labs  Lab 02/02/23 0503 02/03/23 0940 02/04/23 0523  AST 22 15 12*  ALT 18 14 14   ALKPHOS 112 113 88  BILITOT 0.6 0.4 0.4  PROT 7.4 8.4* 6.6  ALBUMIN 3.6 4.1 3.3*    Coagulation Profile: No results for input(s): "INR", "PROTIME" in the last 168 hours.   EKG/Telemetry: Personally reviewed.  Rhythm is *** with heart rate of ***.  No dynamic ST segment changes appreciated.   Code Status:  {Palliative Code status:23503}.  Code status decision has been confirmed with: *** Family Communication: ***    Severity of Illness:  {Observation/Inpatient:21159}  Time spent:  *** minutes  Author:  Marinda Elk MD  02/04/2023 9:27 AM

## 2023-02-05 DIAGNOSIS — J4551 Severe persistent asthma with (acute) exacerbation: Secondary | ICD-10-CM | POA: Diagnosis not present

## 2023-02-05 DIAGNOSIS — R7989 Other specified abnormal findings of blood chemistry: Secondary | ICD-10-CM | POA: Diagnosis present

## 2023-02-05 DIAGNOSIS — E876 Hypokalemia: Secondary | ICD-10-CM | POA: Diagnosis present

## 2023-02-05 LAB — COMPREHENSIVE METABOLIC PANEL
ALT: 19 U/L (ref 0–44)
AST: 16 U/L (ref 15–41)
Albumin: 3.7 g/dL (ref 3.5–5.0)
Alkaline Phosphatase: 100 U/L (ref 38–126)
Anion gap: 9 (ref 5–15)
BUN: 21 mg/dL — ABNORMAL HIGH (ref 6–20)
CO2: 24 mmol/L (ref 22–32)
Calcium: 8.9 mg/dL (ref 8.9–10.3)
Chloride: 103 mmol/L (ref 98–111)
Creatinine, Ser: 1.03 mg/dL — ABNORMAL HIGH (ref 0.44–1.00)
GFR, Estimated: >60 mL/min (ref >60)
Glucose, Bld: 147 mg/dL — ABNORMAL HIGH (ref 70–99)
Potassium: 3.4 mmol/L — ABNORMAL LOW (ref 3.5–5.1)
Sodium: 136 mmol/L (ref 135–145)
Total Bilirubin: 0.5 mg/dL (ref 0.3–1.2)
Total Protein: 7.5 g/dL (ref 6.5–8.1)

## 2023-02-05 LAB — CBC WITH DIFFERENTIAL/PLATELET
Abs Immature Granulocytes: 0.07 10*3/uL (ref 0.00–0.07)
Basophils Absolute: 0.1 10*3/uL (ref 0.0–0.1)
Basophils Relative: 1 %
Eosinophils Absolute: 0.1 10*3/uL (ref 0.0–0.5)
Eosinophils Relative: 1 %
HCT: 43 % (ref 36.0–46.0)
Hemoglobin: 14.2 g/dL (ref 12.0–15.0)
Immature Granulocytes: 0 %
Lymphocytes Relative: 36 %
Lymphs Abs: 5.8 10*3/uL — ABNORMAL HIGH (ref 0.7–4.0)
MCH: 28.2 pg (ref 26.0–34.0)
MCHC: 33 g/dL (ref 30.0–36.0)
MCV: 85.5 fL (ref 80.0–100.0)
Monocytes Absolute: 1.2 10*3/uL — ABNORMAL HIGH (ref 0.1–1.0)
Monocytes Relative: 7 %
Neutro Abs: 8.7 10*3/uL — ABNORMAL HIGH (ref 1.7–7.7)
Neutrophils Relative %: 55 %
Platelets: 346 10*3/uL (ref 150–400)
RBC: 5.03 MIL/uL (ref 3.87–5.11)
RDW: 12.6 % (ref 11.5–15.5)
WBC: 15.9 10*3/uL — ABNORMAL HIGH (ref 4.0–10.5)
nRBC: 0 % (ref 0.0–0.2)

## 2023-02-05 LAB — MAGNESIUM: Magnesium: 2 mg/dL (ref 1.7–2.4)

## 2023-02-05 MED ORDER — IPRATROPIUM-ALBUTEROL 0.5-2.5 (3) MG/3ML IN SOLN
3.0000 mL | Freq: Two times a day (BID) | RESPIRATORY_TRACT | Status: DC
  Administered 2023-02-06: 3 mL via RESPIRATORY_TRACT
  Filled 2023-02-05: qty 3

## 2023-02-05 MED ORDER — MELATONIN 5 MG PO TABS
5.0000 mg | ORAL_TABLET | Freq: Every evening | ORAL | Status: DC | PRN
  Administered 2023-02-05: 5 mg via ORAL
  Filled 2023-02-05: qty 1

## 2023-02-05 NOTE — Progress Notes (Signed)
Occupational Therapy Treatment Patient Details Name: [facility] MRN: [id number] DOB: 1971/04/03 Today's Date: 02/05/2023   History of present illness Patient is 52 y.o. female presented to [facility] with c/o SOB, cough, and wheezing. Upon evaluation in the emergency department patient received intravenous Solu-Medrol and nebulized bronchodilators.  Patient was clinically felt to be suffering from an acute asthma exacerbation. PMH significant of asthma, former tobacco abuse, history of left breast cancer, anxiety disorder, history of previous radiation therapy and seasonal allergies.   OT comments  Pt progressing towards OT goals this session. Pt recently worked with PT and still agreeable to OT session. Focused on grooming in standing, standing tolerance for ADL. Pt overall min guard to min A for standing ADL and transfers with Rollator. Education for safety with rollator. No complaints of SOB or lightheadedness. OT will continue to follow acutely and POC remains appropriate at this time.  Of Note: Please consider a chaplain visit referral for this patient. She is concerned about her oldest daughter losing her job, paying rent, 2 of her grandchildren have special needs. She mentioned being down and would likely benefit from a listening ear/compassion.    Recommendations for follow up therapy are one component of a multi-disciplinary discharge planning process, led by the attending physician.  Recommendations may be updated based on patient status, additional functional criteria and insurance authorization.    Assistance Recommended at Discharge Intermittent Supervision/Assistance  Patient can return home with the following  Assistance with cooking/housework;A little help with bathing/dressing/bathroom;A little help with walking and/or transfers   Equipment Recommendations  BSC/3in1    Recommendations for Other Services PT consult    Precautions / Restrictions Precautions Precautions:  Fall Precaution Comments: shortness of breath Restrictions Weight Bearing Restrictions: No       Mobility Bed Mobility               General bed mobility comments: Pt in chair at beginning and end of session    Transfers Overall transfer level: Needs assistance Equipment used: Rollator (4 wheels) Transfers: Sit to/from Stand, Bed to chair/wheelchair/BSC Sit to Stand: Min guard     Step pivot transfers: Min guard     General transfer comment: vc to lock rollator     Balance Overall balance assessment: Needs assistance Sitting-balance support: Feet supported Sitting balance-Leahy Scale: Good     Standing balance support: During functional activity, Bilateral upper extremity supported Standing balance-Leahy Scale: Poor Standing balance comment: reliant on at external assist wit RW or HHA from therapist.                           ADL either performed or assessed with clinical judgement   ADL Overall ADL's : Needs assistance/impaired     Grooming: Wash/dry face;Oral care;Min guard;Standing Grooming Details (indicate cue type and reason): stood the whole time, LLE shaking with standing, but able to make it through all oral care and face washing needs                 Toilet Transfer: Diplomatic Services operational officer (4 wheels)   Toileting- Architect and Hygiene: Min guard;Sit to/from stand       Functional mobility during ADLs: Diplomatic Services operational officer (4 wheels) General ADL Comments: overall low affect, feeling nauseous    Extremity/Trunk Assessment Upper Extremity Assessment Upper Extremity Assessment: Overall WFL for tasks assessed   Lower Extremity Assessment Lower Extremity Assessment: Defer to PT evaluation  Vision       Perception     Praxis      Cognition Arousal/Alertness: Awake/alert Behavior During Therapy: WFL for tasks assessed/performed, Anxious, Flat affect Overall Cognitive Status: Within Functional Limits for  tasks assessed                                 General Comments: Pt overall seeming depressed mood - worried about paying rent as her oldest daughter lost her job.        Exercises Other Exercises Other Exercises: Pt might benefit from Chaplain consult    Shoulder Instructions       General Comments      Pertinent Vitals/ Pain       Pain Assessment Pain Assessment: Faces Faces Pain Scale: No hurt Pain Intervention(s): Monitored during session, Limited activity within patient's tolerance, Repositioned  Home Living                                          Prior Functioning/Environment              Frequency  Min 2X/week        Progress Toward Goals  OT Goals(current goals can now be found in the care plan section)  Progress towards OT goals: Progressing toward goals  Acute Rehab OT Goals Patient Stated Goal: be strong enough to enjoy grandchildren OT Goal Formulation: With patient Time For Goal Achievement: 02/16/23 Potential to Achieve Goals: Good  Plan Discharge plan remains appropriate;Frequency remains appropriate    Co-evaluation                 AM-PAC OT "6 Clicks" Daily Activity     Outcome Measure   Help from another person eating meals?: None Help from another person taking care of personal grooming?: None Help from another person toileting, which includes using toliet, bedpan, or urinal?: A Lot Help from another person bathing (including washing, rinsing, drying)?: A Little Help from another person to put on and taking off regular upper body clothing?: A Little Help from another person to put on and taking off regular lower body clothing?: A Lot 6 Click Score: 18    End of Session Equipment Utilized During Treatment: Rollator (4 wheels)  OT Visit Diagnosis: Unsteadiness on feet (R26.81);Muscle weakness (generalized) (M62.81)   Activity Tolerance Patient limited by fatigue (nausea)   Patient Left in  chair;with chair alarm set;with call bell/phone within reach   Nurse Communication Mobility status        Time: 1694-5038 OT Time Calculation (min): 26 min  Charges: OT General Charges $OT Visit: 1 Visit OT Treatments $Self Care/Home Management : 8-22 mins $Therapeutic Activity: 8-22 mins  Nyoka Cowden OTR/L Acute Rehabilitation Services Office: [phone]  Evern Bio [facility] 02/05/2023, 1:33 PM

## 2023-02-05 NOTE — Progress Notes (Signed)
PROGRESS NOTE    [facility] Wichmann  WUJ:[id number] DOB: 1971-10-11 DOA: 02/01/2023 PCP: Fleet ContrasAvbuere, Edwin, MD   Brief Narrative: Mom [facility] [place]au is a 52 y.o. female with a history of asthma, tobacco use, left breast cancer status post lumpectomy/chemotherapy/radiation, anxiety.  Patient presented secondary to shortness of breath and cough with associated wheezing found to have evidence of an asthma exacerbation with associated bacterial bronchitis.  Patient started on empiric antibiotics in addition to supportive care.   Assessment and Plan:  Severe persistent asthma with exacerbation X-ray/CT imaging without evidence of active pneumonia.  Patient without fevers.  Patient started on Solu-Medrol IV and managed with bronchodilators for treatment.  Patient with slow improvement in symptoms and functional capacity.  Respiratory virus panel negative.  COVID-19 test negative.  Solu-Medrol transition to prednisone p.o. -Continue prednisone daily -Continue DuoNeb 3 times daily, albuterol as needed -Continue Breo Ellipta daily  Acute bacterial bronchitis Patient started on doxycycline for management. -Continue doxycycline  Musculoskeletal chest pain Secondary to cough.  Low suspicion for cardiac etiology.  Troponin negative.  Prediabetes Hemoglobin A1c of 5.8%.  Complicated by obesity.  Recommend diet modification to allow for early management to prevent transition to diabetes.  Leukocytosis Likely secondary to steroids.  No obvious evidence of pneumonia at this time.  No fevers.  Nicotine dependence Noted.  Cessation discussed.  Elevated BNP BNP of 1167 on admission.  No obvious evidence of pulmonary edema or other signs of significant fluid overload.  Echocardiogram obtained this admission significant for an LVEF of 65 to 70% with normal diastolic parameters.  Patient was managed empirically with Lasix trial without significant improvement in overall symptoms.  Hypokalemia Managed  with potassium supplementation.   DVT prophylaxis: Lovenox Code Status:   Code Status: Full Code Family Communication: None at bedside Disposition Plan: Discharge home tomorrow if closer to functional baseline   Consultants:  None  Procedures:  4/9: Transthoracic Echocardiogram  Antimicrobials: Doxycycline   Subjective: Patient reports still having some dyspnea/wheezing. Not quite at baseline functional capacity. Hoping to work with physical therapy today.  Objective: BP 108/79 (BP Location: Right Arm)   Pulse 87   Temp 98.1 F (36.7 C) (Oral)   Resp 20   Ht 5\' 2"  (1.575 White)   Wt 95.2 kg   SpO2 93%   BMI 38.39 kg/White   Examination:  General exam: Appears calm and comfortable Respiratory system: Coarse breath sounds without significant wheezing. Respiratory effort normal. Cardiovascular system: S1 & S2 heard, RRR. No murmurs, rubs, gallops or clicks. Gastrointestinal system: Abdomen is nondistended, soft and nontender. No organomegaly or masses felt. Normal bowel sounds heard. Central nervous system: Alert and oriented. No focal neurological deficits. Musculoskeletal: No edema. No calf tenderness Skin: No cyanosis. No rashes Psychiatry: Judgement and insight appear normal. Mood & affect appropriate.    Data Reviewed: I have personally reviewed following labs and imaging studies  CBC Lab Results  Component Value Date   WBC 15.9 (H) 02/05/2023   RBC 5.03 02/05/2023   HGB 14.2 02/05/2023   HCT 43.0 02/05/2023   MCV 85.5 02/05/2023   MCH 28.2 02/05/2023   PLT 346 02/05/2023   MCHC 33.0 02/05/2023   RDW 12.6 02/05/2023   LYMPHSABS 5.8 (H) 02/05/2023   MONOABS 1.2 (H) 02/05/2023   EOSABS 0.1 02/05/2023   BASOSABS 0.1 02/05/2023     Last metabolic panel Lab Results  Component Value Date   NA 136 02/05/2023   K 3.4 (L) 02/05/2023  CL 103 02/05/2023   CO2 24 02/05/2023   BUN 21 (H) 02/05/2023   CREATININE 1.03 (H) 02/05/2023   GLUCOSE 147 (H) 02/05/2023    GFRNONAA >60 02/05/2023   GFRAA 67 04/18/2021   CALCIUM 8.9 02/05/2023   PROT 7.5 02/05/2023   ALBUMIN 3.7 02/05/2023   BILITOT 0.5 02/05/2023   ALKPHOS 100 02/05/2023   AST 16 02/05/2023   ALT 19 02/05/2023   ANIONGAP 9 02/05/2023    GFR: Estimated Creatinine Clearance: 70.2 mL/min (A) (by C-G formula based on SCr of 1.03 mg/dL (H)).  Recent Results (from the past 240 hour(s))  SARS Coronavirus 2 by RT PCR (hospital order, performed in [facility] hospital lab) *cepheid single result test* Anterior Nasal Swab     Status: None   Collection Time: 02/01/23  7:41 PM   Specimen: Anterior Nasal Swab  Result Value Ref Range Status   SARS Coronavirus 2 by RT PCR NEGATIVE NEGATIVE Final    Comment: (NOTE) SARS-CoV-2 target nucleic acids are NOT DETECTED.  The SARS-CoV-2 RNA is generally detectable in upper and lower respiratory specimens during the acute phase of infection. The lowest concentration of SARS-CoV-2 viral copies this assay can detect is 250 copies / mL. A negative result does not preclude SARS-CoV-2 infection and should not be used as the sole basis for treatment or other patient management decisions.  A negative result may occur with improper specimen collection / handling, submission of specimen other than nasopharyngeal swab, presence of viral mutation(s) within the areas targeted by this assay, and inadequate number of viral copies (<250 copies / mL). A negative result must be combined with clinical observations, patient history, and epidemiological information.  Fact Sheet for Patients:   RoadLapTop.co.za  Fact Sheet for Healthcare Providers: http://kim-miller.com/  This test is not yet approved or  cleared by the [place] FDA and has been authorized for detection and/or diagnosis of SARS-CoV-2 by FDA under an Emergency Use Authorization (EUA).  This EUA will remain in effect (meaning this test can be used) for  the duration of the COVID-19 declaration under Section 564(b)(1) of the Act, 21 U.S.C. section 360bbb-3(b)(1), unless the authorization is terminated or revoked sooner.  Performed at [facility], [address], [place], [place] [address]   Respiratory (~20 pathogens) panel by PCR     Status: None   Collection Time: 02/04/23  9:15 AM   Specimen: Nasopharyngeal Swab; Respiratory  Result Value Ref Range Status   Adenovirus NOT DETECTED NOT DETECTED Final   Coronavirus 229E NOT DETECTED NOT DETECTED Final    Comment: (NOTE) The Coronavirus on the Respiratory Panel, DOES NOT test for the novel  Coronavirus (2019 nCoV)    Coronavirus HKU1 NOT DETECTED NOT DETECTED Final   Coronavirus NL63 NOT DETECTED NOT DETECTED Final   Coronavirus OC43 NOT DETECTED NOT DETECTED Final   Metapneumovirus NOT DETECTED NOT DETECTED Final   Rhinovirus / Enterovirus NOT DETECTED NOT DETECTED Final   Influenza A NOT DETECTED NOT DETECTED Final   Influenza B NOT DETECTED NOT DETECTED Final   Parainfluenza Virus 1 NOT DETECTED NOT DETECTED Final   Parainfluenza Virus 2 NOT DETECTED NOT DETECTED Final   Parainfluenza Virus 3 NOT DETECTED NOT DETECTED Final   Parainfluenza Virus 4 NOT DETECTED NOT DETECTED Final   Respiratory Syncytial Virus NOT DETECTED NOT DETECTED Final   Bordetella pertussis NOT DETECTED NOT DETECTED Final   Bordetella Parapertussis NOT DETECTED NOT DETECTED Final   Chlamydophila pneumoniae NOT DETECTED  NOT DETECTED Final   Mycoplasma pneumoniae NOT DETECTED NOT DETECTED Final    Comment: Performed at [facility], [address], [place], [place] [address]      Radiology Studies: CT Angio Chest Pulmonary Embolism (PE) W or WO Contrast  Result Date: 02/04/2023 CLINICAL DATA:  Suspected pulmonary embolism. EXAM: CT ANGIOGRAPHY CHEST WITH CONTRAST TECHNIQUE: Multidetector CT imaging of the chest was performed using the standard protocol during bolus  administration of intravenous contrast. Multiplanar CT image reconstructions and MIPs were obtained to evaluate the vascular anatomy. RADIATION DOSE REDUCTION: This exam was performed according to the departmental dose-optimization program which includes automated exposure control, adjustment of the mA and/or kV according to patient size and/or use of iterative reconstruction technique. CONTRAST:  33mL OMNIPAQUE IOHEXOL 350 MG/ML SOLN COMPARISON:  None Available. FINDINGS: Cardiovascular: The thoracic aorta is normal in appearance. The segmental and subsegmental branches of the bilateral pulmonary arteries are limited in evaluation secondary to suboptimal opacification with intravenous contrast. No evidence of pulmonary embolism. Normal heart size. No pericardial effusion. Mediastinum/Nodes: No enlarged mediastinal, hilar, or axillary lymph nodes. Thyroid gland, trachea, and esophagus demonstrate no significant findings. Lungs/Pleura: Mild anterior bilateral upper lobe atelectasis and/or early infiltrate is seen, left greater than right. Mild bilateral lower lobe linear atelectasis is also noted. There is no evidence of a pleural effusion or pneumothorax. Upper Abdomen: No acute abnormality. Musculoskeletal: Multiple surgical clips are seen within the upper inner quadrant of the left breast. No acute osseous abnormalities are identified. Review of the MIP images confirms the above findings. IMPRESSION: 1. No evidence of pulmonary embolism. 2. Mild anterior bilateral upper lobe atelectasis and/or early infiltrate, left greater than right. 3. Mild bilateral lower lobe linear atelectasis. Electronically Signed   By: Aram Candela White.D.   On: 02/04/2023 21:53   ECHOCARDIOGRAM COMPLETE  Result Date: 02/04/2023    ECHOCARDIOGRAM REPORT   Patient Name:   TANNER ROTHMAN Date of Exam: 02/04/2023 Medical Rec #:  [id number]        Height:       62.0 in Accession #:    [id number]       Weight:       209.9 lb Date of Birth:   05-28-1971       BSA:          1.951 White Patient Age:    51 years         BP:           114/63 mmHg Patient Gender: F                HR:           82 bpm. Exam Location:  Inpatient Procedure: 2D Echo, Color Doppler and Cardiac Doppler Indications:    CHF  History:        Patient has prior history of Echocardiogram examinations and                 Patient has no prior history of Echocardiogram examinations.                 Signs/Symptoms:Dyspnea; Risk Factors:Current Smoker. Breast                 cancer, severe obesity, asthma.  Sonographer:    Milbert Coulter Referring Phys: [id number] Marinda Elk  Sonographer Comments: Patient is obese. Image acquisition challenging due to respiratory motion and asthma exacerbation. IMPRESSIONS  1. Left ventricular ejection fraction, by estimation,  is 65 to 70%. The left ventricle has normal function. Left ventricular endocardial border not optimally defined to evaluate regional wall motion. Left ventricular diastolic parameters were normal.  2. Right ventricular systolic function is normal. The right ventricular size is normal. Tricuspid regurgitation signal is inadequate for assessing PA pressure.  3. The mitral valve is grossly normal. Trivial mitral valve regurgitation. No evidence of mitral stenosis.  4. The aortic valve was not well visualized. Aortic valve regurgitation is not visualized. No aortic stenosis is present.  5. The inferior vena cava is normal in size with greater than 50% respiratory variability, suggesting right atrial pressure of 3 mmHg. FINDINGS  Left Ventricle: Left ventricular ejection fraction, by estimation, is 65 to 70%. The left ventricle has normal function. Left ventricular endocardial border not optimally defined to evaluate regional wall motion. The left ventricular internal cavity size was normal in size. There is no left ventricular hypertrophy. Left ventricular diastolic parameters were normal. Right Ventricle: The right ventricular size is  normal. No increase in right ventricular wall thickness. Right ventricular systolic function is normal. Tricuspid regurgitation signal is inadequate for assessing PA pressure. Left Atrium: Left atrial size was normal in size. Right Atrium: Right atrial size was normal in size. Pericardium: There is no evidence of pericardial effusion. Mitral Valve: The mitral valve is grossly normal. Trivial mitral valve regurgitation. No evidence of mitral valve stenosis. Tricuspid Valve: The tricuspid valve is normal in structure. Tricuspid valve regurgitation is not demonstrated. Aortic Valve: The aortic valve was not well visualized. Aortic valve regurgitation is not visualized. No aortic stenosis is present. Pulmonic Valve: The pulmonic valve was not well visualized. Pulmonic valve regurgitation is not visualized. Aorta: The ascending aorta was not well visualized and the aortic root is normal in size and structure. Venous: The inferior vena cava is normal in size with greater than 50% respiratory variability, suggesting right atrial pressure of 3 mmHg. IAS/Shunts: The interatrial septum was not well visualized.  LEFT VENTRICLE PLAX 2D LVIDd:         4.00 cm     Diastology LVIDs:         2.20 cm     LV e' medial:    7.72 cm/s LV PW:         0.90 cm     LV E/e' medial:  13.2 LV IVS:        1.00 cm     LV e' lateral:   14.65 cm/s                            LV E/e' lateral: 7.0  LV Volumes (MOD) LV vol d, MOD A2C: 27.1 ml LV vol d, MOD A4C: 39.2 ml LV vol s, MOD A2C: 9.0 ml LV vol s, MOD A4C: 10.7 ml LV SV MOD A2C:     18.2 ml LV SV MOD A4C:     39.2 ml LV SV MOD BP:      22.6 ml RIGHT VENTRICLE RV S prime:     19.60 cm/s TAPSE (White-mode): 2.0 cm LEFT ATRIUM             Index LA diam:        3.00 cm 1.54 cm/White LA Vol (A2C):   30.0 ml 15.37 ml/White LA Vol (A4C):   34.8 ml 17.83 ml/White LA Biplane Vol: 32.9 ml 16.86 ml/White  MITRAL VALVE MV Area (PHT): 3.46 cm MV Decel Time: 219 msec  MV E velocity: 102.00 cm/s MV A velocity: 95.10 cm/s MV  E/A ratio:  1.07 Weston Brass MD Electronically signed by Weston Brass MD Signature Date/Time: 02/04/2023/9:28:06 AM    Final       LOS: 4 days    Jacquelin Hawking, MD [facility] 02/05/2023, 10:01 AM   If 7PM-7AM, please contact night-coverage [email or web address]

## 2023-02-05 NOTE — Plan of Care (Signed)
?  Problem: Education: ?Goal: Knowledge of General Education information will improve ?Description: Including pain rating scale, medication(s)/side effects and non-pharmacologic comfort measures ?Outcome: Progressing ?  ?Problem: Health Behavior/Discharge Planning: ?Goal: Ability to manage health-related needs will improve ?Outcome: Progressing ?  ?Problem: Clinical Measurements: ?Goal: Ability to maintain clinical measurements within normal limits will improve ?Outcome: Progressing ?Goal: Will remain free from infection ?Outcome: Progressing ?Goal: Diagnostic test results will improve ?Outcome: Progressing ?Goal: Cardiovascular complication will be avoided ?Outcome: Progressing ?  ?Problem: Nutrition: ?Goal: Adequate nutrition will be maintained ?Outcome: Progressing ?  ?

## 2023-02-05 NOTE — Care Management Important Message (Signed)
Important Message  Patient Details IM Letter given to the Patient. Name: Yolanda White MRN: [id number] Date of Birth: 02/18/1971   Medicare Important Message Given:  Yes     Caren Macadam 02/05/2023, 10:39 AM

## 2023-02-05 NOTE — TOC Progression Note (Signed)
Transition of Care [facility]) - Progression Note    Patient Details  Name: DENEA GRAFTON MRN: [id number] Date of Birth: June 13, 1971  Transition [facility] CM/SW Contact  Howell Rucks, RN Phone Number: 02/05/2023, 10:21 AM  Clinical Narrative:  Met with pt in room per pt's request. Pt states dtr recently lost job, reports difficulty paying rent. Provided with resources-DSS, 211. Already has home health and DME set up for safe dc home. No further TOC needs identified.      Expected Discharge Plan: Home w Home Health Services Barriers to Discharge: Continued Medical Work up  Expected Discharge Plan and Services   Discharge Planning Services: CM Consult   Living arrangements for the past 2 months: Single Family Home                 DME Arranged: Walker rolling with seat DME Agency: [employer] Date DME Agency Contacted: 02/04/23 Time DME Agency Contacted: 1436 Representative spoke with at DME Agency: Vaughan Basta HH Arranged: PT, OT HH Agency: [facility] Date [facility] Agency Contacted: 02/04/23 Time HH Agency Contacted: 1437 Representative spoke with at [facility] Agency: Hassel Neth   Social Determinants of Health (SDOH) Interventions SDOH Screenings   Food Insecurity: No Food Insecurity (02/01/2023)  Housing: Low Risk  (02/01/2023)  Transportation Needs: No Transportation Needs (02/01/2023)  Utilities: Not At Risk (02/01/2023)  Tobacco Use: High Risk (02/01/2023)    Readmission Risk Interventions     No data to display

## 2023-02-05 NOTE — Progress Notes (Signed)
Physical Therapy Treatment Patient Details Name: AVANNAH GUERRIERO MRN: [id number] DOB: 05/06/1971 Today's Date: 02/05/2023   History of Present Illness Patient is 52 y.o. female presented to [facility] ED with c/o SOB, cough, and wheezing. Upon evaluation in the emergency department patient received intravenous Solu-Medrol and nebulized bronchodilators.  Patient was clinically felt to be suffering from an acute asthma exacerbation. PMH significant of asthma, former tobacco abuse, history of left breast cancer, anxiety disorder, history of previous radiation therapy and seasonal allergies.    PT Comments    Assisted pt out of recliner to ambulate in hallway with rollator. Pt reported of "feeling lightheaded", took standing BP 121/68 after 50ft, 119/68 after another 72ft. Assisted pt back to recliner. Educated on rollator with VCs for brakes.     Recommendations for follow up therapy are one component of a multi-disciplinary discharge planning process, led by the attending physician.  Recommendations may be updated based on patient status, additional functional criteria and insurance authorization.  Follow Up Recommendations       Assistance Recommended at Discharge Intermittent Supervision/Assistance  Patient can return home with the following A little help with walking and/or transfers;A little help with bathing/dressing/bathroom;Assistance with cooking/housework;Assist for transportation;Help with stairs or ramp for entrance   Equipment Recommendations  Rollator (4 wheels)    Recommendations for Other Services       Precautions / Restrictions Precautions Precautions: Fall Precaution Comments: shortness of breath Restrictions Weight Bearing Restrictions: No     Mobility  Bed Mobility                    Transfers Overall transfer level: Needs assistance Equipment used: Rolling walker (2 wheels) Transfers: Sit to/from Stand Sit to Stand: Min assist           General  transfer comment: pt using hands for power up from recliner. min assist to steady on rise.    Ambulation/Gait Ambulation/Gait assistance: Min assist Gait Distance (Feet): 40 Feet Assistive device: Rolling walker (2 wheels) Gait Pattern/deviations: Step-through pattern, Decreased step length - right, Decreased step length - left, Decreased stride length, Shuffle, Trunk flexed Gait velocity: decr     General Gait Details: . Pt reaching out for support with other UE. RW provided for ambulation in hallway, pt slow and flexed psosture with shuffled steps.   Stairs             Wheelchair Mobility    Modified Rankin (Stroke Patients Only)       Balance                                            Cognition Arousal/Alertness: Awake/alert Behavior During Therapy: WFL for tasks assessed/performed, Anxious Overall Cognitive Status: Within Functional Limits for tasks assessed                                          Exercises      General Comments        Pertinent Vitals/Pain Pain Assessment Pain Score: 0-No pain Pain Location: chest pain related to coughing, no prain as pt reported given pain medication prior to session Pain Descriptors / Indicators: Discomfort Pain Intervention(s): Monitored during session, Repositioned, Premedicated before session    Home Living  Prior Function            PT Goals (current goals can now be found in the care plan section) Acute Rehab PT Goals Patient Stated Goal: get better and home PT Goal Formulation: With patient Time For Goal Achievement: 02/17/23 Potential to Achieve Goals: Good Progress towards PT goals: Progressing toward goals    Frequency    Min 1X/week      PT Plan Current plan remains appropriate    Co-evaluation              AM-PAC PT "6 Clicks" Mobility   Outcome Measure  Help needed turning from your back to your side while  in a flat bed without using bedrails?: A Little Help needed moving from lying on your back to sitting on the side of a flat bed without using bedrails?: A Little Help needed moving to and from a bed to a chair (including a wheelchair)?: A Little Help needed standing up from a chair using your arms (e.g., wheelchair or bedside chair)?: A Little Help needed to walk in hospital room?: A Little Help needed climbing 3-5 steps with a railing? : A Lot 6 Click Score: 17    End of Session Equipment Utilized During Treatment: Gait belt Activity Tolerance: Patient tolerated treatment well;Patient limited by fatigue Patient left: with call bell/phone within reach;in chair;with chair alarm set Nurse Communication: Mobility status PT Visit Diagnosis: Unsteadiness on feet (R26.81);Muscle weakness (generalized) (M62.81);Difficulty in walking, not elsewhere classified (R26.2)     Time: 8270-7867 PT Time Calculation (min) (ACUTE ONLY): 24 min  Charges:  $Gait Training: 8-22 mins $Therapeutic Activity: 8-22 mins                    Sharlene Motts, [place]

## 2023-02-05 NOTE — Assessment & Plan Note (Signed)
·   Replacing with potassium chloride °· Evaluating for concurrent hypomagnesemia  °· Monitoring potassium levels with serial chemistries. ° °

## 2023-02-05 NOTE — Assessment & Plan Note (Signed)
Notably elevated BNP on 4/8 of 1167 However echocardiogram obtained 4/9 revealing preserved ejection fraction of 65 to 70% without evidence of diastolic dysfunction. Patient was given several trial doses of Lasix however this has since been discontinued.

## 2023-02-06 ENCOUNTER — Inpatient Hospital Stay (HOSPITAL_COMMUNITY): Payer: Medicare Other

## 2023-02-06 DIAGNOSIS — J4551 Severe persistent asthma with (acute) exacerbation: Secondary | ICD-10-CM | POA: Diagnosis not present

## 2023-02-06 LAB — POTASSIUM: Potassium: 3.6 mmol/L (ref 3.5–5.1)

## 2023-02-06 MED ORDER — DOXYCYCLINE HYCLATE 100 MG PO TABS: 100.0000 mg | ORAL_TABLET | Freq: Two times a day (BID) | ORAL | 0 refills | Status: AC

## 2023-02-06 MED ORDER — SORBITOL 70 % SOLN
960.0000 mL | TOPICAL_OIL | Freq: Once | ORAL | Status: AC
  Administered 2023-02-06: 960 mL via RECTAL
  Filled 2023-02-06: qty 240

## 2023-02-06 MED ORDER — ALBUTEROL SULFATE (2.5 MG/3ML) 0.083% IN NEBU: 2.5000 mg | INHALATION_SOLUTION | RESPIRATORY_TRACT | 0 refills | Status: AC | PRN

## 2023-02-06 MED ORDER — POLYETHYLENE GLYCOL 3350 17 G PO PACK: 17.0000 g | PACK | Freq: Two times a day (BID) | ORAL | 0 refills | Status: AC

## 2023-02-06 MED ORDER — ALBUTEROL SULFATE HFA 108 (90 BASE) MCG/ACT IN AERS: 2.0000 | INHALATION_SPRAY | Freq: Four times a day (QID) | RESPIRATORY_TRACT | 1 refills | Status: DC | PRN

## 2023-02-06 MED ORDER — MONTELUKAST SODIUM 10 MG PO TABS: 10.0000 mg | ORAL_TABLET | Freq: Every day | ORAL | 0 refills | Status: DC

## 2023-02-06 NOTE — TOC Transition Note (Signed)
Transition of Care [facility]) - CM/SW Discharge Note   Patient Details  Name: Yolanda White MRN: [id number] Date of Birth: 1971/08/02  [facility] CM/SW Contact:  Lanier Clam, RN Phone Number: 02/06/2023, 3:16 PM   Clinical Narrative: d/c home. [facility]/OT;[employer] home 02-delivered rollator to rm already. No further CM needs.      Final next level of care: Home w Home Health Services Barriers to Discharge: No Barriers Identified   Patient Goals and CMS Choice CMS Medicare.gov Compare Post Acute Care list provided to:: Patient Choice offered to / list presented to : Patient  Discharge Placement                         Discharge Plan and Services Additional resources added to the After Visit Summary for     Discharge Planning Services: CM Consult            DME Arranged: Dan Humphreys rolling with seat DME Agency: [employer] Date DME Agency Contacted: 02/04/23 Time DME Agency Contacted: 1436 Representative spoke with at DME Agency: Vaughan Basta HH Arranged: PT, OT HH Agency: [facility] Date [facility] Agency Contacted: 02/04/23 Time HH Agency Contacted: 1437 Representative spoke with at [facility] Agency: [place]el [place]  Social Determinants of Health (SDOH) Interventions SDOH Screenings   Food Insecurity: No Food Insecurity (02/01/2023)  Housing: Low Risk  (02/01/2023)  Transportation Needs: No Transportation Needs (02/01/2023)  Utilities: Not At Risk (02/01/2023)  Tobacco Use: High Risk (02/01/2023)     Readmission Risk Interventions     No data to display

## 2023-02-06 NOTE — Discharge Summary (Signed)
Physician Discharge Summary   Patient: Yolanda White MRN: [id number] DOB: 09-12-1971  Admit date:     02/01/2023  Discharge date: 02/06/23  Discharge Physician: Jacquelin Hawking, MD   PCP: Fleet Contras, MD   Recommendations at discharge:  Follow-up with PCP in week  Discharge Diagnoses: Principal Problem:   Severe persistent asthma with (acute) exacerbation Active Problems:   Acute bacterial bronchitis   Musculoskeletal chest pain   Prediabetes   Leukocytosis   Nicotine dependence, cigarettes, uncomplicated   Elevated brain natriuretic peptide (BNP) level   Hypokalemia   Breast cancer of upper-inner quadrant of left female breast   Severe obesity (BMI >= 40)   Depression  Resolved Problems:   * No resolved hospital problems. *  Hospital Course: Yolanda White is a 52 y.o. female with a history of asthma, tobacco use, left breast cancer status post lumpectomy/chemotherapy/radiation, anxiety.  Patient presented secondary to shortness of breath and cough with associated wheezing found to have evidence of an asthma exacerbation with associated bacterial bronchitis.  Patient started on empiric antibiotics in addition to supportive care.  Assessment and Plan:  Severe persistent asthma with exacerbation X-ray/CT imaging without evidence of active pneumonia.  Patient without fevers.  Patient started on Solu-Medrol IV and managed with bronchodilators for treatment.  Patient with slow improvement in symptoms and functional capacity.  Respiratory virus panel negative.  COVID-19 test negative.  Solu-Medrol transition to prednisone p.o. and completed course. Discharge on home Incruse Ellipta and albuterol. Discharge with Singulair. Follow-up with PCP.    Acute bacterial bronchitis Patient started on doxycycline for management. Continue doxycycline to complete a 5 day course.   Musculoskeletal chest pain Secondary to cough.  Low suspicion for cardiac etiology.  Troponin  negative.  Prediabetes Hemoglobin A1c of 5.8%.  Complicated by obesity.  Recommend diet modification to allow for early management to prevent transition to diabetes.   Leukocytosis Likely secondary to steroids.  No obvious evidence of pneumonia at this time.  No fevers.   Nicotine dependence Noted.  Cessation discussed.   Elevated BNP BNP of 1167 on admission.  No obvious evidence of pulmonary edema or other signs of significant fluid overload.  Echocardiogram obtained this admission significant for an LVEF of 65 to 70% with normal diastolic parameters.  Patient was managed empirically with Lasix trial without significant improvement in overall symptoms.   Hypokalemia Managed with potassium supplementation.  Constipation Patient received an enema with resultant large bowel movement. Continue bowel regimen on discharge.   Consultants: None Procedures performed: None  Disposition: Home Diet recommendation: Carb modified diet   DISCHARGE MEDICATION: Allergies as of 02/06/2023   No Known Allergies      Medication List     STOP taking these medications    celecoxib 200 MG capsule Commonly known as: CELEBREX       TAKE these medications    albuterol 108 (90 Base) MCG/ACT inhaler Commonly known as: VENTOLIN HFA Inhale 2 puffs into the lungs every 6 (six) hours as needed for wheezing or shortness of breath. Use either inhaler OR nebulizer but not both together. What changed: additional instructions   albuterol (2.5 MG/3ML) 0.083% nebulizer solution Commonly known as: PROVENTIL Take 3 mLs (2.5 mg total) by nebulization every 4 (four) hours as needed for shortness of breath. Use either inhaler OR nebulizer but not both together. What changed: You were already taking a medication with the same name, and this prescription was added. Make sure you understand how and  when to take each.   azelastine 0.1 % nasal spray Commonly known as: ASTELIN Place into both nostrils 2  (two) times daily. Use in each nostril as directed   cetirizine 10 MG tablet Commonly known as: ZYRTEC Take 10 mg by mouth daily.   doxycycline 100 MG tablet Commonly known as: VIBRA-TABS Take 1 tablet (100 mg total) by mouth every 12 (twelve) hours for 2 days.   DULoxetine 30 MG capsule Commonly known as: CYMBALTA Take 30 mg by mouth every morning.   Incruse Ellipta 62.5 MCG/ACT Aepb Generic drug: umeclidinium bromide Inhale 1 puff into the lungs daily.   montelukast 10 MG tablet Commonly known as: SINGULAIR Take 1 tablet (10 mg total) by mouth at bedtime.   nabumetone 750 MG tablet Commonly known as: RELAFEN Take 750 mg by mouth 2 (two) times daily.   ondansetron 4 MG disintegrating tablet Commonly known as: ZOFRAN-ODT Take 4 mg by mouth every 8 (eight) hours as needed for nausea or vomiting.   polyethylene glycol 17 g packet Commonly known as: MIRALAX / GLYCOLAX Take 17 g by mouth 2 (two) times daily. Decrease to daily if having watery stools.   tiZANidine 4 MG tablet Commonly known as: ZANAFLEX Take 4 mg by mouth 3 (three) times daily as needed for muscle spasms.   Vitamin D (Ergocalciferol) 1.25 MG (50000 UNIT) Caps capsule Commonly known as: DRISDOL Take 50,000 Units by mouth once a week.               Durable Medical Equipment  (From admission, onward)           Start     Ordered   02/04/23 1430  For home use only DME 4 wheeled rolling walker with seat  Once       Question:  Patient needs a walker to treat with the following condition  Answer:  Unsteady gait   02/04/23 1430            Follow-up Information     [facility] Follow up.   Specialty: Home Health Services Why: [facility] Physical [facility], Occupational Therapy Contact information: [address] [place] [address] [phone]         [facility] Follow up.   Why: Technical sales engineer information: [address] [place], [place] [address]  Phone: [phone]                Discharge Exam: BP (!) 120/90 (BP Location: Right Arm)   Pulse 71   Temp 98.1 F (36.7 C) (Oral)   Resp 20   Ht 5\' 2"  (1.575 m)   Wt 95.2 kg   SpO2 99%   BMI 38.39 kg/m   General exam: Appears calm and comfortable Respiratory system: Clear to auscultation. Respiratory effort normal. Cardiovascular system: S1 & S2 heard, RRR. No murmurs, rubs, gallops or clicks. Gastrointestinal system: Abdomen is nondistended, soft and nontender. Normal bowel sounds heard. Central nervous system: Alert and oriented. No focal neurological deficits. Musculoskeletal: No edema. No calf tenderness Skin: No cyanosis. No rashes Psychiatry: Judgement and insight appear normal. Mood & affect appropriate.   Condition at discharge: stable  The results of significant diagnostics from this hospitalization (including imaging, microbiology, ancillary and laboratory) are listed below for reference.   Imaging Studies: DG Abd Portable 1V  Result Date: 02/06/2023 CLINICAL DATA:  Constipation EXAM: PORTABLE ABDOMEN - 1 VIEW COMPARISON:  Radiograph done on 02/08/2016, CT done on 11/24/2019 FINDINGS: Bowel gas pattern is nonspecific. Moderate  to large amount of stool is seen in: And rectum. No abnormal masses or calcifications are seen. Small linear densities in right lower lung field may suggest scarring or subsegmental atelectasis. IMPRESSION: Nonspecific bowel gas pattern. Moderate to large amount of stool is seen in colon and rectum. Electronically Signed   By: Ernie AvenaPalani  Rathinasamy M.D.   On: 02/06/2023 12:31   CT Angio Chest Pulmonary Embolism (PE) W or WO Contrast  Result Date: 02/04/2023 CLINICAL DATA:  Suspected pulmonary embolism. EXAM: CT ANGIOGRAPHY CHEST WITH CONTRAST TECHNIQUE: Multidetector CT imaging of the chest was performed using the standard protocol during bolus administration of intravenous contrast. Multiplanar CT image reconstructions and MIPs were obtained to evaluate the vascular  anatomy. RADIATION DOSE REDUCTION: This exam was performed according to the departmental dose-optimization program which includes automated exposure control, adjustment of the mA and/or kV according to patient size and/or use of iterative reconstruction technique. CONTRAST:  75mL OMNIPAQUE IOHEXOL 350 MG/ML SOLN COMPARISON:  None Available. FINDINGS: Cardiovascular: The thoracic aorta is normal in appearance. The segmental and subsegmental branches of the bilateral pulmonary arteries are limited in evaluation secondary to suboptimal opacification with intravenous contrast. No evidence of pulmonary embolism. Normal heart size. No pericardial effusion. Mediastinum/Nodes: No enlarged mediastinal, hilar, or axillary lymph nodes. Thyroid gland, trachea, and esophagus demonstrate no significant findings. Lungs/Pleura: Mild anterior bilateral upper lobe atelectasis and/or early infiltrate is seen, left greater than right. Mild bilateral lower lobe linear atelectasis is also noted. There is no evidence of a pleural effusion or pneumothorax. Upper Abdomen: No acute abnormality. Musculoskeletal: Multiple surgical clips are seen within the upper inner quadrant of the left breast. No acute osseous abnormalities are identified. Review of the MIP images confirms the above findings. IMPRESSION: 1. No evidence of pulmonary embolism. 2. Mild anterior bilateral upper lobe atelectasis and/or early infiltrate, left greater than right. 3. Mild bilateral lower lobe linear atelectasis. Electronically Signed   By: Aram Candelahaddeus  Houston M.D.   On: 02/04/2023 21:53   ECHOCARDIOGRAM COMPLETE  Result Date: 02/04/2023    ECHOCARDIOGRAM REPORT   Patient Name:   Yolanda White Date of Exam: 02/04/2023 Medical Rec #:  [id number]        Height:       62.0 in Accession #:    [id number]       Weight:       209.9 lb Date of Birth:  09/26/1971       BSA:          1.951 m Patient Age:    51 years         BP:           114/63 mmHg Patient Gender: F                 HR:           82 bpm. Exam Location:  Inpatient Procedure: 2D Echo, Color Doppler and Cardiac Doppler Indications:    CHF  History:        Patient has prior history of Echocardiogram examinations and                 Patient has no prior history of Echocardiogram examinations.                 Signs/Symptoms:Dyspnea; Risk Factors:Current Smoker. Breast                 cancer, severe obesity, asthma.  Sonographer:    Milbert CoulterMelissa Kafa Referring Phys: [phone]  Deno Lunger SHALHOUB  Sonographer Comments: Patient is obese. Image acquisition challenging due to respiratory motion and asthma exacerbation. IMPRESSIONS  1. Left ventricular ejection fraction, by estimation, is 65 to 70%. The left ventricle has normal function. Left ventricular endocardial border not optimally defined to evaluate regional wall motion. Left ventricular diastolic parameters were normal.  2. Right ventricular systolic function is normal. The right ventricular size is normal. Tricuspid regurgitation signal is inadequate for assessing PA pressure.  3. The mitral valve is grossly normal. Trivial mitral valve regurgitation. No evidence of mitral stenosis.  4. The aortic valve was not well visualized. Aortic valve regurgitation is not visualized. No aortic stenosis is present.  5. The inferior vena cava is normal in size with greater than 50% respiratory variability, suggesting right atrial pressure of 3 mmHg. FINDINGS  Left Ventricle: Left ventricular ejection fraction, by estimation, is 65 to 70%. The left ventricle has normal function. Left ventricular endocardial border not optimally defined to evaluate regional wall motion. The left ventricular internal cavity size was normal in size. There is no left ventricular hypertrophy. Left ventricular diastolic parameters were normal. Right Ventricle: The right ventricular size is normal. No increase in right ventricular wall thickness. Right ventricular systolic function is normal. Tricuspid regurgitation  signal is inadequate for assessing PA pressure. Left Atrium: Left atrial size was normal in size. Right Atrium: Right atrial size was normal in size. Pericardium: There is no evidence of pericardial effusion. Mitral Valve: The mitral valve is grossly normal. Trivial mitral valve regurgitation. No evidence of mitral valve stenosis. Tricuspid Valve: The tricuspid valve is normal in structure. Tricuspid valve regurgitation is not demonstrated. Aortic Valve: The aortic valve was not well visualized. Aortic valve regurgitation is not visualized. No aortic stenosis is present. Pulmonic Valve: The pulmonic valve was not well visualized. Pulmonic valve regurgitation is not visualized. Aorta: The ascending aorta was not well visualized and the aortic root is normal in size and structure. Venous: The inferior vena cava is normal in size with greater than 50% respiratory variability, suggesting right atrial pressure of 3 mmHg. IAS/Shunts: The interatrial septum was not well visualized.  LEFT VENTRICLE PLAX 2D LVIDd:         4.00 cm     Diastology LVIDs:         2.20 cm     LV e' medial:    7.72 cm/s LV PW:         0.90 cm     LV E/e' medial:  13.2 LV IVS:        1.00 cm     LV e' lateral:   14.65 cm/s                            LV E/e' lateral: 7.0  LV Volumes (MOD) LV vol d, MOD A2C: 27.1 ml LV vol d, MOD A4C: 39.2 ml LV vol s, MOD A2C: 9.0 ml LV vol s, MOD A4C: 10.7 ml LV SV MOD A2C:     18.2 ml LV SV MOD A4C:     39.2 ml LV SV MOD BP:      22.6 ml RIGHT VENTRICLE RV S prime:     19.60 cm/s TAPSE (M-mode): 2.0 cm LEFT ATRIUM             Index LA diam:        3.00 cm 1.54 cm/m LA Vol (A2C):   30.0 ml 15.37 ml/m LA  Vol (A4C):   34.8 ml 17.83 ml/m LA Biplane Vol: 32.9 ml 16.86 ml/m  MITRAL VALVE MV Area (PHT): 3.46 cm MV Decel Time: 219 msec MV E velocity: 102.00 cm/s MV A velocity: 95.10 cm/s MV E/A ratio:  1.07 Weston Brass MD Electronically signed by Weston Brass MD Signature Date/Time: 02/04/2023/9:28:06 AM     Final    DG Chest 1 View  Result Date: 02/02/2023 CLINICAL DATA:  10026 Shortness of breath 10026 EXAM: CHEST  1 VIEW COMPARISON:  February 01, 2023, November 18, 2017 FINDINGS: The cardiomediastinal silhouette is unchanged in contour.LEFT-sided clips. No pleural effusion. No pneumothorax. No acute pleuroparenchymal abnormality. IMPRESSION: No acute cardiopulmonary abnormality. Electronically Signed   By: Meda Klinefelter M.D.   On: 02/02/2023 14:05   DG Chest Portable 1 View  Result Date: 02/01/2023 CLINICAL DATA:  Shortness of breath EXAM: PORTABLE CHEST 1 VIEW COMPARISON:  Chest x-ray 11/18/2017 FINDINGS: The heart size and mediastinal contours are within normal limits. Both lungs are clear. The visualized skeletal structures are unremarkable. Surgical clips overlie the left lower chest. IMPRESSION: No active disease. Electronically Signed   By: Darliss Cheney M.D.   On: 02/01/2023 19:36    Microbiology: Results for orders placed or performed during the hospital encounter of 02/01/23  SARS Coronavirus 2 by RT PCR (hospital order, performed in [facility] hospital lab) *cepheid single result test* Anterior Nasal Swab     Status: None   Collection Time: 02/01/23  7:41 PM   Specimen: Anterior Nasal Swab  Result Value Ref Range Status   SARS Coronavirus 2 by RT PCR NEGATIVE NEGATIVE Final    Comment: (NOTE) SARS-CoV-2 target nucleic acids are NOT DETECTED.  The SARS-CoV-2 RNA is generally detectable in upper and lower respiratory specimens during the acute phase of infection. The lowest concentration of SARS-CoV-2 viral copies this assay can detect is 250 copies / mL. A negative result does not preclude SARS-CoV-2 infection and should not be used as the sole basis for treatment or other patient management decisions.  A negative result may occur with improper specimen collection / handling, submission of specimen other than nasopharyngeal swab, presence of viral mutation(s) within the areas  targeted by this assay, and inadequate number of viral copies (<250 copies / mL). A negative result must be combined with clinical observations, patient history, and epidemiological information.  Fact Sheet for Patients:   RoadLapTop.co.za  Fact Sheet for Healthcare Providers: http://kim-miller.com/  This test is not yet approved or  cleared by the [place] FDA and has been authorized for detection and/or diagnosis of SARS-CoV-2 by FDA under an Emergency Use Authorization (EUA).  This EUA will remain in effect (meaning this test can be used) for the duration of the COVID-19 declaration under Section 564(b)(1) of the Act, 21 U.S.C. section 360bbb-3(b)(1), unless the authorization is terminated or revoked sooner.  Performed at [facility], [address], [place], [place] [address]   Respiratory (~20 pathogens) panel by PCR     Status: None   Collection Time: 02/04/23  9:15 AM   Specimen: Nasopharyngeal Swab; Respiratory  Result Value Ref Range Status   Adenovirus NOT DETECTED NOT DETECTED Final   Coronavirus 229E NOT DETECTED NOT DETECTED Final    Comment: (NOTE) The Coronavirus on the Respiratory Panel, DOES NOT test for the novel  Coronavirus (2019 nCoV)    Coronavirus HKU1 NOT DETECTED NOT DETECTED Final   Coronavirus NL63 NOT DETECTED NOT DETECTED Final   Coronavirus OC43 NOT  DETECTED NOT DETECTED Final   Metapneumovirus NOT DETECTED NOT DETECTED Final   Rhinovirus / Enterovirus NOT DETECTED NOT DETECTED Final   Influenza A NOT DETECTED NOT DETECTED Final   Influenza B NOT DETECTED NOT DETECTED Final   Parainfluenza Virus 1 NOT DETECTED NOT DETECTED Final   Parainfluenza Virus 2 NOT DETECTED NOT DETECTED Final   Parainfluenza Virus 3 NOT DETECTED NOT DETECTED Final   Parainfluenza Virus 4 NOT DETECTED NOT DETECTED Final   Respiratory Syncytial Virus NOT DETECTED NOT DETECTED Final   Bordetella pertussis  NOT DETECTED NOT DETECTED Final   Bordetella Parapertussis NOT DETECTED NOT DETECTED Final   Chlamydophila pneumoniae NOT DETECTED NOT DETECTED Final   Mycoplasma pneumoniae NOT DETECTED NOT DETECTED Final    Comment: Performed at [facility], [address] [place], [place] [address]    Labs: CBC: Recent Labs  Lab 02/01/23 1905 02/01/23 2153 02/02/23 0503 02/03/23 0940 02/04/23 0523 02/05/23 0503  WBC 15.1* 13.2* 8.0 14.9* 13.7* 15.9*  NEUTROABS 8.6*  --   --  12.8* 8.8* 8.7*  HGB 14.6 14.6 13.2 13.6 12.5 14.2  HCT 44.1 42.9 39.8 42.3 38.4 43.0  MCV 84.5 83.3 85.2 88.5 86.3 85.5  PLT 383 332 300 331 271 346   Basic Metabolic Panel: Recent Labs  Lab 02/01/23 1905 02/01/23 2153 02/02/23 0503 02/03/23 0940 02/04/23 0523 02/05/23 0503 02/06/23 0849  NA 136  --  133* 136 135 136  --   K 3.5  --  3.7 4.7 3.3* 3.4* 3.6  CL 103  --  105 104 103 103  --   CO2 20*  --  20* 23 24 24   --   GLUCOSE 125*  --  196* 210* 229* 147*  --   BUN 12  --  15 17 21* 21*  --   CREATININE 1.22* 1.28* 1.29* 1.05* 1.11* 1.03*  --   CALCIUM 9.2  --  8.6* 9.3 8.4* 8.9  --   MG  --   --   --  2.3 1.9 2.0  --    Liver Function Tests: Recent Labs  Lab 02/02/23 0503 02/03/23 0940 02/04/23 0523 02/05/23 0503  AST 22 15 12* 16  ALT 18 14 14 19   ALKPHOS 112 113 88 100  BILITOT 0.6 0.4 0.4 0.5  PROT 7.4 8.4* 6.6 7.5  ALBUMIN 3.6 4.1 3.3* 3.7    Discharge time spent:  35 minutes.  Signed: Jacquelin Hawking, MD [facility] 02/06/2023

## 2023-02-06 NOTE — Discharge Instructions (Addendum)
Yolanda White,  You were in the hospital with an asthma exacerbation. You have been treated with antibiotics, steroids and bronchodilators with improvement of symptoms. Please continue your antibiotics and bronchodilators. Please follow-up with your PCP. You also showed signs of pre-diabetes. Please follow-up with your physician about this to prevent development of diabetes.

## 2023-02-06 NOTE — Progress Notes (Signed)
Mobility Specialist - Progress Note   02/06/23 1256  Mobility  Activity Ambulated with assistance in hallway  Level of Assistance Standby assist, set-up cues, supervision of patient - no hands on  Assistive Device Four wheel walker  Distance Ambulated (ft) 100 ft  Activity Response Tolerated well  Mobility Referral Yes  $Mobility charge 1 Mobility   Pt received in bed and agreed to mobility, had no issues with session. Pt returned to bed with all needs met.   Marilynne Halsted Mobility Specialist

## 2023-03-06 ENCOUNTER — Other Ambulatory Visit: Payer: Self-pay | Admitting: Internal Medicine

## 2023-03-07 LAB — CBC
HCT: 38.9 % (ref 35.0–45.0)
Hemoglobin: 12.9 g/dL (ref 11.7–15.5)
MCH: 27.9 pg (ref 27.0–33.0)
MCHC: 33.2 g/dL (ref 32.0–36.0)
MCV: 84.2 fL (ref 80.0–100.0)
MPV: 10.6 fL (ref 7.5–12.5)
Platelets: 284 10*3/uL (ref 140–400)
RBC: 4.62 10*6/uL (ref 3.80–5.10)
RDW: 13 % (ref 11.0–15.0)
WBC: 9.1 10*3/uL (ref 3.8–10.8)

## 2023-03-07 LAB — COMPLETE METABOLIC PANEL WITH GFR
AG Ratio: 1.5 (calc) (ref 1.0–2.5)
ALT: 19 U/L (ref 6–29)
AST: 18 U/L (ref 10–35)
Albumin: 4.2 g/dL (ref 3.6–5.1)
Alkaline phosphatase (APISO): 113 U/L (ref 37–153)
BUN: 19 mg/dL (ref 7–25)
CO2: 22 mmol/L (ref 20–32)
Calcium: 9.4 mg/dL (ref 8.6–10.4)
Chloride: 106 mmol/L (ref 98–110)
Creat: 1.02 mg/dL (ref 0.50–1.03)
Globulin: 2.8 g/dL (calc) (ref 1.9–3.7)
Glucose, Bld: 129 mg/dL — ABNORMAL HIGH (ref 65–99)
Potassium: 4.7 mmol/L (ref 3.5–5.3)
Sodium: 139 mmol/L (ref 135–146)
Total Bilirubin: 0.5 mg/dL (ref 0.2–1.2)
Total Protein: 7 g/dL (ref 6.1–8.1)
eGFR: 67 mL/min/{1.73_m2} (ref >=60)

## 2023-03-07 LAB — VITAMIN D 25 HYDROXY (VIT D DEFICIENCY, FRACTURES): Vit D, 25-Hydroxy: 39 ng/mL (ref 30–100)

## 2023-03-07 LAB — LIPID PANEL
Cholesterol: 230 mg/dL — ABNORMAL HIGH (ref <200)
HDL: 58 mg/dL (ref >=50)
LDL Cholesterol (Calc): 145 mg/dL (calc) — ABNORMAL HIGH
Non-HDL Cholesterol (Calc): 172 mg/dL (calc) — ABNORMAL HIGH (ref <130)
Total CHOL/HDL Ratio: 4 (calc) (ref <5.0)
Triglycerides: 138 mg/dL (ref <150)

## 2023-03-07 LAB — TSH: TSH: 2.94 mIU/L

## 2023-04-09 ENCOUNTER — Ambulatory Visit (INDEPENDENT_AMBULATORY_CARE_PROVIDER_SITE_OTHER): Payer: 59 | Admitting: Internal Medicine

## 2023-04-09 ENCOUNTER — Encounter: Payer: Self-pay | Admitting: Internal Medicine

## 2023-04-09 VITALS — BP 118/82 | HR 78 | Temp 97.0°F | Ht 62.0 in | Wt 249.0 lb

## 2023-04-09 DIAGNOSIS — J4489 Other specified chronic obstructive pulmonary disease: Secondary | ICD-10-CM

## 2023-04-09 MED ORDER — MONTELUKAST SODIUM 10 MG PO TABS: 10.0000 mg | ORAL_TABLET | Freq: Every day | ORAL | 11 refills | Status: AC

## 2023-04-09 MED ORDER — BREZTRI AEROSPHERE 160-9-4.8 MCG/ACT IN AERO: 2.0000 | INHALATION_SPRAY | Freq: Two times a day (BID) | RESPIRATORY_TRACT | 5 refills | Status: DC

## 2023-04-09 MED ORDER — LEVOCETIRIZINE DIHYDROCHLORIDE 5 MG PO TABS: 5.0000 mg | ORAL_TABLET | Freq: Every evening | ORAL | 11 refills | Status: AC

## 2023-04-09 NOTE — Patient Instructions (Addendum)
Please schedule follow up scheduled with myself in 3 months.  If my schedule is not open yet, we will contact you with a reminder closer to that time. Please call [phone] if you haven't heard from [place] a month before.    Before your next visit I would like you to have: PFTs - 1 hour  For your copd:  Stop trelegy inhaler. Switch to breztri 2 puffs twice a day, gargle after use to prevent thrush  For allergies: Continue the singulair and astelin nasal spray. Can take astelin in the morning and add flonase at night time.  Stop cetirizine, switch to levocetirizine or xyzal.   GERD: Keep taking reflux medication.

## 2023-04-09 NOTE — Progress Notes (Signed)
Yolanda White    [id number]    1971-03-12  Primary Care Physician:Avbuere, Dorma Russell, MD  Referring Physician: Fleet Contras, MD [address] [place],  [place] [address] Reason for Consultation: asthma and copd Date of Consultation: 04/09/2023  Chief complaint:   Chief Complaint  Patient presents with   Consult    CTA 02/04/23 ACT 19 SOB     HPI: Yolanda White is a 52 y.o. woman with past medical history of fibromyalgia, former tobacco use, breast cancer s/p mastectomy in 2016 (in remission,) here for new patient patient evaluation for shortness of breath.  Recently hospitalized at [facility] long for asthma exacerbation and pulmonary edema. Having worsening chest pressure, weight gain she attributes to pulmonary edema, coughing, fatigue. She is no longer taking lasix.   Hospitalized or ED visit three times in the last six months.   Developed shortness of breath initially in her late 74s, early 7s.   Current Regimen: trelegy inhaler 1 puff once daily, albuterol prn. Nebulizer machine.  Asthma Triggers: seasonal allergies, strong smells, perfumes, cigarettes Exacerbations in the last year: at least 4 in the last 6 months History of hospitalization or intubation: yes, but never intubated Allergy Testing: yes  sensitive cats, dogs, birds, mold GERD: yes on pantoprazole 40 mg once daily.  Allergic Rhinitis: montelukast, astelin nasal spray, zyrtec ACT:  Asthma Control Test ACT Total Score  04/09/2023  8:38 AM 19   FeNO:    Social history:  Occupation: disabled for anxiety and depression.  Exposures: lives with daughter. Smoking history: 120 pack years, quit in December 2023.   Social History   Occupational History   Not on file  Tobacco Use   Smoking status: Former    Packs/day: 3.00    Years: 40.00    Additional pack years: 0.00    Total pack years: 120.00    Types: Cigarettes    Quit date: 09/30/2022    Years since quitting: 0.5   Smokeless  tobacco: Never  Vaping Use   Vaping Use: Never used  Substance and Sexual Activity   Alcohol use: No    Alcohol/week: 0.0 standard drinks of alcohol   Drug use: Yes    Types: Marijuana   Sexual activity: Yes    Birth control/protection: Surgical    Relevant family history:  Family History  Problem Relation Age of Onset   Diabetes Mother    Diabetes Father    Lung cancer Father 4       smoker   Asthma Sister    Asthma Brother    Diabetes Brother    Diabetes Brother        maternal half brother   Asthma Brother    Asthma Maternal Grandmother    Heart attack Paternal Grandmother        in her 49s   Asthma Maternal Aunt    Cancer Paternal Aunt        cervical    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   Depression    Myalgia    Personal history of radiation therapy    Seasonal allergies     Past Surgical History:  Procedure Laterality Date   ABDOMINAL HYSTERECTOMY  BREAST LUMPECTOMY Left 09/2015   RADIOACTIVE SEED GUIDED PARTIAL MASTECTOMY WITH AXILLARY SENTINEL LYMPH NODE BIOPSY Left 10/11/2015   Procedure: RADIOACTIVE SEED LOCALIZATION LEFT BREAST LUMPECTOMY WITH LEFT  AXILLARY SENTINEL LYMPH NODE BIOPSY;  Surgeon: Glenna Fellows, MD;  Location: [facility];  Service: General;  Laterality: Left;   TUBAL LIGATION       Physical Exam: Blood pressure 118/82, pulse 78, temperature (!) 97 F (36.1 C), temperature source Temporal, height 5\' 2"  (1.575 m), weight 249 lb (112.9 kg), SpO2 96 %. Gen:      No acute distress, obese ENT:  +cobblestoning, inflammed nasal passages, no nasal polyps, mucus membranes moist Lungs:    No increased respiratory effort, symmetric chest wall excursion, clear to auscultation bilaterally, no wheezes or crackles CV:          Regular rate and rhythm; no murmurs, rubs, or gallops.  No pedal edema Abd:      + bowel sounds; soft, non-tender; no distension MSK: no acute synovitis of DIP or PIP joints, no mechanics hands.  Skin:      Warm and dry; no rashes Neuro: normal speech, no focal facial asymmetry Psych: alert and oriented x3, normal mood and affect   Data Reviewed/Medical Decision Making:  Independent interpretation of tests: Imaging:  Review of patient's CTPE images April 2024 revealed no PE, mild bilateral atelectasis. The patient's images have been independently reviewed by me.    PFTs: Echocardiogram April 2024 shows preserved LVEF  Labs: Lab Results  Component Value Date   NA 139 03/06/2023   K 4.7 03/06/2023   CO2 22 03/06/2023   GLUCOSE 129 (H) 03/06/2023   BUN 19 03/06/2023   CREATININE 1.02 03/06/2023   CALCIUM 9.4 03/06/2023   EGFR 67 03/06/2023   GFRNONAA >60 02/05/2023   Lab Results  Component Value Date   WBC 9.1 03/06/2023   HGB 12.9 03/06/2023   HCT 38.9 03/06/2023   MCV 84.2 03/06/2023   PLT 284 03/06/2023     Immunization status:  Immunization History  Administered Date(s) Administered   Influenza,inj,Quad PF,6+ Mos 09/13/2015   Pneumococcal Polysaccharide-23 02/07/2016     I reviewed prior external note(s) from ED, hospital stay, care everywhere  I reviewed the result(s) of the labs and imaging as noted above.   I have ordered PFT   Assessment:  Severe persistent asthma with COPD overlap syndrome GERD Allergic rhinitis  Plan/Recommendations: Will obtain PFTs - 1 hour. FeNO:   For your copd:  Stop trelegy inhaler. Switch to breztri 2 puffs twice a day, gargle after use to prevent thrush  For allergies: Continue the singulair and astelin nasal spray. Can take astelin in the morning and add flonase at night time.  Stop cetirizine, switch to levocetirizine or xyzal.   GERD: Keep taking reflux medication.   No obvious evidence of fluid overload on exam  today.   We discussed disease management and progression at length today.    Return to Care: Return in about 3 months (around 07/10/2023) for follow up with PFT and same day appointment.  Durel Salts, MD Pulmonary and Critical Care Medicine [facility] Office:[phone]  CC: Fleet Contras, MD

## 2023-09-05 ENCOUNTER — Other Ambulatory Visit: Payer: Self-pay | Admitting: Internal Medicine

## 2023-09-05 NOTE — Telephone Encounter (Signed)
Refill request for [employer].  Per chart note 04/09/2023 Dr. Celine Mans:  For your copd:  Stop trelegy inhaler. Switch to breztri 2 puffs twice a day, gargle after use to prevent thrush  Ok refill.

## 2023-09-11 ENCOUNTER — Ambulatory Visit: Payer: 59 | Admitting: Internal Medicine

## 2023-10-27 ENCOUNTER — Emergency Department (HOSPITAL_COMMUNITY): Payer: 59

## 2023-10-27 ENCOUNTER — Other Ambulatory Visit: Payer: Self-pay

## 2023-10-27 ENCOUNTER — Encounter (HOSPITAL_COMMUNITY): Payer: Self-pay

## 2023-10-27 ENCOUNTER — Emergency Department (HOSPITAL_COMMUNITY)
Admission: EM | Admit: 2023-10-27 | Discharge: 2023-10-28 | Discharge status: Against Medical Advice | Payer: 59 | Attending: Medical | Admitting: Medical

## 2023-10-27 DIAGNOSIS — R0602 Shortness of breath: Secondary | ICD-10-CM | POA: Diagnosis present

## 2023-10-27 DIAGNOSIS — H9209 Otalgia, unspecified ear: Secondary | ICD-10-CM | POA: Insufficient documentation

## 2023-10-27 DIAGNOSIS — J029 Acute pharyngitis, unspecified: Secondary | ICD-10-CM | POA: Diagnosis not present

## 2023-10-27 DIAGNOSIS — Z7951 Long term (current) use of inhaled steroids: Secondary | ICD-10-CM | POA: Insufficient documentation

## 2023-10-27 DIAGNOSIS — R059 Cough, unspecified: Secondary | ICD-10-CM | POA: Insufficient documentation

## 2023-10-27 DIAGNOSIS — J45909 Unspecified asthma, uncomplicated: Secondary | ICD-10-CM | POA: Diagnosis not present

## 2023-10-27 DIAGNOSIS — Z5321 Procedure and treatment not carried out due to patient leaving prior to being seen by health care provider: Secondary | ICD-10-CM | POA: Diagnosis not present

## 2023-10-27 DIAGNOSIS — Z20822 Contact with and (suspected) exposure to covid-19: Secondary | ICD-10-CM | POA: Diagnosis not present

## 2023-10-27 DIAGNOSIS — R0989 Other specified symptoms and signs involving the circulatory and respiratory systems: Secondary | ICD-10-CM | POA: Diagnosis not present

## 2023-10-27 LAB — RESP PANEL BY RT-PCR (RSV, FLU A&B, COVID)  RVPGX2
Influenza A by PCR: NEGATIVE
Influenza B by PCR: NEGATIVE
Resp Syncytial Virus by PCR: POSITIVE — AB
SARS Coronavirus 2 by RT PCR: NEGATIVE

## 2023-10-27 MED ORDER — IPRATROPIUM-ALBUTEROL 0.5-2.5 (3) MG/3ML IN SOLN
3.0000 mL | Freq: Once | RESPIRATORY_TRACT | Status: AC
  Administered 2023-10-27: 3 mL via RESPIRATORY_TRACT
  Filled 2023-10-27: qty 3

## 2023-10-27 MED ORDER — ALBUTEROL SULFATE HFA 108 (90 BASE) MCG/ACT IN AERS
2.0000 | INHALATION_SPRAY | RESPIRATORY_TRACT | Status: DC | PRN
  Administered 2023-10-27: 2 via RESPIRATORY_TRACT
  Filled 2023-10-27: qty 6.7

## 2023-10-27 MED ORDER — PREDNISONE 20 MG PO TABS
60.0000 mg | ORAL_TABLET | Freq: Once | ORAL | Status: AC
  Administered 2023-10-27: 60 mg via ORAL
  Filled 2023-10-27: qty 3

## 2023-10-27 NOTE — ED Provider Triage Note (Signed)
Emergency Medicine Provider Triage Evaluation Note  Yolanda White , a 53 y.o. female  was evaluated in triage.  Pt complains of sob, cough x3 days. Runny nose, sore throat, ear pain x3 days. Using albuterol inhaler w/o relief..  Review of Systems  Positive: sob Negative: fever  Physical Exam  BP (!) 136/95 (BP Location: Right Arm)   Pulse (!) 117   Temp 97.9 F (36.6 C)   Resp 18   Ht 5\' 2"  (1.575 m)   Wt 108.9 kg   SpO2 96%   BMI 43.90 kg/m  Gen:   Awake, +increased work Resp:  Increased effort, audible expiratory wheezing, no retractions, difficulty speaking in full sentances  MSK:   Moves extremities without difficulty  Other:   Medical Decision Making  Medically screening exam initiated at 9:14 PM.  Appropriate orders placed.  Yolanda White was informed that the remainder of the evaluation will be completed by another provider, this initial triage assessment does not replace that evaluation, and the importance of remaining in the ED until their evaluation is complete.  Needs priority room   Pete Pelt, [place] 10/27/23 2116

## 2023-10-27 NOTE — ED Notes (Signed)
Gave Neb in triage

## 2023-10-27 NOTE — ED Triage Notes (Signed)
Pt arrived POV d/t SOB for 3 days but worse today - has audible  Exp wheezes in triage.  Hx of asthma.

## 2023-10-28 NOTE — ED Notes (Signed)
Pt was called x3 for vitals no answer

## 2023-11-18 ENCOUNTER — Encounter (HOSPITAL_BASED_OUTPATIENT_CLINIC_OR_DEPARTMENT_OTHER): Payer: 59

## 2023-11-18 ENCOUNTER — Ambulatory Visit: Payer: 59 | Admitting: Internal Medicine

## 2023-12-23 DIAGNOSIS — J309 Allergic rhinitis, unspecified: Secondary | ICD-10-CM | POA: Insufficient documentation

## 2023-12-30 ENCOUNTER — Encounter (HOSPITAL_BASED_OUTPATIENT_CLINIC_OR_DEPARTMENT_OTHER): Payer: 59

## 2023-12-30 ENCOUNTER — Encounter (HOSPITAL_BASED_OUTPATIENT_CLINIC_OR_DEPARTMENT_OTHER): Payer: Self-pay | Admitting: Internal Medicine

## 2023-12-30 ENCOUNTER — Ambulatory Visit: Payer: 59 | Admitting: Internal Medicine

## 2023-12-30 DIAGNOSIS — H1045 Other chronic allergic conjunctivitis: Secondary | ICD-10-CM | POA: Insufficient documentation

## 2023-12-30 DIAGNOSIS — T783XXA Angioneurotic edema, initial encounter: Secondary | ICD-10-CM | POA: Insufficient documentation

## 2023-12-30 DIAGNOSIS — Z720 Tobacco use: Secondary | ICD-10-CM | POA: Insufficient documentation

## 2023-12-30 DIAGNOSIS — J449 Chronic obstructive pulmonary disease, unspecified: Secondary | ICD-10-CM | POA: Insufficient documentation

## 2024-02-06 ENCOUNTER — Encounter: Payer: Self-pay | Admitting: Internal Medicine

## 2024-03-25 ENCOUNTER — Other Ambulatory Visit: Payer: Self-pay | Admitting: Internal Medicine

## 2024-04-28 ENCOUNTER — Other Ambulatory Visit: Payer: Self-pay | Admitting: Internal Medicine

## 2024-04-29 ENCOUNTER — Other Ambulatory Visit: Payer: Self-pay | Admitting: Internal Medicine

## 2024-05-04 ENCOUNTER — Other Ambulatory Visit: Payer: Self-pay | Admitting: Internal Medicine

## 2024-06-07 ENCOUNTER — Other Ambulatory Visit: Payer: Self-pay | Admitting: Internal Medicine

## 2024-07-22 ENCOUNTER — Other Ambulatory Visit: Payer: Self-pay | Admitting: Internal Medicine

## 2024-09-26 ENCOUNTER — Other Ambulatory Visit: Payer: Self-pay

## 2024-09-26 ENCOUNTER — Emergency Department

## 2024-09-26 ENCOUNTER — Emergency Department: Admission: EM | Admit: 2024-09-26 | Discharge: 2024-09-26 | Disposition: A | Discharge status: Home / Self Care

## 2024-09-26 ENCOUNTER — Encounter: Payer: Self-pay | Admitting: Emergency Medicine

## 2024-09-26 DIAGNOSIS — J45909 Unspecified asthma, uncomplicated: Secondary | ICD-10-CM | POA: Diagnosis not present

## 2024-09-26 DIAGNOSIS — I509 Heart failure, unspecified: Secondary | ICD-10-CM | POA: Diagnosis not present

## 2024-09-26 DIAGNOSIS — J449 Chronic obstructive pulmonary disease, unspecified: Secondary | ICD-10-CM | POA: Diagnosis not present

## 2024-09-26 DIAGNOSIS — R059 Cough, unspecified: Secondary | ICD-10-CM | POA: Diagnosis present

## 2024-09-26 DIAGNOSIS — J069 Acute upper respiratory infection, unspecified: Secondary | ICD-10-CM | POA: Insufficient documentation

## 2024-09-26 DIAGNOSIS — B9789 Other viral agents as the cause of diseases classified elsewhere: Secondary | ICD-10-CM | POA: Diagnosis not present

## 2024-09-26 LAB — URINALYSIS, ROUTINE W REFLEX MICROSCOPIC
Bilirubin Urine: NEGATIVE
Glucose, UA: NEGATIVE mg/dL
Hgb urine dipstick: NEGATIVE
Ketones, ur: NEGATIVE mg/dL
Leukocytes,Ua: NEGATIVE
Nitrite: NEGATIVE
Protein, ur: NEGATIVE mg/dL
Specific Gravity, Urine: 1.012 (ref 1.005–1.030)
pH: 5 (ref 5.0–8.0)

## 2024-09-26 LAB — RESP PANEL BY RT-PCR (RSV, FLU A&B, COVID)  RVPGX2
Influenza A by PCR: NEGATIVE
Influenza B by PCR: NEGATIVE
Resp Syncytial Virus by PCR: NEGATIVE
SARS Coronavirus 2 by RT PCR: NEGATIVE

## 2024-09-26 LAB — GROUP A STREP BY PCR: Group A Strep by PCR: NOT DETECTED

## 2024-09-26 MED ORDER — ALBUTEROL SULFATE HFA 108 (90 BASE) MCG/ACT IN AERS: 2.0000 | INHALATION_SPRAY | Freq: Four times a day (QID) | RESPIRATORY_TRACT | 0 refills | Status: AC | PRN

## 2024-09-26 MED ORDER — PREDNISONE 20 MG PO TABS
60.0000 mg | ORAL_TABLET | Freq: Once | ORAL | Status: AC
  Administered 2024-09-26: 60 mg via ORAL
  Filled 2024-09-26: qty 3

## 2024-09-26 MED ORDER — BENZONATATE 100 MG PO CAPS: 200.0000 mg | ORAL_CAPSULE | Freq: Three times a day (TID) | ORAL | 1 refills | Status: AC | PRN

## 2024-09-26 MED ORDER — PREDNISONE 20 MG PO TABS: 20.0000 mg | ORAL_TABLET | Freq: Two times a day (BID) | ORAL | 0 refills | Status: AC

## 2024-09-26 MED ORDER — BENZONATATE 100 MG PO CAPS
200.0000 mg | ORAL_CAPSULE | Freq: Once | ORAL | Status: AC
  Administered 2024-09-26: 200 mg via ORAL
  Filled 2024-09-26: qty 2

## 2024-09-26 NOTE — ED Notes (Signed)
 Pt declined discharge vitals

## 2024-09-26 NOTE — ED Notes (Signed)
 Pt back from xray at this time.

## 2024-09-26 NOTE — ED Triage Notes (Signed)
 Pt via POV from home. Pt c/o sore throat, cough, fever, runny nose, and fatigue for the past 2 days. Reports she has been around her grandkids who have been sick. Pt is A&OX4 and NAD, ambulatory to triage.

## 2024-09-26 NOTE — ED Provider Notes (Signed)
 [facility] Emergency Department Provider Note     Event Date/Time   First MD Initiated Contact with Patient 09/26/24 2032     (approximate)   History   Fever   HPI  Yolanda White is a 53 y.o. female with a history of asthma, anxiety, depression, thyroid disease, CHF, COPD, seasonal allergies, who presents to the ED endorsing sore throat, cough, fever, runny nose, generalized fatigue.  Patient endorsed 2 days of symptoms.  She reports similar symptoms and her grandkids who have been sick as well.  She presents to the ED for evaluation of her symptoms.   Physical Exam   Triage Vital Signs: ED Triage Vitals  Encounter Vitals Group     BP 09/26/24 1843 113/74     Girls Systolic BP Percentile --      Girls Diastolic BP Percentile --      Boys Systolic BP Percentile --      Boys Diastolic BP Percentile --      Pulse Rate 09/26/24 1843 79     Resp 09/26/24 1843 18     Temp 09/26/24 1843 98.3 F (36.8 C)     Temp Source 09/26/24 1843 Oral     SpO2 09/26/24 1843 98 %     Weight 09/26/24 1841 194 lb (88 kg)     Height 09/26/24 1841 5' 2 (1.575 m)     Head Circumference --      Peak Flow --      Pain Score 09/26/24 1840 6     Pain Loc --      Pain Education --      Exclude from Growth Chart --     Most recent vital signs: Vitals:   09/26/24 1843 09/26/24 2056  BP: 113/74   Pulse: 79   Resp: 18   Temp: 98.3 F (36.8 C) 97.9 F (36.6 C)  SpO2: 98%     General Awake, no distress. NAD HEENT NCAT. PERRL. EOMI. No rhinorrhea. Mucous membranes are moist.  TMs intact bilaterally without evidence of purulent or serous effusions CV:  Good peripheral perfusion. RRR RESP:  Normal effort. CTA.  Intermittent cough noted.  No wheeze, rales, or rhonchi noted ABD:  No distention.    ED Results / Procedures / Treatments   Labs (all labs ordered are listed, but only abnormal results are displayed) Labs Reviewed  URINALYSIS, ROUTINE W REFLEX  MICROSCOPIC - Abnormal; Notable for the following components:      Result Value   Color, Urine YELLOW (*)    APPearance CLEAR (*)    All other components within normal limits  RESP PANEL BY RT-PCR (RSV, FLU A&B, COVID)  RVPGX2  GROUP A STREP BY PCR     EKG   RADIOLOGY  I personally viewed and evaluated these images as part of my medical decision making, as well as reviewing the written report by the radiologist.  ED Provider Interpretation: No acute findings  DG Chest 2 View Result Date: 09/26/2024 CLINICAL DATA:  Cough with fever EXAM: CHEST - 2 VIEW COMPARISON:  Chest x-ray 10/27/2023 FINDINGS: The heart size and mediastinal contours are within normal limits. Surgical clips are again seen in the anterior left chest. No definite focal lung infiltrate, pleural effusion or pneumothorax. The visualized skeletal structures are unremarkable. IMPRESSION: No active cardiopulmonary disease. Electronically Signed   By: Greig Pique M.D.   On: 09/26/2024 21:43     PROCEDURES:  Critical Care performed: No  Procedures   MEDICATIONS ORDERED IN ED: Medications  predniSONE  (DELTASONE ) tablet 60 mg (60 mg Oral Given 09/26/24 2159)  benzonatate  (TESSALON ) capsule 200 mg (200 mg Oral Given 09/26/24 2200)     IMPRESSION / MDM / ASSESSMENT AND PLAN / ED COURSE  I reviewed the triage vital signs and the nursing notes.                              Differential diagnosis includes, but is not limited to, COVID, flu, RSV, viral URI, strep pharyngitis, bronchitis, COPD exacerbation, CAP  Patient's presentation is most consistent with acute complicated illness / injury requiring diagnostic workup.  Patient's diagnosis is consistent with viral URI with cough.  Patient presents endorsing several days of intermittent cough, congestion, and malaise.  She has been taking her home meds as prescribed in addition to OTC multisymptom cough medicine.  Her exam is reassuring as it shows no evidence of  any fever, tachycardia, or tachypnea.  No hypoxia on exam.  Viral panel test is negative.  Strep PCR is also negative.  No UA evidence of acute cystitis.  Chest x-ray interpreted by me, shows no evidence of any acute infectious etiology.. Patient will be discharged home with prescriptions for albuterol  MDI, Tessalon  Perles, and prednisone . Patient is to follow up with her primary provider as needed or otherwise directed. Patient is given ED precautions to return to the ED for any worsening or new symptoms.     FINAL CLINICAL IMPRESSION(S) / ED DIAGNOSES   Final diagnoses:  Viral URI with cough     Rx / DC Orders   ED Discharge Orders          Ordered    predniSONE  (DELTASONE ) 20 MG tablet  2 times daily with meals        09/26/24 2151    benzonatate  (TESSALON ) 100 MG capsule  3 times daily PRN        09/26/24 2151    albuterol  (VENTOLIN  HFA) 108 (90 Base) MCG/ACT inhaler  Every 6 hours PRN        09/26/24 2152             Note:  This document was prepared using Dragon voice recognition software and may include unintentional dictation errors.    Loyd Candida LULLA Aldona, PA-C 09/26/24 2353    Floy Roberts, MD 09/27/24 [phone]

## 2024-09-26 NOTE — Discharge Instructions (Addendum)
 Your exam, labs, and x-ray are normal and reassuring at this time.  No signs of any serious infections going on.  Take the prescription meds as directed.  Continue with your home meds as prescribed.  Follow with your primary provider for ongoing evaluation.

## 2024-10-12 ENCOUNTER — Ambulatory Visit (INDEPENDENT_AMBULATORY_CARE_PROVIDER_SITE_OTHER): Admitting: Family Medicine

## 2024-10-12 ENCOUNTER — Encounter: Payer: Self-pay | Admitting: Family Medicine

## 2024-10-12 VITALS — BP 107/76 | HR 84 | Temp 98.4°F | Wt 204.0 lb

## 2024-10-12 DIAGNOSIS — Z1211 Encounter for screening for malignant neoplasm of colon: Secondary | ICD-10-CM

## 2024-10-12 DIAGNOSIS — C50212 Malignant neoplasm of upper-inner quadrant of left female breast: Secondary | ICD-10-CM

## 2024-10-12 DIAGNOSIS — L988 Other specified disorders of the skin and subcutaneous tissue: Secondary | ICD-10-CM

## 2024-10-12 DIAGNOSIS — Z1231 Encounter for screening mammogram for malignant neoplasm of breast: Secondary | ICD-10-CM

## 2024-10-12 NOTE — Progress Notes (Unsigned)
 New Patient Office Visit  Subjective    Patient ID: Yolanda White, female    DOB: 1971-04-21  Age: 53 y.o. MRN: [id number]  CC:  Chief Complaint  Patient presents with   Establish Care    HPI Yolanda White presents to establish care Discussed the use of AI scribe software for clinical note transcription with the patient, who gave verbal consent to proceed.  History of Present Illness       Outpatient Encounter Medications as of 10/12/2024  Medication Sig   albuterol  (PROVENTIL ) (2.5 MG/3ML) 0.083% nebulizer solution Take 3 mLs (2.5 mg total) by nebulization every 4 (four) hours as needed for shortness of breath. Use either inhaler OR nebulizer but not both together.   albuterol  (VENTOLIN  HFA) 108 (90 Base) MCG/ACT inhaler Inhale 2 puffs into the lungs every 6 (six) hours as needed for wheezing or shortness of breath. Use either inhaler OR nebulizer but not both together.   azelastine (ASTELIN) 0.1 % nasal spray Place into both nostrils 2 (two) times daily. Use in each nostril as directed   benzonatate  (TESSALON ) 100 MG capsule Take 2 capsules (200 mg total) by mouth 3 (three) times daily as needed for cough.   celecoxib (CELEBREX) 200 MG capsule Take 200 mg by mouth daily.   cetirizine  (ZYRTEC ) 10 MG tablet Take 10 mg by mouth daily.   DULoxetine (CYMBALTA) 30 MG capsule Take 30 mg by mouth every morning.   EPINEPHrine  0.3 mg/0.3 mL IJ SOAJ injection Inject 0.3 mg into the muscle as needed.   fluticasone  (FLONASE) 50 MCG/ACT nasal spray Place 2 sprays into both nostrils daily.   furosemide  (LASIX ) 20 MG tablet Take 20 mg by mouth daily.   ibuprofen  (ADVIL ) 800 MG tablet Take 800 mg by mouth every 8 (eight) hours as needed.   lidocaine  (LIDODERM ) 5 % 1 patch daily.   nabumetone (RELAFEN) 750 MG tablet Take 750 mg by mouth 2 (two) times daily.   OZEMPIC, 0.25 OR 0.5 MG/DOSE, 2 MG/3ML SOPN Inject into the skin.   pantoprazole (PROTONIX) 40 MG tablet Take 40 mg  by mouth 2 (two) times daily.   polyethylene glycol (MIRALAX  / GLYCOLAX ) 17 g packet Take 17 g by mouth 2 (two) times daily. Decrease to daily if having watery stools.   tiZANidine (ZANAFLEX) 4 MG tablet Take 4 mg by mouth 3 (three) times daily as needed for muscle spasms.   Vitamin D , Ergocalciferol , (DRISDOL) 1.25 MG (50000 UNIT) CAPS capsule Take 50,000 Units by mouth once a week.   budesonide-glycopyrrolate -formoterol (BREZTRI  AEROSPHERE) 160-9-4.8 MCG/ACT AERO inhaler Inhale 2 puffs into the lungs in the morning and at bedtime. (Patient not taking: Reported on 10/12/2024)   levocetirizine (XYZAL ) 5 MG tablet Take 1 tablet (5 mg total) by mouth every evening. (Patient not taking: Reported on 10/12/2024)   montelukast  (SINGULAIR ) 10 MG tablet Take 1 tablet (10 mg total) by mouth at bedtime. (Patient not taking: Reported on 10/12/2024)   ondansetron  (ZOFRAN -ODT) 4 MG disintegrating tablet Take 4 mg by mouth every 8 (eight) hours as needed for nausea or vomiting. (Patient not taking: Reported on 10/12/2024)   [DISCONTINUED] gabapentin  (NEURONTIN ) 100 MG capsule Take 1 capsule (100 mg total) by mouth at bedtime. (Patient not taking: Reported on 11/18/2017)   [DISCONTINUED] omeprazole  (PRILOSEC) 20 MG capsule Take 1 capsule (20 mg total) by mouth daily.   No facility-administered encounter medications on file as of 10/12/2024.    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   Depression    Myalgia    Personal history of radiation therapy    Seasonal allergies     Past Surgical History:  Procedure Laterality Date   ABDOMINAL HYSTERECTOMY     BREAST LUMPECTOMY Left 09/2015   RADIOACTIVE SEED GUIDED PARTIAL MASTECTOMY WITH AXILLARY SENTINEL LYMPH NODE BIOPSY Left 10/11/2015   Procedure:  RADIOACTIVE SEED LOCALIZATION LEFT BREAST LUMPECTOMY WITH LEFT  AXILLARY SENTINEL LYMPH NODE BIOPSY;  Surgeon: Morene Olives, MD;  Location: [facility];  Service: General;  Laterality: Left;   TUBAL LIGATION      Family History  Problem Relation Age of Onset   Diabetes Mother    Diabetes Father    Lung cancer Father 3       smoker   Asthma Sister    Asthma Brother    Diabetes Brother    Diabetes Brother        maternal half brother   Asthma Brother    Asthma Maternal Grandmother    Heart attack Paternal Grandmother        in her 40s   Asthma Maternal Aunt    Cancer Paternal Aunt        cervical    Social History   Socioeconomic History   Marital status: Single    Spouse name: Not on file   Number of children: 3   Years of education: Not on file   Highest education level: Not on file  Occupational History   Not on file  Tobacco Use   Smoking status: Some Days    Current packs/day: 0.00    Average packs/day: 3.0 packs/day for 40.0 years (120.0 ttl pk-yrs)    Types: Cigarettes    Start date: 09/30/1982    Last attempt to quit: 09/30/2022    Years since quitting: 2.0   Smokeless tobacco: Never  Vaping Use   Vaping status: Never Used  Substance and Sexual Activity   Alcohol use: No    Alcohol/week: 0.0 standard drinks of alcohol   Drug use: Yes    Types: Marijuana   Sexual activity: Yes    Birth control/protection: Surgical  Other Topics Concern   Not on file  Social History Narrative   Not on file   Social Drivers of Health   Tobacco Use: High Risk (10/12/2024)   Patient History    Smoking Tobacco Use: Some Days    Smokeless Tobacco Use: Never    Passive Exposure: Not on file  Financial Resource Strain: Not on file  Food Insecurity: No Food Insecurity (02/01/2023)   Hunger Vital Sign    Worried About Running Out of Food in the Last Year: Never true    Ran Out of Food in the Last Year: Never true   Transportation Needs: No Transportation Needs (02/01/2023)   PRAPARE - Administrator, Civil Service (Medical): No    Lack of Transportation (Non-Medical): No  Physical Activity: Not on file  Stress: Not on file  Social Connections: Not on file  Intimate Partner Violence: Not At Risk (01/09/2024)   Received from [facility]    Abuse Screen    Physical Signs of Abuse Present: no    Does Anyone Try to Keep You From  Having Contact with Others or Doing Things Outside Your Home?: no    Feels Unsafe at Home or Work/School: no    Feels Threatened by Someone: no  Depression (PHQ2-9): Not on file  Alcohol Screen: Not on file  Housing: Low Risk (02/01/2023)   Housing    Last Housing Risk Score: 0  Utilities: Not At Risk (02/01/2023)   AHC Utilities    Threatened with loss of utilities: No  Health Literacy: Not on file          Objective   BP 107/76   Pulse 84   Temp 98.4 F (36.9 C) (Oral)   Wt 204 lb (92.5 kg)   SpO2 97%   BMI 37.31 kg/m  {Vitals History (Optional):23777}  Physical Exam   {Perform Simple Foot Exam  Perform Detailed exam:1} {Insert foot Exam (Optional):30965}   {Labs (Optional):23779}  The 10-year ASCVD risk score (Arnett DK, et al., 2019) is: 3.1%     Assessment & Plan:  There are no diagnoses linked to this encounter.  No follow-ups on file.   Basil Buffin K Ja Ohman, MD

## 2024-10-13 DIAGNOSIS — L988 Other specified disorders of the skin and subcutaneous tissue: Secondary | ICD-10-CM | POA: Insufficient documentation

## 2024-10-13 NOTE — Assessment & Plan Note (Signed)
 Has not had a mammogram in 4-5 years.  Is lost to follow-up with Oncology.  Agrees to get a mammogram

## 2024-10-13 NOTE — Assessment & Plan Note (Signed)
 She has no family history of colon cancer.  It is appropriate to do a Cologuard.

## 2024-10-13 NOTE — Assessment & Plan Note (Signed)
 Hyperpigmented, atrophic macule on her back, 2.5 cm.  Another on her right leg 2 cm.  Biopsy in the office today

## 2024-10-14 ENCOUNTER — Other Ambulatory Visit (HOSPITAL_COMMUNITY): Admission: RE | Admit: 2024-10-14 | Source: Ambulatory Visit

## 2024-10-14 DIAGNOSIS — L988 Other specified disorders of the skin and subcutaneous tissue: Secondary | ICD-10-CM | POA: Insufficient documentation

## 2024-10-14 NOTE — Addendum Note (Signed)
 Addended by: Pilar Westergaard A on: 10/14/2024 12:28 PM   Modules accepted: Orders

## 2024-10-19 ENCOUNTER — Telehealth: Payer: Self-pay | Admitting: Family Medicine

## 2024-10-19 LAB — SURGICAL PATHOLOGY

## 2024-10-19 NOTE — Telephone Encounter (Signed)
 Ask pharmacy to refill her triamcinolone cream 2 times.

## 2024-10-19 NOTE — Addendum Note (Signed)
 Addended by: Izzah Pasqua K on: 10/19/2024 12:45 PM   Modules accepted: Orders

## 2024-10-19 NOTE — Telephone Encounter (Signed)
 Advised the skin biopsy showed a benign condition that responds to steroid cream.  She thinks she has a prescription for this but asked me to call [facility] in [place].

## 2024-10-20 ENCOUNTER — Ambulatory Visit: Payer: Self-pay

## 2024-10-20 NOTE — Telephone Encounter (Signed)
 FYI Only or Action Required?: FYI only for provider: referred pt to urgent care or ED: pt verbalized understanding.  Patient was last seen in primary care on 10/12/2024 by Ziglar, Susan K, MD.  Called Nurse Triage reporting Hypertension.  Symptoms began today.  Interventions attempted: Nothing.  Symptoms are: unchanged.  Triage Disposition: See HCP Within 4 Hours (Or PCP Triage)  Patient/caregiver understands and will follow disposition?: Yes  Copied from CRM #[id number]. Topic: Clinical - Red Word Triage >> Oct 20, 2024 10:33 AM Montie POUR wrote: Red Word that prompted transfer to Nurse Triage:  Ms Timm's blood pressure is 140/90 today. This is high for her. She used to be on blood pressure medication and has not taken any for months. She is dizzy, tired, not feeling good. Reason for Disposition  [1] Systolic BP >= 200 OR Diastolic >= 120 AND [2] having NO cardiac or neurologic symptoms  Answer Assessment - Initial Assessment Questions 1. BLOOD PRESSURE: What is your blood pressure? Did you take at least two measurements 5 minutes apart?     140/90 2. ONSET: When did you take your blood pressure?     today 3. HOW: How did you take your blood pressure? (e.g., automatic home BP monitor, visiting nurse)     automatic 4. HISTORY: Do you have a history of high blood pressure?     no 5. MEDICINES: Are you taking any medicines for blood pressure? Have you missed any doses recently?     Not on BP medication 6. OTHER SYMPTOMS: Do you have any symptoms? (e.g., blurred vision, chest pain, difficulty breathing, headache, weakness)     Tired, dizzy, weak, blurred vision, SOB-at times also has asthma & COPD 7. PREGNANCY: Is there any chance you are pregnant? When was your last menstrual period?     Na  Pt stated she is not currently on any BP medications and has not been on any for some time now: pt also stated that she has been having these s/s on and off for last month.   NP at home with patient now and took BP x2 both readings 140/90.  Protocols used: Blood Pressure - High-A-AH

## 2024-10-22 ENCOUNTER — Encounter: Payer: Self-pay | Admitting: Family Medicine

## 2024-10-22 ENCOUNTER — Ambulatory Visit: Admitting: Family Medicine

## 2024-10-22 VITALS — BP 121/81 | HR 84 | Temp 98.2°F | Resp 16 | Ht 62.0 in | Wt 201.0 lb

## 2024-10-22 DIAGNOSIS — J309 Allergic rhinitis, unspecified: Secondary | ICD-10-CM

## 2024-10-22 DIAGNOSIS — C50919 Malignant neoplasm of unspecified site of unspecified female breast: Secondary | ICD-10-CM

## 2024-10-22 DIAGNOSIS — E118 Type 2 diabetes mellitus with unspecified complications: Secondary | ICD-10-CM | POA: Diagnosis not present

## 2024-10-22 DIAGNOSIS — Z1231 Encounter for screening mammogram for malignant neoplasm of breast: Secondary | ICD-10-CM | POA: Diagnosis not present

## 2024-10-22 DIAGNOSIS — L989 Disorder of the skin and subcutaneous tissue, unspecified: Secondary | ICD-10-CM | POA: Diagnosis not present

## 2024-10-22 DIAGNOSIS — F322 Major depressive disorder, single episode, severe without psychotic features: Secondary | ICD-10-CM

## 2024-10-22 DIAGNOSIS — Z5982 Transportation insecurity: Secondary | ICD-10-CM | POA: Diagnosis not present

## 2024-10-22 DIAGNOSIS — J4551 Severe persistent asthma with (acute) exacerbation: Secondary | ICD-10-CM | POA: Diagnosis not present

## 2024-10-22 DIAGNOSIS — R112 Nausea with vomiting, unspecified: Secondary | ICD-10-CM | POA: Diagnosis not present

## 2024-10-22 MED ORDER — ONDANSETRON 4 MG PO TBDP: 4.0000 mg | ORAL_TABLET | Freq: Three times a day (TID) | ORAL | 2 refills | Status: DC | PRN

## 2024-10-22 MED ORDER — AZELASTINE HCL 0.1 % NA SOLN: 2.0000 | Freq: Two times a day (BID) | NASAL | 3 refills | Status: AC

## 2024-10-22 MED ORDER — NABUMETONE 750 MG PO TABS: 750.0000 mg | ORAL_TABLET | Freq: Two times a day (BID) | ORAL | 1 refills | Status: DC

## 2024-10-22 MED ORDER — EPINEPHRINE 0.3 MG/0.3ML IJ SOAJ: 0.3000 mg | INTRAMUSCULAR | 1 refills | Status: AC | PRN

## 2024-10-22 NOTE — Assessment & Plan Note (Signed)
 She is on Ozempic 2 mg weekly.  She has not lost any weight probably due to dietary choices and lack of exercise.

## 2024-10-22 NOTE — Assessment & Plan Note (Addendum)
 Depression is all over the place.  Not currently taking any medication for depression.  Does not see a therapist.  She stopped going to therapist.  She was asked to do group therapy and she does not like group so she stopped going to the therapist.

## 2024-10-22 NOTE — Assessment & Plan Note (Signed)
 Her janeal is well controlled with Breztri  and she seldom needs albuterol .  Uses Albuterol  in the spring time and in the dead of winter.  She used it twice yesterday but has not needed it today.

## 2024-10-22 NOTE — Progress Notes (Addendum)
 "  Established Patient Office Visit  Subjective   Patient ID: Yolanda White, female    DOB: 07-08-1971  Age: 53 y.o. MRN: [id number]  Chief Complaint  Patient presents with   Suture / Staple Removal    Needs sutures removed from back.   Hypertension    Nurse came to see her at home and BP was high, 140/90. Pt does not have her own BP cuff. She used to be on blood pressure medication and has not taken any for months. She is dizzy, tired, not feeling good.    HPI Delightful 53 year old with Hx of breast cancer (diagnosed 2017, s/p lumpectomy, radiation and tamoxifen  x 1 year), DMT2 (on Ozempic 2 mg weekly) COPD, anxiety and depression, fibromyalgia, and CHF and benign skin lesion on her back, s/p hysterectomy.  She is here to get her suture removed from the biopsy on her back.  Reassurance that this lesion is benign but longstanding.  Parakeratosis, mild lymphocytic inflammation.  May respond to topical steroids. She has a history of breast cancer, and has not had a mammogram in 4 to 5 years.  She has also been lost to oncology follow-up.  She is willing to get a mammogram done.  She will go back to Oncology.                  Objective:     BP 121/81   Pulse 84   Temp 98.2 F (36.8 C) (Oral)   Resp 16   Ht 5' 2 (1.575 m)   Wt 201 lb (91.2 kg)   SpO2 96%   BMI 36.76 kg/m    Physical Exam Vitals and nursing note reviewed.  Constitutional:      Appearance: Normal appearance.  HENT:     Head: Normocephalic and atraumatic.  Eyes:     Conjunctiva/sclera: Conjunctivae normal.  Cardiovascular:     Rate and Rhythm: Normal rate and regular rhythm.  Pulmonary:     Effort: Pulmonary effort is normal.     Breath sounds: Normal breath sounds.  Musculoskeletal:     Right lower leg: No edema.     Left lower leg: No edema.  Skin:    General: Skin is warm and dry.  Neurological:     Mental Status: She is alert and oriented to person, place, and time.  Psychiatric:         Mood and Affect: Mood normal.        Behavior: Behavior normal.        Thought Content: Thought content normal.        Judgment: Judgment normal.      Suture Removal  Date/Time: 10/23/2024 11:14 AM  Performed by: Layman Gully K, MD Authorized by: Indyah Saulnier K, MD  Body area: trunk Location details: back Wound Appearance: clean Post-removal: dressing applied Facility: sutures placed in this facility Patient tolerance: patient tolerated the procedure well with no immediate complications        No results found for any visits on 10/22/24.    The 10-year ASCVD risk score (Arnett DK, et al., 2019) is: 4%    Assessment & Plan:  Transportation insecurity -     AMB Referral VBCI Care Management  Severe major depressive disorder [facility]) Assessment & Plan: Depression is all over the place.  Not currently taking any medication for depression.  Does not see a therapist.  She stopped going to therapist.  She was asked to do group therapy and she does not  like group so she stopped going to the therapist.   Severe persistent asthma with (acute) exacerbation (HCC) Assessment & Plan: Her janeal is well controlled with Breztri  and she seldom needs albuterol .  Uses Albuterol  in the spring time and in the dead of winter.  She used it twice yesterday but has not needed it today.     Morbid obesity (HCC) Assessment & Plan: She is on Ozempic 2 mg weekly.  She has not lost any weight probably due to dietary choices and lack of exercise.   Malignant neoplasm of female breast, unspecified estrogen receptor status, unspecified laterality, unspecified site of breast (HCC) -     Ambulatory referral to Hematology / Oncology  Encounter for screening mammogram for malignant neoplasm of breast  Allergic rhinitis, unspecified seasonality, unspecified trigger Assessment & Plan: Refilled her Astelin  today.  Orders: -     Azelastine  HCl; Place 2 sprays into both nostrils 2 (two) times  daily. Use in each nostril as directed  Dispense: 30 mL; Refill: 3  Controlled diabetes mellitus type 2 with complications (HCC)  Nausea and vomiting, unspecified vomiting type -     Ondansetron ; Take 1 tablet (4 mg total) by mouth every 8 (eight) hours as needed for nausea or vomiting.  Dispense: 20 tablet; Refill: 2  Other orders -     EPINEPHrine ; Inject 0.3 mg into the muscle as needed.  Dispense: 2 each; Refill: 1 -     Nabumetone ; Take 1 tablet (750 mg total) by mouth 2 (two) times daily.  Dispense: 60 tablet; Refill: 1 -     Suture Removal     No follow-ups on file.    Bhavana Kady K Caylan Chenard, MD "

## 2024-10-22 NOTE — Assessment & Plan Note (Signed)
 Refilled her Astelin  today.

## 2024-10-29 ENCOUNTER — Telehealth: Payer: Self-pay

## 2024-10-29 NOTE — Progress Notes (Signed)
 Complex Care Management Note  Care Guide Note 10/29/2024 Name: Yolanda White MRN: [id number] DOB: 30-Mar-1971  Yolanda White is a 54 y.o. year old female who sees Ziglar, Susan K, MD for primary care. I reached out to Yolanda White by phone today to offer complex care management services.  [name] was given information about Complex Care Management services today including:   The Complex Care Management services include support from the care team which includes your Nurse Care Manager, Clinical Social Worker, or Pharmacist.  The Complex Care Management team is here to help remove barriers to the health concerns and goals most important to you. Complex Care Management services are voluntary, and the patient may decline or stop services at any time by request to their care team member.   Complex Care Management Consent Status: Patient agreed to services and verbal consent obtained.   Follow up plan:  Telephone appointment with complex care management team member scheduled for:  1.5.2026  Encounter Outcome:  Patient Scheduled  Jeoffrey Buffalo , RMA     [facility]  [facility] Guide  Direct Dial: [phone]  Website: [email or web address]

## 2024-11-01 ENCOUNTER — Other Ambulatory Visit

## 2024-11-01 NOTE — Patient Instructions (Signed)
 blankVisit Information  Thank you for taking time to visit with me today. Please don't hesitate to contact me if I can be of assistance to you before our next scheduled appointment.  Our next appointment is by telephone on 11/15/24 at 12:00pm Please call the care guide team at [phone] if you need to cancel or reschedule your appointment.   Following is a copy of your care plan:   Goals Addressed             This Visit's Progress    VBCI RN Care Plan       Problems:  Chronic Disease Management support and education needs related to Asthma, COPD, Depression, and Homelessness Financial Constraints. Lacks caregiver support.  Goal: Over the next 90 days the Patient will attend all scheduled medical appointments: with PCP as evidenced by medical record        demonstrate understanding of rationale for each prescribed medication as evidenced by verbal understanding    work with child psychotherapist to address Housing barriers, Limited access to food, and Limited social support related to the management of Asthma and COPD as evidenced by review of electronic medical record and patient or social worker report      Interventions:   Asthma: Provided patient with basic written and verbal Asthma education on self care/management/and exacerbation prevention Advised patient to track and manage Asthma triggers Provided instruction about proper use of medications used for management of Asthma including inhalers Advised patient to self assesses Asthma action plan zone and make appointment with provider if in the yellow zone for 48 hours without improvement Provided education about and advised patient to utilize infection prevention strategies to reduce risk of respiratory infection Screening for signs and symptoms of depression related to chronic disease state    COPD Interventions: Advised patient to track and manage COPD triggers Advised patient to self assesses COPD action plan zone and make  appointment with provider if in the yellow zone for 48 hours without improvement Assessed social determinant of health barriers Discussed the importance of adequate rest and management of fatigue with COPD Provided education about and advised patient to utilize infection prevention strategies to reduce risk of respiratory infection Provided instruction about proper use of medications used for management of COPD including inhalers Screening for signs and symptoms of depression related to chronic disease state   Patient Self-Care Activities:  Attend all scheduled provider appointments Call pharmacy for medication refills 3-7 days in advance of running out of medications Call provider office for new concerns or questions  Perform all self care activities independently  Take medications as prescribed   Work with the social worker to address care coordination needs and will continue to work with the clinical team to address health care and disease management related needs call the Suicide and Crisis Lifeline: 988 call the [place]  National Suicide Prevention Lifeline: 406-100-1278 or TTY: 361-603-2382 TTY 6150319952) to talk to a trained counselor call 1-800-273-TALK (toll free, 24 hour hotline) go to [facility] [address], [place] [phone]) if experiencing a Mental Health or Behavioral Health Crisis  Plan:  Follow up with provider re: oncology 5/6             Please call the Suicide and Crisis Lifeline: 988 call the [place]  National Suicide Prevention Lifeline: (934)795-1295 or TTY: 339-272-7345 TTY 620-703-8115) to talk to a trained counselor call 1-800-273-TALK (toll free, 24 hour hotline) if you are experiencing a Mental Health or Behavioral Health Crisis  or need someone to talk to.  Patient verbalized understanding of Care plan and visit instructions communicated this visit  Doristine Shehan RN RN Care Manager [facility] [phone]

## 2024-11-01 NOTE — Patient Outreach (Signed)
 Complex Care Management   Visit Note  11/01/2024  Name:  Yolanda White MRN: [id number] DOB: February 21, 1971  Situation: Referral received for Complex Care Management related to COPD and Asthma I obtained verbal consent from Patient.  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   Depression    Myalgia    Personal history of radiation therapy    Seasonal allergies     Assessment: Patient Reported Symptoms:  Cognitive Cognitive Status: No symptoms reported Cognitive/Intellectual Conditions Management [RPT]: None reported or documented in medical history or problem list   Health Maintenance Behaviors: Stress management, Annual physical exam Healing Pattern: Average Health Facilitated by: Stress management  Neurological Neurological Review of Symptoms: Headaches Neurological Management Strategies: Routine screening Neurological Self-Management Outcome: 4 (good) Neurological Comment: has a tooth that needs extracting  HEENT HEENT Symptoms Reported: No symptoms reported HEENT Management Strategies: Routine screening HEENT Self-Management Outcome: 4 (good)    Cardiovascular Cardiovascular Symptoms Reported: No symptoms reported Does patient have uncontrolled Hypertension?: No Cardiovascular Management Strategies: Routine screening Cardiovascular Self-Management Outcome: 4 (good)  Respiratory Respiratory Symptoms Reported: No symptoms reported Respiratory Management Strategies: Routine screening Respiratory Self-Management Outcome: 4 (good)  Endocrine Endocrine Symptoms Reported: No symptoms reported Is patient diabetic?: No Endocrine Self-Management Outcome: 4 (good)  Gastrointestinal Gastrointestinal Symptoms Reported:  Constipation Additional Gastrointestinal Details: takes miralax  occassionally Gastrointestinal Management Strategies: Coping strategies Gastrointestinal Self-Management Outcome: 4 (good) Gastrointestinal Comment: goes days without eating due to stress    Genitourinary Genitourinary Symptoms Reported: Urgency Additional Genitourinary Details: mentioned to the md Genitourinary Self-Management Outcome: 4 (good)  Integumentary Integumentary Symptoms Reported: Skin changes, Rash Additional Integumentary Details: did biopsy on lesion on back benign Skin Management Strategies: Routine screening Skin Comment: had breast cANCER  Musculoskeletal Musculoskelatal Symptoms Reviewed: Joint pain, Difficulty walking Additional Musculoskeletal Details: fibromyalgia Musculoskeletal Management Strategies: Routine screening, Adequate rest, Coping strategies Musculoskeletal Self-Management Outcome: 4 (good) Falls in the past year?: No Number of falls in past year: 1 or less Was there an injury with Fall?: No Fall Risk Category Calculator: 0 Patient Fall Risk Level: Low Fall Risk Fall risk Follow up: Education provided, Falls prevention discussed, Follow up appointment  Psychosocial Psychosocial Symptoms Reported: Avoiding people, places, activities, Alteration in sleep habits, Alteration in eating habits, Difficulty concentrating Behavioral Management Strategies: Adequate rest, Coping strategies Behavioral Health Self-Management Outcome: 4 (good) Major Change/Loss/Stressor/Fears (CP): Denies Techniques to Cope with Loss/Stress/Change: Not applicable Quality of Family Relationships: helpful, involved Do you feel physically threatened by others?: No    11/01/2024    PHQ2-9 Depression Screening   Little interest or pleasure in doing things Several days  Feeling down, depressed, or hopeless Several days  PHQ-2 - Total Score 2  Trouble falling or staying asleep, or sleeping too much Several days  Feeling  tired or having little energy Several days  Poor appetite or overeating  Several days  Feeling bad about yourself - or that you are a failure or have let yourself or your family down Several days  Trouble concentrating on things, such as reading the newspaper or watching television Several days  Moving or speaking so slowly that other people could have noticed.  Or the opposite -  being so fidgety or restless that you have been moving around a lot more than usual Several days  Thoughts that you would be better off dead, or hurting yourself in some way Several days  PHQ2-9 Total Score 9  If you checked off any problems, how difficult have these problems made it for you to do your work, take care of things at home, or get along with other people Extremely dIfficult  Depression Interventions/Treatment Medication    There were no vitals filed for this visit. Pain Scale: 0-10 Pain Score: 5  Pain Location: Back Pain Orientation: Left Pain Descriptors / Indicators: Aching  Medications Reviewed Today     Reviewed by Nivia Forester , RN (Registered Nurse) on 11/01/24 at 1426  Med List Status: <None>   Medication Order Taking? Sig Documenting Provider Last Dose Status Informant  albuterol  (PROVENTIL ) (2.5 MG/3ML) 0.083% nebulizer solution [id number]  Take 3 mLs (2.5 mg total) by nebulization every 4 (four) hours as needed for shortness of breath. Use either inhaler OR nebulizer but not both together. Briana Elgin LABOR, MD  Active   albuterol  (VENTOLIN  HFA) 108 (90 Base) MCG/ACT inhaler [id number] Yes Inhale 2 puffs into the lungs every 6 (six) hours as needed for wheezing or shortness of breath. Use either inhaler OR nebulizer but not both together. Menshew, Candida LULLA Kings, PA-C  Active   azelastine  (ASTELIN ) 0.1 % nasal spray [id number]  Place 2 sprays into both nostrils 2 (two) times daily. Use in each nostril as directed Ziglar, Susan K, MD  Active   benzonatate  (TESSALON ) 100 MG capsule [id number] Yes  Take 2 capsules (200 mg total) by mouth 3 (three) times daily as needed for cough. Menshew, Candida LULLA Kings, PA-C  Active   budesonide-glycopyrrolate -formoterol (BREZTRI  AEROSPHERE) 160-9-4.8 MCG/ACT AERO inhaler [id number] Yes Inhale 2 puffs into the lungs in the morning and at bedtime. Desai, Nikita S, MD  Active   cetirizine  (ZYRTEC ) 10 MG tablet [id number] Yes Take 10 mg by mouth daily. [provider]  Active   DULoxetine (CYMBALTA) 30 MG capsule [id number] Yes Take 30 mg by mouth every morning. [provider]  Active Self, Pharmacy Records  EPINEPHrine  0.3 mg/0.3 mL IJ SOAJ injection [id number] Yes Inject 0.3 mg into the muscle as needed. Ziglar, Susan K, MD  Active   fluticasone  (FLONASE) 50 MCG/ACT nasal spray [id number] Yes Place 2 sprays into both nostrils daily. [provider]  Active   furosemide  (LASIX ) 20 MG tablet [id number] Yes Take 20 mg by mouth daily. [provider]  Active   ibuprofen  (ADVIL ) 800 MG tablet [id number] Yes Take 800 mg by mouth every 8 (eight) hours as needed. [provider]  Active   levocetirizine (XYZAL ) 5 MG tablet [id number] Yes Take 1 tablet (5 mg total) by mouth every evening. Meade Verdon RAMAN, MD  Active   lidocaine  (LIDODERM ) 5 % [id number] Yes 1 patch daily. [provider]  Active   montelukast  (SINGULAIR ) 10 MG tablet [id number] Yes Take 1 tablet (10 mg total) by mouth at bedtime. Desai, Nikita S, MD  Active   nabumetone  (RELAFEN ) 750 MG tablet [id number] Yes Take 1 tablet (750 mg total) by mouth 2 (two) times daily. Ziglar, Susan K, MD  Active   ondansetron  (ZOFRAN -ODT) 4 MG disintegrating tablet [id number] Yes Take 1 tablet (4 mg total) by mouth every 8 (eight) hours as needed for nausea or vomiting. Ziglar, Susan K, MD  Active   OZEMPIC, 0.25 OR 0.5 MG/DOSE, 2 MG/3ML [facility] [id number]  Inject 0.25 mg into the skin once a week.  Patient not taking: Reported on 11/01/2024   [provider]  Active    OZEMPIC, 2 MG/DOSE, 8 MG/3ML SOPN [id number] Yes Inject 2 mg into the skin once a week. [provider]  Active   pantoprazole (PROTONIX) 40 MG tablet [id number] Yes Take 40 mg by mouth 2 (two) times daily. [provider]  Active   polyethylene glycol (MIRALAX  / GLYCOLAX ) 17 g packet [id number] Yes Take 17 g by mouth 2 (two) times daily. Decrease to daily if having watery stools. Briana Elgin LABOR, MD  Active   tiZANidine (ZANAFLEX) 4 MG tablet [id number]  Take 4 mg by mouth 3 (three) times daily as needed for muscle spasms.  Patient not taking: Reported on 11/01/2024   [provider]  Active Self, Pharmacy Records  Vitamin D , Ergocalciferol , (DRISDOL) 1.25 MG (50000 UNIT) CAPS capsule [id number] Yes Take 50,000 Units by mouth once a week. [provider]  Active Self, Pharmacy Records           Med Note LEOBARDO, SCHUYLER GORMAN Repress Feb 02, 2023 11:18 AM) Sundays            Recommendation:   Specialty provider follow-up oncology 5/6  Follow Up Plan:   Telephone follow-up in 2 weeks with CCM  Doriann Zuch RN RN Care Manager [facility] [phone]

## 2024-11-02 ENCOUNTER — Inpatient Hospital Stay: Attending: Oncology | Admitting: Oncology

## 2024-11-02 ENCOUNTER — Encounter: Payer: Self-pay | Admitting: Oncology

## 2024-11-02 ENCOUNTER — Inpatient Hospital Stay

## 2024-11-02 VITALS — BP 110/83 | HR 83 | Temp 97.6°F | Resp 18 | Wt 199.1 lb

## 2024-11-02 DIAGNOSIS — Z791 Long term (current) use of non-steroidal anti-inflammatories (NSAID): Secondary | ICD-10-CM | POA: Insufficient documentation

## 2024-11-02 DIAGNOSIS — Z79810 Long term (current) use of selective estrogen receptor modulators (SERMs): Secondary | ICD-10-CM | POA: Insufficient documentation

## 2024-11-02 DIAGNOSIS — A920 Chikungunya virus disease: Secondary | ICD-10-CM | POA: Insufficient documentation

## 2024-11-02 DIAGNOSIS — C50212 Malignant neoplasm of upper-inner quadrant of left female breast: Secondary | ICD-10-CM | POA: Diagnosis not present

## 2024-11-02 DIAGNOSIS — Z809 Family history of malignant neoplasm, unspecified: Secondary | ICD-10-CM | POA: Diagnosis not present

## 2024-11-02 DIAGNOSIS — Z170 Estrogen receptor positive status [ER+]: Secondary | ICD-10-CM | POA: Insufficient documentation

## 2024-11-02 DIAGNOSIS — F17210 Nicotine dependence, cigarettes, uncomplicated: Secondary | ICD-10-CM | POA: Diagnosis not present

## 2024-11-02 DIAGNOSIS — Z801 Family history of malignant neoplasm of trachea, bronchus and lung: Secondary | ICD-10-CM | POA: Diagnosis not present

## 2024-11-02 DIAGNOSIS — Z1732 Human epidermal growth factor receptor 2 negative status: Secondary | ICD-10-CM | POA: Insufficient documentation

## 2024-11-02 DIAGNOSIS — Z8049 Family history of malignant neoplasm of other genital organs: Secondary | ICD-10-CM | POA: Diagnosis not present

## 2024-11-02 DIAGNOSIS — Z923 Personal history of irradiation: Secondary | ICD-10-CM | POA: Insufficient documentation

## 2024-11-02 DIAGNOSIS — Z79899 Other long term (current) drug therapy: Secondary | ICD-10-CM | POA: Insufficient documentation

## 2024-11-02 DIAGNOSIS — N644 Mastodynia: Secondary | ICD-10-CM | POA: Insufficient documentation

## 2024-11-02 DIAGNOSIS — Z1721 Progesterone receptor positive status: Secondary | ICD-10-CM | POA: Insufficient documentation

## 2024-11-02 NOTE — Assessment & Plan Note (Addendum)
 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  She discontinued tamoxifen  due to side effects Patient is overdue for mammogram.  I discussed with the patient that intermittent left breast pain may be secondary to prior surgery.  Usually early stage breast cancer does not cause pain. She does have nodular changes with tenderness on physical examination. I recommend to obtain bilateral diagnostic mammogram for further evaluation.  Recommend genetic counseling given the young age with onset of breast cancer.
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
## 2024-11-02 NOTE — Assessment & Plan Note (Signed)
See above management plan.

## 2024-11-03 ENCOUNTER — Telehealth: Payer: Self-pay | Admitting: *Deleted

## 2024-11-03 NOTE — Progress Notes (Signed)
 Complex Care Management Note Care Guide Note  11/03/2024 Name: Yolanda White MRN: [id number] DOB: [birth year]   Complex Care Management Outreach Attempts: An unsuccessful telephone outreach was attempted today to offer the patient information about available complex care management services.  Follow Up Plan:  Additional outreach attempts will be made to offer the patient complex care management information and services.   Encounter Outcome:  No Answer  [facility] HealthPopulation Health Care Guide  Direct Dial:[phone] [phone] Website: [email or web address]

## 2024-11-04 ENCOUNTER — Telehealth: Payer: Self-pay | Admitting: *Deleted

## 2024-11-04 LAB — COLOGUARD: COLOGUARD: NEGATIVE

## 2024-11-04 NOTE — Progress Notes (Signed)
 Complex Care Management Note Care Guide Note  11/04/2024 Name: Yolanda White MRN: [id number] DOB: 1970-12-12   Complex Care Management Outreach Attempts: An unsuccessful telephone outreach was attempted today to offer the patient information about available complex care management services.  Follow Up Plan:  Additional outreach attempts will be made to offer the patient complex care management information and services.   Encounter Outcome:  No Answer  [facility] HealthPopulation Health Care Guide  Direct Dial:[phone] Fax:[phone] Website: [email or web address]

## 2024-11-05 ENCOUNTER — Ambulatory Visit: Payer: Self-pay | Admitting: Family Medicine

## 2024-11-08 ENCOUNTER — Telehealth: Payer: Self-pay | Admitting: *Deleted

## 2024-11-08 NOTE — Progress Notes (Signed)
 Complex Care Management Note Care Guide Note  11/08/2024 Name: Yolanda White MRN: [id number] DOB: [birth year]   Complex Care Management Outreach Attempts: An unsuccessful telephone outreach was attempted today to offer the patient information about available complex care management services.  Follow Up Plan:  Additional outreach attempts will be made to offer the patient complex care management information and services.   Encounter Outcome:  Patient Request to Call Back  Asencion Gell  [facility] HealthPopulation Health Care Guide  Direct [phone] [phone] Website: [email or web address]

## 2024-11-08 NOTE — Progress Notes (Signed)
 Complex Care Management Note Care Guide Note  11/08/2024 Name: Yolanda White MRN: [id number] DOB: 1971-07-08  Yolanda White is a 54 y.o. year old female who is a primary care patient of Ziglar, Susan K, MD . The community resource team was consulted for assistance with Food Insecurity  SDOH screenings and interventions completed:  Yes  Social Drivers of Health From This Encounter   Financial Resource Strain: Medium Risk (11/08/2024)   Overall Financial Resource Strain (CARDIA)    Difficulty of Paying Living Expenses: Somewhat hard  Transportation Needs: No Transportation Needs (11/08/2024)   Epic    Lack of Transportation (Medical): No    Lack of Transportation (Non-Medical): No  Utilities: Not At Risk (11/08/2024)   Epic    Threatened with loss of utilities: No    SDOH Interventions Today    Flowsheet Row Most Recent Value  SDOH Interventions   Food Insecurity Interventions AMB Referral  [Food banks provided]  Housing Interventions Community Resources Provided  [told patient to follow up with [place] [facility] and appeal as she is disabled senior housing should be available to her]  Transportation Interventions [employer] Provided, AMB Referral, Payor Benefit  Utilities Interventions --  [no [employer] funds or crisis funds available at this time in [place] but told her how to check]  Financial Strain Interventions [employer] Resources Provided  [place] transportation through [place] /medicare also told of available transportation through cancer center]     Care guide performed the following interventions: Patient provided with information about care guide support team and interviewed to confirm resource needs.  Follow Up Plan:  No further follow up planned at this time. The patient has been provided with needed resources.  Encounter Outcome:  Patient Visit Completed Lanetta Figuero Greenauer-Moran  [facility] HealthPopulation Health Care Guide  Direct  [phone] Fax:[phone] Website: [email or web address]

## 2024-11-12 ENCOUNTER — Other Ambulatory Visit

## 2024-11-12 ENCOUNTER — Ambulatory Visit
Admission: RE | Admit: 2024-11-12 | Discharge: 2024-11-12 | Disposition: A | Discharge status: Home / Self Care | Source: Ambulatory Visit | Attending: Oncology | Admitting: Oncology

## 2024-11-12 DIAGNOSIS — N644 Mastodynia: Secondary | ICD-10-CM | POA: Insufficient documentation

## 2024-11-15 ENCOUNTER — Other Ambulatory Visit: Payer: Self-pay

## 2024-11-15 NOTE — Patient Instructions (Signed)
 Joya CHRISTELLA Burnet - I am sorry I was unable to reach you today for our scheduled appointment. I work with Ziglar, Susan K, MD and am calling to support your healthcare needs. Please contact me at [phone] at your earliest convenience. I look forward to speaking with you soon.   Thank you,  Linette Gunderson  Administrator, [employer] [phone]

## 2024-11-17 ENCOUNTER — Inpatient Hospital Stay: Admitting: Licensed Clinical Social Worker

## 2024-11-17 ENCOUNTER — Inpatient Hospital Stay

## 2024-11-22 ENCOUNTER — Other Ambulatory Visit: Payer: Self-pay

## 2024-11-23 ENCOUNTER — Ambulatory Visit: Payer: Self-pay | Admitting: Oncology

## 2024-11-23 DIAGNOSIS — C50212 Malignant neoplasm of upper-inner quadrant of left female breast: Secondary | ICD-10-CM

## 2024-11-24 NOTE — Telephone Encounter (Signed)
 Spoke to pt and notified her that Mammo results are good and breast pain s common after suregery. Informed her of follow up plan in 1 year.   Please schedule:   1 year:  Bilat screening mammo (order entered)/  MD a week after mammo Keep genetics appt as scheduled

## 2024-11-24 NOTE — Telephone Encounter (Signed)
-----   Message from Zelphia Cap, MD sent at 11/23/2024  9:06 PM EST ----- Please let pt know that mammogram results are good. Breast pain is a common condition after surgery. No mammogram findings of suspicious left breast.  Please arrange her to get bilateral screening mammogram in 1 year prior to MD thanks.

## 2024-11-25 ENCOUNTER — Encounter: Payer: Self-pay | Admitting: Licensed Clinical Social Worker

## 2024-11-25 ENCOUNTER — Inpatient Hospital Stay: Admitting: Licensed Clinical Social Worker

## 2024-11-25 ENCOUNTER — Inpatient Hospital Stay

## 2024-11-25 ENCOUNTER — Other Ambulatory Visit: Payer: Self-pay | Admitting: Licensed Clinical Social Worker

## 2024-11-25 DIAGNOSIS — Z1379 Encounter for other screening for genetic and chromosomal anomalies: Secondary | ICD-10-CM

## 2024-11-25 DIAGNOSIS — C50212 Malignant neoplasm of upper-inner quadrant of left female breast: Secondary | ICD-10-CM | POA: Diagnosis not present

## 2024-11-25 DIAGNOSIS — Z801 Family history of malignant neoplasm of trachea, bronchus and lung: Secondary | ICD-10-CM

## 2024-11-25 LAB — GENETIC SCREENING ORDER

## 2024-11-25 NOTE — Progress Notes (Signed)
 Genetic testing

## 2024-11-25 NOTE — Progress Notes (Signed)
 REFERRING PROVIDER: Babara Call, MD [address] [place],  [place] [address]  PRIMARY PROVIDER:  Onita Devere POUR, MD  PRIMARY REASON FOR VISIT:  1. Malignant neoplasm of upper-inner quadrant of left female breast, unspecified estrogen receptor status (HCC)   2. Family history of lung cancer      HISTORY OF PRESENT ILLNESS:   Yolanda White, a 54 y.o. female, was seen for a [place] cancer genetics consultation at the request of Dr. Babara due to a personal and family history of cancer.  Yolanda White presents to clinic today to discuss the possibility of a hereditary predisposition to cancer, genetic testing, and to further clarify her future cancer risks, as well as potential cancer risks for family members.   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    10/11/2015 Surgery   left breast lumpectomy and SLN biopsy   10/11/2015 Pathology Results   invasive ductal carcinoma, 3cm, LVI(-), margins negative, grade 1, 3 nodes (-)   10/11/2015 Oncotype testing   RS 17 (low risk), which predicts 10 year risk of distant recurrence with tamoxifen   alone 11%   10/11/2015 Pathologic Stage   Stage IIA: T2 N0   12/18/2015 - 02/15/2016 Radiation Therapy   Adjuvant breast radiation: Left breast/ 45 Gy at 1.8 Gy per fraction x 25 fractions.   2. Left breast boost/ 16 Gy at 2 Gy per fraction x 8 fractions   02/2016 -  Anti-estrogen oral therapy   Tamoxifen  20 mg daily. Planned duration of therapy 10 years.   04/25/2016 Survivorship   SCP mailed to patient after missed appointment and request per pt     RELEVANT MEDICAL HISTORY:  Menarche was at age 29-12.  First live birth at age 71.  Ovaries intact: yes.  Hysterectomy: yes.  Colonoscopy: no; Cologuard normal. Mammogram within the last year: yes Had genetic testing in 2016 [facility] panel: no del mut at APC, ATM, BARD1, BMPR1A, BRCA1, BRCA2, BRIP1, CHD1, CDK4, CDKN2A, CHEK2, EPCAM (large rearrangement only), MLH1, MSH2, MSH6, MUTYH, NBN, PALB2, PMS2, PTEN, RAD51C, RAD51D, SMAD4, STK11, and TP53.   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   Depression    Myalgia    Personal history of radiation therapy    Seasonal allergies     Past Surgical  History:  Procedure Laterality Date   ABDOMINAL HYSTERECTOMY     BREAST LUMPECTOMY Left 09/2015   RADIOACTIVE SEED GUIDED PARTIAL MASTECTOMY WITH AXILLARY SENTINEL LYMPH NODE BIOPSY Left 10/11/2015   Procedure: RADIOACTIVE SEED LOCALIZATION LEFT BREAST LUMPECTOMY WITH LEFT  AXILLARY SENTINEL LYMPH NODE BIOPSY;  Surgeon: Morene Olives, MD;  Location: [facility];  Service: General;  Laterality: Left;   TUBAL LIGATION      FAMILY HISTORY:  We obtained a detailed, 4-generation family history.  Significant diagnoses are listed below: Family History  Problem Relation Age of Onset   Diabetes Mother    Diabetes Father    Lung cancer  Father 62       smoker   Asthma Sister    Asthma Brother    Diabetes Brother    Diabetes Brother        maternal half brother   Asthma Brother    Asthma Maternal Grandmother    Heart attack Paternal Grandmother        in her 28s   Asthma Maternal Aunt    Cancer Paternal Aunt        cervical    Father- lung cancer Paternal aunt- cervical cancer Maternal cousin - breast cancer dx 30s-40s Maternal cousin - cancer, unknown type, dx 74s  Yolanda White is unaware of previous family history of genetic testing for hereditary cancer risks. There is no reported Ashkenazi Jewish ancestry. There is no known consanguinity.  GENETIC COUNSELING ASSESSMENT: Yolanda White is a 54 y.o. female with a personal history of breast cancer at 44 which is somewhat suggestive of a hereditary cancer syndrome and predisposition to cancer. We, therefore, discussed and recommended the following at today's visit.   DISCUSSION: We discussed that, in general, most cancer is not inherited in families, but instead is sporadic or familial. We discussed that approximately 10% of breast cancer is hereditary. Most cases of hereditary breast cancer are associated with BRCA1/BRCA2 genes, which she did have previously tested, but there are other genes associated with hereditary cancer as well. Cancers and risks are gene specific. Additionally, testing technology has improved and it is possible we could pick up a mutation in a gene that would have been missed in 2016. We discussed that testing is beneficial for several reasons including knowing about cancer risks, identifying potential screening and risk-reduction options that may be appropriate, and to understand if other family members could be at risk for cancer and allow them to undergo genetic testing.   We reviewed the characteristics, features and inheritance patterns of hereditary cancer syndromes. We also discussed genetic testing, including the appropriate family members to test,  the process of testing, insurance coverage and turn-around-time for results. We discussed the implications of a negative, positive and/or variant of uncertain significant result. We recommended Yolanda White pursue genetic testing for the [facility] Common Hereditary Cancers+RNA gene panel.   The Common Hereditary Cancers Panel + RNA offered by [facility] includes sequencing and/or deletion duplication testing of the following 48 genes: APC*, ATM*, AXIN2, BAP1, BARD1, BMPR1A, BRCA1, BRCA2, BRIP1, CDH1, CDK4, CDKN2A (p14ARF), CDKN2A (p16INK4a), CHEK2, CTNNA1, DICER1*, EPCAM*, FH*, GREM1*, HOXB13, KIT, MBD4, MEN1*, MLH1*, MSH2*, MSH3*, MSH6*, MUTYH, NF1*, NTHL1, PALB2, PDGFRA, PMS2*, POLD1*, POLE, PTEN*, RAD51C, RAD51D, SDHA*, SDHB, SDHC*, SDHD, SMAD4, SMARCA4, STK11, TP53, TSC1*, TSC2, VHL.   Based on Yolanda White's personal history of cancer, she meets medical criteria for genetic testing. Despite that she meets criteria, she may still have an out of pocket cost.  PLAN: After considering the risks, benefits, and limitations, Yolanda White provided informed consent to pursue genetic testing and the blood sample was sent to [facility] for analysis of the Common Hereditary Cancers+RNA Panel. Results should be available within approximately 2-3 weeks' time, at which point they will be disclosed by telephone to Yolanda White, as will any additional recommendations warranted by these results. Yolanda White will receive a summary of her genetic counseling visit and a copy of her results once available. This information will also be available in Epic.   Yolanda White questions were answered to her satisfaction today. Our contact information was provided should additional questions or concerns arise. Thank you for the referral and allowing us  to share in the care of your patient.   Dena Cary, MS, [facility] Genetic Counselor [email or web address] Phone: [phone]  I personally spent a total of 45 minutes in the care  of the patient today including getting/reviewing separately obtained history, counseling and educating, placing orders, and documenting clinical information in the EHR. Dr. Delinda was available for discussion regarding this case.   _______________________________________________________________________ For Office Staff:  Number of people involved in session: 1 Was an Intern/ student involved with case: no

## 2024-11-26 ENCOUNTER — Other Ambulatory Visit: Payer: Self-pay

## 2024-11-26 ENCOUNTER — Other Ambulatory Visit: Payer: Self-pay | Admitting: Family Medicine

## 2024-11-26 DIAGNOSIS — R112 Nausea with vomiting, unspecified: Secondary | ICD-10-CM

## 2024-11-26 NOTE — Patient Instructions (Signed)
 Joya CHRISTELLA Burnet - I have attempted to call you three times but have been unsuccessful in reaching you. I work with Ziglar, Susan K, MD and am calling to support your healthcare needs. If I can be of assistance to you, please contact me at [phone].     Thank you,  Ambry Dix  Administrator, [facility] [phone]

## 2025-12-01 ENCOUNTER — Inpatient Hospital Stay: Admitting: Oncology
